# Patient Record
Sex: Female | Born: 1945
Health system: Southern US, Community
[De-identification: ages and names within clinical notes are randomized; demographics above are authoritative.]

## PROBLEM LIST (undated history)

## (undated) ENCOUNTER — Inpatient Hospital Stay (HOSPITAL_COMMUNITY)

## (undated) DIAGNOSIS — Z860100 Personal history of colon polyps, unspecified: Secondary | ICD-10-CM

## (undated) DIAGNOSIS — K828 Other specified diseases of gallbladder: Secondary | ICD-10-CM

## (undated) DIAGNOSIS — Z5189 Encounter for other specified aftercare: Secondary | ICD-10-CM

## (undated) DIAGNOSIS — K589 Irritable bowel syndrome without diarrhea: Secondary | ICD-10-CM

## (undated) DIAGNOSIS — I700 Atherosclerosis of aorta: Secondary | ICD-10-CM

## (undated) DIAGNOSIS — F32A Depression, unspecified: Secondary | ICD-10-CM

## (undated) DIAGNOSIS — F329 Major depressive disorder, single episode, unspecified: Secondary | ICD-10-CM

## (undated) DIAGNOSIS — G43909 Migraine, unspecified, not intractable, without status migrainosus: Secondary | ICD-10-CM

## (undated) DIAGNOSIS — K648 Other hemorrhoids: Secondary | ICD-10-CM

## (undated) DIAGNOSIS — F419 Anxiety disorder, unspecified: Secondary | ICD-10-CM

## (undated) DIAGNOSIS — K219 Gastro-esophageal reflux disease without esophagitis: Secondary | ICD-10-CM

## (undated) DIAGNOSIS — R51 Headache: Secondary | ICD-10-CM

## (undated) DIAGNOSIS — T4145XA Adverse effect of unspecified anesthetic, initial encounter: Secondary | ICD-10-CM

## (undated) HISTORY — DX: Other specified diseases of gallbladder: K82.8

## (undated) HISTORY — DX: Depression, unspecified: F32.A

## (undated) HISTORY — PX: CHOLECYSTECTOMY: SHX55

## (undated) HISTORY — DX: Atherosclerosis of aorta: I70.0

## (undated) HISTORY — PX: MASTECTOMY: SHX3

## (undated) HISTORY — DX: Migraine, unspecified, not intractable, without status migrainosus: G43.909

## (undated) HISTORY — DX: Other hemorrhoids: K64.8

## (undated) HISTORY — PX: ROTATOR CUFF REPAIR: SHX139

## (undated) HISTORY — DX: Irritable bowel syndrome, unspecified: K58.9

## (undated) HISTORY — PX: OTHER SURGICAL HISTORY: SHX169

## (undated) HISTORY — PX: CATARACT EXTRACTION: SUR2

## (undated) HISTORY — DX: Personal history of colon polyps, unspecified: Z86.0100

## (undated) HISTORY — DX: Anxiety disorder, unspecified: F41.9

## (undated) HISTORY — PX: OVARIAN CYST REMOVAL: SHX89

## (undated) HISTORY — DX: Major depressive disorder, single episode, unspecified: F32.9

## (undated) HISTORY — PX: AUGMENTATION MAMMAPLASTY: SUR837

## (undated) HISTORY — PX: BREAST SURGERY: SHX581

## (undated) HISTORY — PX: EYE MUSCLE SURGERY: SHX370

## (undated) HISTORY — DX: Personal history of colonic polyps: Z86.010

---
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
## 1982-11-08 HISTORY — PX: ABDOMINAL HYSTERECTOMY: SHX81

## 1999-03-04 ENCOUNTER — Other Ambulatory Visit: Admission: RE | Admit: 1999-03-04 | Discharge: 1999-03-04 | Payer: Self-pay | Admitting: Obstetrics and Gynecology

## 2000-03-07 ENCOUNTER — Other Ambulatory Visit: Admission: RE | Admit: 2000-03-07 | Discharge: 2000-03-07 | Payer: Self-pay | Admitting: Obstetrics and Gynecology

## 2000-03-16 ENCOUNTER — Encounter: Admission: RE | Admit: 2000-03-16 | Discharge: 2000-03-16 | Payer: Self-pay | Admitting: Plastic Surgery

## 2000-03-16 ENCOUNTER — Encounter: Payer: Self-pay | Admitting: Plastic Surgery

## 2000-07-12 ENCOUNTER — Encounter (INDEPENDENT_AMBULATORY_CARE_PROVIDER_SITE_OTHER): Payer: Self-pay

## 2000-07-12 ENCOUNTER — Other Ambulatory Visit: Admission: RE | Admit: 2000-07-12 | Discharge: 2000-07-12 | Payer: Self-pay | Admitting: Plastic Surgery

## 2001-03-13 ENCOUNTER — Other Ambulatory Visit: Admission: RE | Admit: 2001-03-13 | Discharge: 2001-03-13 | Payer: Self-pay | Admitting: Obstetrics and Gynecology

## 2001-03-20 ENCOUNTER — Encounter: Payer: Self-pay | Admitting: Obstetrics and Gynecology

## 2001-03-20 ENCOUNTER — Encounter: Admission: RE | Admit: 2001-03-20 | Discharge: 2001-03-20 | Payer: Self-pay | Admitting: Obstetrics and Gynecology

## 2001-04-13 ENCOUNTER — Encounter: Admission: RE | Admit: 2001-04-13 | Discharge: 2001-04-13 | Payer: Self-pay | Admitting: Obstetrics and Gynecology

## 2001-04-13 ENCOUNTER — Encounter: Payer: Self-pay | Admitting: Obstetrics and Gynecology

## 2001-04-26 ENCOUNTER — Encounter: Admission: RE | Admit: 2001-04-26 | Discharge: 2001-04-26 | Payer: Self-pay | Admitting: Surgery

## 2001-04-26 ENCOUNTER — Encounter: Payer: Self-pay | Admitting: Surgery

## 2001-05-04 ENCOUNTER — Encounter: Admission: RE | Admit: 2001-05-04 | Discharge: 2001-05-04 | Payer: Self-pay | Admitting: Surgery

## 2001-05-04 ENCOUNTER — Encounter: Payer: Self-pay | Admitting: Surgery

## 2002-06-18 ENCOUNTER — Encounter: Admission: RE | Admit: 2002-06-18 | Discharge: 2002-06-18 | Payer: Self-pay | Admitting: Obstetrics and Gynecology

## 2002-06-18 ENCOUNTER — Encounter: Payer: Self-pay | Admitting: Obstetrics and Gynecology

## 2003-05-20 ENCOUNTER — Encounter: Payer: Self-pay | Admitting: Obstetrics and Gynecology

## 2003-05-20 ENCOUNTER — Encounter: Admission: RE | Admit: 2003-05-20 | Discharge: 2003-05-20 | Payer: Self-pay | Admitting: Obstetrics and Gynecology

## 2003-08-12 ENCOUNTER — Encounter: Admission: RE | Admit: 2003-08-12 | Discharge: 2003-08-12 | Payer: Self-pay | Admitting: Obstetrics and Gynecology

## 2003-08-12 ENCOUNTER — Encounter: Payer: Self-pay | Admitting: Obstetrics and Gynecology

## 2004-08-17 ENCOUNTER — Encounter: Admission: RE | Admit: 2004-08-17 | Discharge: 2004-08-17 | Payer: Self-pay | Admitting: Obstetrics and Gynecology

## 2004-11-08 DIAGNOSIS — T8859XA Other complications of anesthesia, initial encounter: Secondary | ICD-10-CM

## 2004-11-08 HISTORY — DX: Other complications of anesthesia, initial encounter: T88.59XA

## 2004-11-08 HISTORY — PX: KNEE ARTHROPLASTY: SHX992

## 2005-08-23 ENCOUNTER — Encounter: Admission: RE | Admit: 2005-08-23 | Discharge: 2005-08-23 | Payer: Self-pay | Admitting: Plastic Surgery

## 2006-05-27 ENCOUNTER — Encounter: Admission: RE | Admit: 2006-05-27 | Discharge: 2006-05-27 | Payer: Self-pay | Admitting: Plastic Surgery

## 2007-07-12 ENCOUNTER — Encounter: Admission: RE | Admit: 2007-07-12 | Discharge: 2007-07-12 | Payer: Self-pay | Admitting: Plastic Surgery

## 2008-07-16 ENCOUNTER — Encounter: Admission: RE | Admit: 2008-07-16 | Discharge: 2008-07-16 | Payer: Self-pay | Admitting: Obstetrics and Gynecology

## 2009-07-23 ENCOUNTER — Encounter: Admission: RE | Admit: 2009-07-23 | Discharge: 2009-07-23 | Payer: Self-pay | Admitting: Specialist

## 2009-08-22 ENCOUNTER — Encounter: Admission: RE | Admit: 2009-08-22 | Discharge: 2009-08-22 | Payer: Self-pay | Admitting: Plastic Surgery

## 2010-08-12 ENCOUNTER — Encounter: Admission: RE | Admit: 2010-08-12 | Discharge: 2010-08-12 | Payer: Self-pay | Admitting: Obstetrics and Gynecology

## 2010-12-30 HISTORY — PX: HERNIA REPAIR: SHX51

## 2011-06-10 ENCOUNTER — Encounter (HOSPITAL_COMMUNITY): Payer: Self-pay

## 2011-06-10 ENCOUNTER — Other Ambulatory Visit: Payer: Self-pay

## 2011-06-10 ENCOUNTER — Encounter (HOSPITAL_COMMUNITY)
Admission: RE | Admit: 2011-06-10 | Discharge: 2011-06-10 | Disposition: A | Discharge status: Home / Self Care | Source: Ambulatory Visit | Attending: Obstetrics and Gynecology | Admitting: Obstetrics and Gynecology

## 2011-06-10 HISTORY — DX: Adverse effect of unspecified anesthetic, initial encounter: T41.45XA

## 2011-06-10 HISTORY — DX: Gastro-esophageal reflux disease without esophagitis: K21.9

## 2011-06-10 HISTORY — DX: Encounter for other specified aftercare: Z51.89

## 2011-06-10 LAB — CBC
HCT: 39.5 % (ref 36.0–46.0)
Hemoglobin: 12.7 g/dL (ref 12.0–15.0)
MCH: 30.8 pg (ref 26.0–34.0)
MCHC: 32.2 g/dL (ref 30.0–36.0)
MCV: 95.6 fL (ref 78.0–100.0)
Platelets: 170 10*3/uL (ref 150–400)
RBC: 4.13 MIL/uL (ref 3.87–5.11)
RDW: 12.7 % (ref 11.5–15.5)
WBC: 3.9 10*3/uL — ABNORMAL LOW (ref 4.0–10.5)

## 2011-06-10 NOTE — Anesthesia Preprocedure Evaluation (Addendum)
Anesthesia Evaluation  Name, MR# and DOB Patient awake  General Assessment Comment  Reviewed: Allergy & Precautions, H&P , Patient's Chart, lab work & pertinent test results  History of Anesthesia Complications History of anesthetic complications: Hypotenstion during procedure attributed to "too much anesthesia"  Airway Mallampati: II TM Distance: >3 FB Neck ROM: Full    Dental No notable dental hx. (+) Upper Dentures and Lower Dentures   Pulmonary  clear to auscultation  pulmonary exam normalPulmonary Exam Normal breath sounds clear to auscultation none    Cardiovascular Regular Normal    Neuro/Psych Negative Neurological ROS  Negative Psych ROS  GI/Hepatic/Renal negative GI ROS, negative Liver ROS, and negative Renal ROS (+)  GERD Controlled     Endo/Other  Negative Endocrine ROS (+)      Abdominal Normal abdominal exam  (+)   Musculoskeletal negative musculoskeletal ROS (+)   Hematology negative hematology ROS (+)   Peds  Reproductive/Obstetrics negative OB ROS    Anesthesia Other Findings             Anesthesia Physical Anesthesia Plan  ASA: II  Anesthesia Plan: General   Post-op Pain Management:    Induction: Intravenous  Airway Management Planned: Oral ETT  Additional Equipment:   Intra-op Plan:   Post-operative Plan: Extubation in OR  Informed Consent: I have reviewed the patients History and Physical, chart, labs and discussed the procedure including the risks, benefits and alternatives for the proposed anesthesia with the patient or authorized representative who has indicated his/her understanding and acceptance.   Dental advisory given  Plan Discussed with: Anesthesiologist (AP)  Anesthesia Plan Comments:         Anesthesia Quick Evaluation

## 2011-06-10 NOTE — Patient Instructions (Addendum)
20 TILIA FASO  06/10/2011   Your procedure is scheduled on:  06/23/11  Report to [facility] at 7 AM.  Call this number if you have problems the morning of surgery: [phone]   Remember:   Do not eat food:After Midnight.  Do not drink clear liquids: After Midnight.  Take these medicines the morning of surgery with A SIP OF WATER: Previcid    Do not wear jewelry, make-up or nail polish.  Do not wear lotions, powders, or perfumes. You may wear deodorant.  Do not shave 48 hours prior to surgery.  Do not bring valuables to the hospital.  Contacts, dentures or bridgework may not be worn into surgery.  Leave suitcase in the car. After surgery it may be brought to your room.  For patients admitted to the hospital, checkout time is 11:00 AM the day of discharge.   Patients discharged the day of surgery will not be allowed to drive home.  Name and phone number of your driver: NA  Special Instructions: use CHG wash per written instruction sheet.   Please read over the following fact sheets that you were given: After Surgery Info.

## 2011-06-22 MED ORDER — CLINDAMYCIN PHOSPHATE 900 MG/50ML IV SOLN
900.0000 mg | INTRAVENOUS | Status: DC
  Filled 2011-06-22: qty 50

## 2011-06-22 MED ORDER — CIPROFLOXACIN IN D5W 400 MG/200ML IV SOLN
400.0000 mg | INTRAVENOUS | Status: DC
  Filled 2011-06-22: qty 200

## 2011-06-22 NOTE — H&P (Signed)
65 y.o. yo complains of SUI and symptomatic cystocele.  The vaginal prolapse is recurrent.  She desires definitve therapy.  Past Medical History  Diagnosis Date  . Complication of anesthesia 2006    Drop in  Bp intra-op with Lap Chole. [employer].  Marland Kitchen GERD (gastroesophageal reflux disease)   . Blood transfusion without reported diagnosis 1984 x7    [facility]  . Blood transfusion     Blood bank notified Shelly Flatten)   Past Surgical History  Procedure Date  . Ovarian cyst removal     bilateral  . Cholecystectomy   . Rotator cuff repair   . Hernia repair   . Eye muscle surgery as child  . Appendectomy 1967  . Breast surgery     implants due to removal of breast tissue  . Abdominal hysterectomy 1984  . Knee arthroplasty 2006  Left inguinal hernia repair:  Pt had a surgimesh graft placed in the internal inguinal ring and then an onlay over this.      History   Social History  . Marital Status: Married    Spouse Name: N/A    Number of Children: N/A  . Years of Education: N/A   Occupational History  . Not on file.   Social History Main Topics  . Smoking status: Former Games developer  . Smokeless tobacco: Never Used  . Alcohol Use: No  . Drug Use: No  . Sexually Active:    Other Topics Concern  . Not on file   Social History Narrative  . No narrative on file    No current facility-administered medications on file prior to encounter.   No current outpatient prescriptions on file prior to encounter.    Allergies  Allergen Reactions  . Codeine Nausea Only    @VITALS2 @  Lungs: clear to ascultation Cor:  RRR Abdomen:  soft, nontender, nondistended. Ex:  no cords, erythema Pelvic:  Atrophic, vag vault to -3 with decensus.  Cystocele to introitus and beyond to +1.  No masses or rectocele.  Cystometrics:  nml cap, nml flow, VLPP 129 cm H20, detrusor stable.  A:  65 yo with genuine stress incontinence and symptomatic recurrent cystocele.  P:  She desires definitive  therapy with a TVT, cystoscopy, A-repair and possible Xeroform graft.   All risks, benefits and alternatives d/w patient and she will receive preop antibiotics and SCDs during the operation.     Cove Haydon A

## 2011-06-23 ENCOUNTER — Encounter (HOSPITAL_COMMUNITY): Payer: Self-pay | Admitting: Anesthesiology

## 2011-06-23 ENCOUNTER — Encounter (HOSPITAL_COMMUNITY)
Admission: AD | Disposition: A | Discharge status: Home / Self Care | Payer: Self-pay | Source: Ambulatory Visit | Attending: Obstetrics and Gynecology

## 2011-06-23 ENCOUNTER — Ambulatory Visit (HOSPITAL_COMMUNITY): Admitting: Anesthesiology

## 2011-06-23 ENCOUNTER — Ambulatory Visit (HOSPITAL_COMMUNITY)
Admission: AD | Admit: 2011-06-23 | Discharge: 2011-06-24 | Disposition: A | Discharge status: Home / Self Care | Source: Ambulatory Visit | Attending: Obstetrics and Gynecology | Admitting: Obstetrics and Gynecology

## 2011-06-23 DIAGNOSIS — N393 Stress incontinence (female) (male): Secondary | ICD-10-CM | POA: Insufficient documentation

## 2011-06-23 DIAGNOSIS — Z01812 Encounter for preprocedural laboratory examination: Secondary | ICD-10-CM | POA: Insufficient documentation

## 2011-06-23 DIAGNOSIS — Z01818 Encounter for other preprocedural examination: Secondary | ICD-10-CM | POA: Insufficient documentation

## 2011-06-23 DIAGNOSIS — N993 Prolapse of vaginal vault after hysterectomy: Secondary | ICD-10-CM | POA: Insufficient documentation

## 2011-06-23 HISTORY — PX: CYSTOCELE REPAIR: SHX163

## 2011-06-23 HISTORY — PX: CYSTOSCOPY: SHX5120

## 2011-06-23 HISTORY — PX: BLADDER SUSPENSION: SHX72

## 2011-06-23 SURGERY — URETHROPEXY, USING TRANSVAGINAL TAPE
Anesthesia: General | Site: Vagina | Wound class: Clean Contaminated

## 2011-06-23 MED ORDER — MENTHOL 3 MG MT LOZG: 1.0000 | LOZENGE | OROMUCOSAL | Status: DC | PRN

## 2011-06-23 MED ORDER — VENLAFAXINE HCL ER 150 MG PO CP24
150.0000 mg | ORAL_CAPSULE | ORAL | Status: DC
  Administered 2011-06-24: 150 mg via ORAL
  Filled 2011-06-23 (×3): qty 1

## 2011-06-23 MED ORDER — GLYCOPYRROLATE 0.2 MG/ML IJ SOLN
INTRAMUSCULAR | Status: AC
  Filled 2011-06-23: qty 1

## 2011-06-23 MED ORDER — SODIUM CHLORIDE 0.9 % IV SOLN
INTRAVENOUS | Status: DC | PRN
  Administered 2011-06-23: 08:00:00 via INTRAVENOUS

## 2011-06-23 MED ORDER — FENTANYL CITRATE 0.05 MG/ML IJ SOLN
INTRAMUSCULAR | Status: AC
  Administered 2011-06-23: 25 ug via INTRAVENOUS
  Filled 2011-06-23: qty 2

## 2011-06-23 MED ORDER — DEXTROSE IN LACTATED RINGERS 5 % IV SOLN
INTRAVENOUS | Status: DC | PRN
  Administered 2011-06-23: 10:00:00 via INTRAVENOUS

## 2011-06-23 MED ORDER — DEXAMETHASONE SODIUM PHOSPHATE 4 MG/ML IJ SOLN
INTRAMUSCULAR | Status: DC | PRN
  Administered 2011-06-23: 10 mg via INTRAVENOUS

## 2011-06-23 MED ORDER — INDIGOTINDISULFONATE SODIUM 8 MG/ML IJ SOLN
INTRAMUSCULAR | Status: DC | PRN
  Administered 2011-06-23: 4 mg via INTRAVENOUS

## 2011-06-23 MED ORDER — ROCURONIUM BROMIDE 100 MG/10ML IV SOLN
INTRAVENOUS | Status: DC | PRN
  Administered 2011-06-23: 35 mg via INTRAVENOUS

## 2011-06-23 MED ORDER — SODIUM CHLORIDE 0.9 % IV SOLN
250.0000 mL | INTRAVENOUS | Status: DC
  Administered 2011-06-23: 250 mL via INTRAVENOUS

## 2011-06-23 MED ORDER — STERILE WATER FOR IRRIGATION IR SOLN
Status: DC | PRN
  Administered 2011-06-23: 1000 mL via INTRAVESICAL

## 2011-06-23 MED ORDER — LACTATED RINGERS IV SOLN
INTRAVENOUS | Status: DC | PRN
  Administered 2011-06-23 (×2): via INTRAVENOUS

## 2011-06-23 MED ORDER — EPHEDRINE 5 MG/ML INJ
INTRAVENOUS | Status: AC
  Filled 2011-06-23: qty 10

## 2011-06-23 MED ORDER — FENTANYL CITRATE 0.05 MG/ML IJ SOLN
INTRAMUSCULAR | Status: DC | PRN
  Administered 2011-06-23: 100 ug via INTRAVENOUS
  Administered 2011-06-23: 50 ug via INTRAVENOUS

## 2011-06-23 MED ORDER — LACTATED RINGERS IV SOLN
INTRAVENOUS | Status: DC
  Administered 2011-06-23: 08:00:00 via INTRAVENOUS

## 2011-06-23 MED ORDER — METOCLOPRAMIDE HCL 5 MG/ML IJ SOLN
10.0000 mg | Freq: Once | INTRAMUSCULAR | Status: AC | PRN
  Administered 2011-06-23: 10 mg via INTRAVENOUS

## 2011-06-23 MED ORDER — METOCLOPRAMIDE HCL 5 MG/ML IJ SOLN
INTRAMUSCULAR | Status: AC
  Administered 2011-06-23: 10 mg via INTRAVENOUS
  Filled 2011-06-23: qty 2

## 2011-06-23 MED ORDER — ONDANSETRON HCL 4 MG/2ML IJ SOLN
INTRAMUSCULAR | Status: DC | PRN
  Administered 2011-06-23: 4 mg via INTRAVENOUS

## 2011-06-23 MED ORDER — DEXAMETHASONE SODIUM PHOSPHATE 10 MG/ML IJ SOLN
INTRAMUSCULAR | Status: AC
  Filled 2011-06-23: qty 1

## 2011-06-23 MED ORDER — LIDOCAINE HCL (CARDIAC) 20 MG/ML IV SOLN
INTRAVENOUS | Status: AC
  Filled 2011-06-23: qty 5

## 2011-06-23 MED ORDER — PHENAZOPYRIDINE HCL 100 MG PO TABS
200.0000 mg | ORAL_TABLET | Freq: Three times a day (TID) | ORAL | Status: DC | PRN
Start: 2011-06-23 — End: 2011-06-24
  Administered 2011-06-23 – 2011-06-24 (×3): 200 mg via ORAL
  Filled 2011-06-23: qty 2

## 2011-06-23 MED ORDER — CLINDAMYCIN PHOSPHATE 600 MG/50ML IV SOLN
INTRAVENOUS | Status: DC | PRN
  Administered 2011-06-23: 900 mg via INTRAVENOUS

## 2011-06-23 MED ORDER — IBUPROFEN 800 MG PO TABS
800.0000 mg | ORAL_TABLET | Freq: Three times a day (TID) | ORAL | Status: DC | PRN
  Administered 2011-06-24: 800 mg via ORAL
  Filled 2011-06-23: qty 1

## 2011-06-23 MED ORDER — SODIUM CHLORIDE 0.9 % IJ SOLN: 3.0000 mL | Freq: Two times a day (BID) | INTRAMUSCULAR | Status: DC

## 2011-06-23 MED ORDER — ONDANSETRON HCL 4 MG/2ML IJ SOLN
INTRAMUSCULAR | Status: AC
  Filled 2011-06-23: qty 2

## 2011-06-23 MED ORDER — OXYCODONE-ACETAMINOPHEN 5-325 MG PO TABS
1.0000 | ORAL_TABLET | ORAL | Status: DC | PRN
  Administered 2011-06-24: 1 via ORAL
  Administered 2011-06-24: 2 via ORAL
  Filled 2011-06-23: qty 1
  Filled 2011-06-23: qty 2

## 2011-06-23 MED ORDER — CIPROFLOXACIN IN D5W 400 MG/200ML IV SOLN
INTRAVENOUS | Status: DC | PRN
  Administered 2011-06-23: 400 mg via INTRAVENOUS

## 2011-06-23 MED ORDER — PANTOPRAZOLE SODIUM 40 MG PO TBEC
40.0000 mg | DELAYED_RELEASE_TABLET | Freq: Every day | ORAL | Status: DC
  Administered 2011-06-24: 40 mg via ORAL
  Filled 2011-06-23 (×3): qty 1

## 2011-06-23 MED ORDER — SODIUM CHLORIDE 0.9 % IV SOLN: INTRAVENOUS | Status: DC

## 2011-06-23 MED ORDER — ROCURONIUM BROMIDE 50 MG/5ML IV SOLN
INTRAVENOUS | Status: AC
  Filled 2011-06-23: qty 1

## 2011-06-23 MED ORDER — EPHEDRINE SULFATE 50 MG/ML IJ SOLN
INTRAMUSCULAR | Status: DC | PRN
  Administered 2011-06-23 (×2): 10 mg via INTRAVENOUS

## 2011-06-23 MED ORDER — FENTANYL CITRATE 0.05 MG/ML IJ SOLN
INTRAMUSCULAR | Status: AC
  Filled 2011-06-23: qty 5

## 2011-06-23 MED ORDER — ONDANSETRON HCL 4 MG PO TABS: 4.0000 mg | ORAL_TABLET | Freq: Four times a day (QID) | ORAL | Status: DC | PRN

## 2011-06-23 MED ORDER — ESTRADIOL 0.1 MG/GM VA CREA
TOPICAL_CREAM | VAGINAL | Status: DC | PRN
  Administered 2011-06-23: 1 via VAGINAL

## 2011-06-23 MED ORDER — MIDAZOLAM HCL 2 MG/2ML IJ SOLN
INTRAMUSCULAR | Status: AC
  Filled 2011-06-23: qty 2

## 2011-06-23 MED ORDER — PROPOFOL 10 MG/ML IV EMUL
INTRAVENOUS | Status: DC | PRN
  Administered 2011-06-23: 150 mg via INTRAVENOUS

## 2011-06-23 MED ORDER — PROPOFOL 10 MG/ML IV EMUL
INTRAVENOUS | Status: AC
  Filled 2011-06-23: qty 20

## 2011-06-23 MED ORDER — LIDOCAINE HCL (CARDIAC) 20 MG/ML IV SOLN
INTRAVENOUS | Status: DC | PRN
  Administered 2011-06-23 (×2): 30 mg via INTRAVENOUS

## 2011-06-23 MED ORDER — ZOLPIDEM TARTRATE 5 MG PO TABS: 5.0000 mg | ORAL_TABLET | Freq: Every evening | ORAL | Status: DC | PRN

## 2011-06-23 MED ORDER — LIDOCAINE-EPINEPHRINE 0.5-1:200000 % IJ SOLN
INTRAMUSCULAR | Status: DC | PRN
  Administered 2011-06-23: 4 mL via INTRADERMAL

## 2011-06-23 MED ORDER — SODIUM CHLORIDE 0.9 % IV SOLN: 250.0000 mL | INTRAVENOUS | Status: DC

## 2011-06-23 MED ORDER — MEPERIDINE HCL 25 MG/ML IJ SOLN: 6.2500 mg | INTRAMUSCULAR | Status: DC | PRN

## 2011-06-23 MED ORDER — FENTANYL CITRATE 0.05 MG/ML IJ SOLN
25.0000 ug | INTRAMUSCULAR | Status: DC | PRN
  Administered 2011-06-23 (×3): 25 ug via INTRAVENOUS

## 2011-06-23 MED ORDER — NEOSTIGMINE METHYLSULFATE 1 MG/ML IJ SOLN
INTRAMUSCULAR | Status: AC
  Filled 2011-06-23: qty 10

## 2011-06-23 MED ORDER — GLYCOPYRROLATE 0.2 MG/ML IJ SOLN
INTRAMUSCULAR | Status: DC | PRN
  Administered 2011-06-23: 0.3 mg via INTRAVENOUS

## 2011-06-23 MED ORDER — ONDANSETRON HCL 4 MG/2ML IJ SOLN: 4.0000 mg | Freq: Four times a day (QID) | INTRAMUSCULAR | Status: DC | PRN

## 2011-06-23 MED ORDER — MIDAZOLAM HCL 5 MG/5ML IJ SOLN
INTRAMUSCULAR | Status: DC | PRN
  Administered 2011-06-23: 2 mg via INTRAVENOUS

## 2011-06-23 MED ORDER — KETOROLAC TROMETHAMINE 30 MG/ML IJ SOLN
30.0000 mg | Freq: Four times a day (QID) | INTRAMUSCULAR | Status: DC
  Administered 2011-06-23 (×2): 30 mg via INTRAVENOUS
  Filled 2011-06-23 (×3): qty 1

## 2011-06-23 MED ORDER — NEOSTIGMINE METHYLSULFATE 1 MG/ML IJ SOLN
INTRAMUSCULAR | Status: DC | PRN
  Administered 2011-06-23: 4 mg via INTRAMUSCULAR

## 2011-06-23 SURGICAL SUPPLY — 36 items
BLADE SURG 15 STRL LF C SS BP (BLADE) ×2 IMPLANT
BLADE SURG 15 STRL SS (BLADE) ×3
CANISTER SUCTION 2500CC (MISCELLANEOUS) ×3 IMPLANT
CATH BONANNO SUPRAPUBIC 14G (CATHETERS) IMPLANT
CLOTH BEACON ORANGE TIMEOUT ST (SAFETY) ×3 IMPLANT
CONT PATH 16OZ SNAP LID 3702 (MISCELLANEOUS) IMPLANT
DECANTER SPIKE VIAL GLASS SM (MISCELLANEOUS) IMPLANT
DERMABOND ADVANCED (GAUZE/BANDAGES/DRESSINGS) ×3 IMPLANT
DRAPE CAMERA CLOSED 9X96 (DRAPES) ×3 IMPLANT
DRAPE UTILITY XL STRL (DRAPES) ×3 IMPLANT
GAUZE PACKING 2X5 YD STERILE (GAUZE/BANDAGES/DRESSINGS) ×3 IMPLANT
GLOVE BIO SURGEON STRL SZ7 (GLOVE) ×6 IMPLANT
GOWN PREVENTION PLUS LG XLONG (DISPOSABLE) ×12 IMPLANT
NDL SPNL 22GX3.5 QUINCKE BK (NEEDLE) IMPLANT
NEEDLE HYPO 22GX1.5 SAFETY (NEEDLE) ×3 IMPLANT
NEEDLE SPNL 22GX3.5 QUINCKE BK (NEEDLE) IMPLANT
NS IRRIG 1000ML POUR BTL (IV SOLUTION) ×3 IMPLANT
PACK VAGINAL WOMENS (CUSTOM PROCEDURE TRAY) ×3 IMPLANT
PLUG CATH AND CAP STER (CATHETERS) ×3 IMPLANT
SET CYSTO W/LG BORE CLAMP LF (SET/KITS/TRAYS/PACK) ×3 IMPLANT
SLING TRANS VAGINAL TAPE (Sling) ×1 IMPLANT
SLING UTERINE/ABD GYNECARE TVT (Sling) ×2 IMPLANT
SURGIFLO TRUKIT (HEMOSTASIS) ×1 IMPLANT
SUT VIC AB 0 CT1 18XCR BRD8 (SUTURE) ×2 IMPLANT
SUT VIC AB 0 CT1 27 (SUTURE) ×6
SUT VIC AB 0 CT1 27XBRD ANBCTR (SUTURE) ×4 IMPLANT
SUT VIC AB 0 CT1 8-18 (SUTURE) ×3
SUT VIC AB 2-0 CT1 27 (SUTURE) ×12
SUT VIC AB 2-0 CT1 TAPERPNT 27 (SUTURE) ×8 IMPLANT
SUT VIC AB 2-0 SH 27 (SUTURE) ×9
SUT VIC AB 2-0 SH 27XBRD (SUTURE) ×6 IMPLANT
SUT VIC AB 2-0 UR6 27 (SUTURE) IMPLANT
SYR 50ML LL SCALE MARK (SYRINGE) IMPLANT
TOWEL OR 17X24 6PK STRL BLUE (TOWEL DISPOSABLE) ×6 IMPLANT
TRAY FOLEY CATH 14FR (SET/KITS/TRAYS/PACK) ×3 IMPLANT
WATER STERILE IRR 1000ML POUR (IV SOLUTION) ×3 IMPLANT

## 2011-06-23 NOTE — Progress Notes (Signed)
Patient is eating, ambulating, not voiding-foley is in.  Pain control is good.  BP 91/74  Pulse 80  Temp(Src) 97.4 F (36.3 C) (Oral)  Resp 16  SpO2 95%  lungs:   clear to auscultation cor:    RRR Abdomen:  soft, appropriate tenderness, incisions intact and without erythema or exudate. ex:    no cords   Lab Results  Component Value Date   WBC 3.9* 06/10/2011   HGB 12.7 06/10/2011   HCT 39.5 06/10/2011   MCV 95.6 06/10/2011   PLT 170 06/10/2011    A/P  Routine care.  Expect d/c per plan.  Vag pack in o/n.  Keep foley in.

## 2011-06-23 NOTE — Brief Op Note (Signed)
06/23/2011  9:34 AM  PATIENT:  Rebecca [name]  65 y.o. female  PRE-OPERATIVE DIAGNOSIS:  stress urinary incontinence, cystocele  POST-OPERATIVE DIAGNOSIS:  stress urinary incontinence, cystocele  PROCEDURE:  Procedure(s): TRANSVAGINAL TAPE (TVT) PROCEDURE ANTERIOR REPAIR (CYSTOCELE) CYSTOSCOPY  SURGEON:  Surgeon(s): Marcelino Duster A Dixie Coppa Brook A Edward Jolly  PHYSICIAN ASSISTANT:   ASSISTANTSEdward Jolly   ANESTHESIA:   general  ESTIMATED BLOOD LOSS:  150 cc.  IVF:  1300 cc.  UOP:  50 cc.   BLOOD ADMINISTERED:none  LOCAL MEDICATIONS USED:  LIDOCAINE 10CC, Surgiflow  OTHER MEDICATIONS:  Indigo carmine  SPECIMEN:  No Specimen  DISPOSITION OF SPECIMEN:  N/A  COUNTS:  YES  FINDINGS:  Apex of vaginal well suspended and found no need for xeroform graft.  Cystocele to hymen (0 degree).  Good spill of indigo carmine from ureteral orifices.  No needles in bladder.  TECHNIQUE:  After general anesthesia was administered, the patient was prepped and draped in the usual sterile fashion.  A foley was placed in the urethra and the apex of the cystocele grasped with two allises.  Allises were used to grasp the vaginal mucosa in the midline superior to inferior just below the urethral opening.  The mucosa was injected with 1% lidocaine with epi in the midline and a scalpel used to incise the vaginal mucosa vertically.  Allises were used to retract the vaginal mucosa as the vesico-vaginal fascia was removed from the mucosa with blunt and sharp dissection.  Once adequate room was achieved, two stitches were placed on either side up into the corners near the pelvic floor to help with hemostasis.  Two small puncture incisions were then made two cm from midline just above the pubic symphysis.  The abdominal needles from the Gynecare TVT set were introduced behind the pubic bone, through the pelvic floor and out the vagina carefully on each respective side, being careful to hug the posterior of the pubic  bone.  The foley was removed and indigo carmine given.  Cystoscopy revealed excellent bilateral spill from each ureteral orifice and NO NEEDLES IN THE BLADDER.   The urethra was clear as well.  The foley was replaced and the vaginal needles attached to the abdominal needles.  The tape was pulled into place through the abdominal incisions, the sheaths removed while a kelly was in place under the mesh sling and the tape cut at the level of just below the skin.  The tape was checked and found to be tight enough and laying flat.  Surgiflow was placed in the corners up by the pelvic floor to achieve hemostasis of some small bleeders out of reach and too close to the sling for stitches.  Once hemostasis was achieved, two mattress stitches of 0-vicryl were placed to close the vesico-vaginal fascia under the bladder.  The vaginal mucosa was trimmed and then closed with a running lock stitch of 2-0 vicryl.  A vaginal packing with estrace was placed and the patient returned to the recovery room in stable condition.     PLAN OF CARE: To PACU in stable condition.  PATIENT DISPOSITION:  PACU - hemodynamically stable.   Delay start of Pharmacological VTE agent (>24hrs) due to surgical blood loss or risk of bleeding:  not applicable  Keashia Haskins A

## 2011-06-23 NOTE — Anesthesia Postprocedure Evaluation (Signed)
  Anesthesia Post-op Note  Patient: Rebecca [name]  Procedure(s) Performed:  TRANSVAGINAL TAPE (TVT) PROCEDURE; ANTERIOR REPAIR (CYSTOCELE); CYSTOSCOPY  Patient Location: PACU  Anesthesia Type: General  Level of Consciousness: awake, alert  and oriented  Airway and Oxygen Therapy: Patient Spontanous Breathing  Post-op Pain: none  Post-op Assessment: Post-op Vital signs reviewed, Patient's Cardiovascular Status Stable, Respiratory Function Stable, Patent Airway, No signs of Nausea or vomiting and Pain level controlled  Post-op Vital Signs: Reviewed  Complications: No apparent anesthesia complications

## 2011-06-23 NOTE — Preoperative (Signed)
Beta Blockers   Reason not to administer Beta Blockers:Not Applicable 

## 2011-06-23 NOTE — Anesthesia Procedure Notes (Addendum)
Performed by: Suella Grove   Procedure Name: Intubation Performed by: Suella Grove Pre-anesthesia Checklist: Patient identified, Patient being monitored, Emergency Drugs available, Timeout performed and Suction available Patient Re-evaluated:Patient Re-evaluated prior to inductionOxygen Delivery Method: Circle System Utilized Preoxygenation: Pre-oxygenation with 100% oxygen Ventilation: Mask ventilation without difficulty Laryngoscope Size: Mac and 3 Grade View: Grade I Tube type: Oral Tube size: 7.0 mm Number of attempts: 1 Placement Confirmation: ETT inserted through vocal cords under direct vision,  breath sounds checked- equal and bilateral and positive ETCO2 Secured at: 20 cm Tube secured with: Tape Dental Injury: Teeth and Oropharynx as per pre-operative assessment

## 2011-06-23 NOTE — Transfer of Care (Signed)
Immediate Anesthesia Transfer of Care Note  Patient: Rebecca [name]  Procedure(s) Performed:  TRANSVAGINAL TAPE (TVT) PROCEDURE; ANTERIOR REPAIR (CYSTOCELE); CYSTOSCOPY  Patient Location: PACU  Anesthesia Type: General  Level of Consciousness: awake, pateint uncooperative, confused and responds to stimulation  Airway & Oxygen Therapy: Patient Spontanous Breathing and Patient connected to nasal cannula oxygen  Post-op Assessment: Report given to PACU RN, Post -op Vital signs reviewed and stable and Patient moving all extremities  Post vital signs: Reviewed and stable  Complications: No apparent anesthesia complications

## 2011-06-23 NOTE — Progress Notes (Signed)
There has been no change in the patients history or status since the history and physical.  Filed Vitals:   06/23/11 0700  BP: 136/54  Pulse: 70  Temp: 98.1 F (36.7 C)  TempSrc: Oral  Resp: 18  SpO2: 100%    Lab Results  Component Value Date   WBC 3.9* 06/10/2011   HGB 12.7 06/10/2011   HCT 39.5 06/10/2011   MCV 95.6 06/10/2011   PLT 170 06/10/2011    Samone Guhl A

## 2011-06-24 LAB — CBC
HCT: 28.5 % — ABNORMAL LOW (ref 36.0–46.0)
Hemoglobin: 9.4 g/dL — ABNORMAL LOW (ref 12.0–15.0)
MCV: 94.7 fL (ref 78.0–100.0)
RBC: 3.01 MIL/uL — ABNORMAL LOW (ref 3.87–5.11)
WBC: 11.4 10*3/uL — ABNORMAL HIGH (ref 4.0–10.5)

## 2011-06-24 MED ORDER — OXYCODONE-ACETAMINOPHEN 5-325 MG PO TABS: 1.0000 | ORAL_TABLET | ORAL | Status: AC | PRN

## 2011-06-24 MED ORDER — CEFUROXIME AXETIL 250 MG PO TABS: 250.0000 mg | ORAL_TABLET | Freq: Two times a day (BID) | ORAL | Status: AC

## 2011-06-24 NOTE — Progress Notes (Addendum)
Patient is eating, ambulating, not voiding-foley to remain in.  Pain control is good.  BP 91/54  Pulse 81  Temp(Src) 98.1 F (36.7 C) (Oral)  Resp 18  SpO2 95%  lungs:   clear to auscultation cor:    RRR Abdomen:  soft, appropriate tenderness, incisions intact and without erythema or exudate. ex:    no cords  Vag Pack OUT  Lab Results  Component Value Date   WBC 11.4* 06/24/2011   HGB 9.4* 06/24/2011   HCT 28.5* 06/24/2011   MCV 94.7 06/24/2011   PLT 138* 06/24/2011    A/P  Routine care.  Expect d/c per plan.

## 2011-06-24 NOTE — Discharge Summary (Signed)
Physician Discharge Summary  Patient ID: Rebecca [name] MRN: [id number] DOB/AGE: 02/21/1946 65 y.o.  Admit date: 06/23/2011 Discharge date: 06/24/2011  Admission Diagnoses:  SUI, cystocele  Discharge Diagnoses: same   Discharged Condition: good  Hospital Course: uncomp post op.  Consults: none  Significant Diagnostic Studies: labs:  Lab Results  Component Value Date   WBC 11.4* 06/24/2011   HGB 9.4* 06/24/2011   HCT 28.5* 06/24/2011   MCV 94.7 06/24/2011   PLT 138* 06/24/2011    Treatments: surgery: TVT, A-repair, cystoscopy  Discharge Exam: Blood pressure 91/54, pulse 81, temperature 98.1 F (36.7 C), temperature source Oral, resp. rate 18, SpO2 95.00%. See progress note.  Disposition: to home.   Current Discharge Medication List    CONTINUE these medications which have NOT CHANGED   Details  lansoprazole (PREVACID) 30 MG capsule Take 30 mg by mouth every morning.      venlafaxine (EFFEXOR-XR) 150 MG 24 hr capsule Take 150 mg by mouth every morning.      Percocet for pain. Ceftin for indwelling catheter.     Signed: Wafa Martes A 06/24/2011, 8:36 AM

## 2011-06-25 NOTE — Op Note (Signed)
See op note under brief op note- this is complete op note.

## 2011-07-19 ENCOUNTER — Encounter (HOSPITAL_COMMUNITY): Payer: Self-pay | Admitting: Obstetrics and Gynecology

## 2011-07-27 ENCOUNTER — Other Ambulatory Visit: Payer: Self-pay | Admitting: Obstetrics and Gynecology

## 2011-07-27 DIAGNOSIS — Z1231 Encounter for screening mammogram for malignant neoplasm of breast: Secondary | ICD-10-CM

## 2011-08-20 ENCOUNTER — Ambulatory Visit
Admission: RE | Admit: 2011-08-20 | Discharge: 2011-08-20 | Disposition: A | Discharge status: Home / Self Care | Source: Ambulatory Visit | Attending: Obstetrics and Gynecology | Admitting: Obstetrics and Gynecology

## 2011-08-20 DIAGNOSIS — Z1231 Encounter for screening mammogram for malignant neoplasm of breast: Secondary | ICD-10-CM

## 2011-10-14 ENCOUNTER — Encounter (HOSPITAL_COMMUNITY): Payer: Self-pay | Admitting: *Deleted

## 2011-10-14 ENCOUNTER — Other Ambulatory Visit: Payer: Self-pay | Admitting: Obstetrics and Gynecology

## 2011-10-25 ENCOUNTER — Encounter (HOSPITAL_COMMUNITY): Payer: Self-pay

## 2011-11-09 MED ORDER — DEXTROSE 5 % IV SOLN
1.0000 g | INTRAVENOUS | Status: AC
  Administered 2011-11-10: 1 g via INTRAVENOUS
  Filled 2011-11-09: qty 1

## 2011-11-09 NOTE — H&P (Signed)
66 y.o. yo complains of new onset rectocele since TVT and Anterior repair in August of 2012.  Previously the patient only had a documented rectocele of -2 with no need for splinting or any other symptoms.  Since the TVT surgery, the patient has had an increase of her rectocele to 0 station and a persistent feeling of pressure in the rectum.  She does not appear to have an enterocele.  She desires surgical therapy for the rectocele.  Past Medical History  Diagnosis Date  . Blood transfusion without reported diagnosis 1984 x7    [facility]  . Blood transfusion     Blood bank notified Shelly Flatten)  . Complication of anesthesia 2006    Drop in  Bp intra-op with Lap Chole. [employer].  Marland Kitchen GERD (gastroesophageal reflux disease)   . Headache     migraines   Past Surgical History  Procedure Date  . Ovarian cyst removal     bilateral  . Cholecystectomy   . Rotator cuff repair   . Hernia repair   . Eye muscle surgery as child  . Appendectomy 1967  . Breast surgery     implants due to removal of breast tissue  . Abdominal hysterectomy 1984  . Knee arthroplasty 2006  . Bladder suspension 06/23/2011    Procedure: TRANSVAGINAL TAPE (TVT) PROCEDURE;  Surgeon: Loney Laurence;  Location: [facility];  Service: Gynecology;  Laterality: N/A;  . Cystocele repair 06/23/2011    Procedure: ANTERIOR REPAIR (CYSTOCELE);  Surgeon: Loney Laurence;  Location: [facility];  Service: Gynecology;  Laterality: N/A;  . Cystoscopy 06/23/2011    Procedure: CYSTOSCOPY;  Surgeon: Loney Laurence;  Location: [facility];  Service: Gynecology;  Laterality: N/A;    History   Social History  . Marital Status: Married    Spouse Name: N/A    Number of Children: N/A  . Years of Education: N/A   Occupational History  . Not on file.   Social History Main Topics  . Smoking status: Former Games developer  . Smokeless tobacco: Never Used  . Alcohol Use: No  . Drug Use: No  . Sexually Active:    Other Topics Concern  . Not on  file   Social History Narrative  . No narrative on file    No current facility-administered medications on file prior to encounter.   Current Outpatient Prescriptions on File Prior to Encounter  Medication Sig Dispense Refill  . lansoprazole (PREVACID) 30 MG capsule Take 30 mg by mouth every morning.        . venlafaxine (EFFEXOR-XR) 150 MG 24 hr capsule Take 150 mg by mouth every morning.          Allergies  Allergen Reactions  . Codeine Nausea Only   Vitals Pending  Lungs: clear to ascultation Cor:  RRR Abdomen:  soft, nontender, nondistended. Ex:  no cords, erythema Pelvic:  NEFG, normal vaginal vault with sustained anterior repair and intact TVT.  Rectocele now to 0 station above hymen and no enterocele noted.    A:  Symptomatic rectocele.   P:  For posterior repair.   All risks, benefits and alternatives d/w patient and she desires to proceed.  Patient will receive preop antibiotics and SCDs during the operation.     Satin Boal A

## 2011-11-10 ENCOUNTER — Ambulatory Visit (HOSPITAL_COMMUNITY)
Admission: RE | Admit: 2011-11-10 | Discharge: 2011-11-10 | Disposition: A | Discharge status: Home / Self Care | Source: Ambulatory Visit | Attending: Obstetrics and Gynecology | Admitting: Obstetrics and Gynecology

## 2011-11-10 ENCOUNTER — Encounter (HOSPITAL_COMMUNITY): Payer: Self-pay | Admitting: Anesthesiology

## 2011-11-10 ENCOUNTER — Encounter (HOSPITAL_COMMUNITY)
Admission: RE | Disposition: A | Discharge status: Home / Self Care | Payer: Self-pay | Source: Ambulatory Visit | Attending: Obstetrics and Gynecology

## 2011-11-10 ENCOUNTER — Encounter (HOSPITAL_COMMUNITY): Payer: Self-pay | Admitting: *Deleted

## 2011-11-10 ENCOUNTER — Ambulatory Visit (HOSPITAL_COMMUNITY): Admitting: Anesthesiology

## 2011-11-10 DIAGNOSIS — N993 Prolapse of vaginal vault after hysterectomy: Secondary | ICD-10-CM | POA: Insufficient documentation

## 2011-11-10 DIAGNOSIS — Z9889 Other specified postprocedural states: Secondary | ICD-10-CM

## 2011-11-10 DIAGNOSIS — Z01818 Encounter for other preprocedural examination: Secondary | ICD-10-CM | POA: Insufficient documentation

## 2011-11-10 DIAGNOSIS — Z01812 Encounter for preprocedural laboratory examination: Secondary | ICD-10-CM | POA: Insufficient documentation

## 2011-11-10 HISTORY — PX: RECTOCELE REPAIR: SHX761

## 2011-11-10 HISTORY — DX: Headache: R51

## 2011-11-10 LAB — CBC
MCH: 30.5 pg (ref 26.0–34.0)
MCV: 92.2 fL (ref 78.0–100.0)
Platelets: 177 10*3/uL (ref 150–400)
RDW: 13 % (ref 11.5–15.5)
WBC: 4.1 10*3/uL (ref 4.0–10.5)

## 2011-11-10 SURGERY — COLPORRHAPHY, POSTERIOR, FOR RECTOCELE REPAIR
Anesthesia: General | Site: Vagina | Wound class: Clean Contaminated

## 2011-11-10 MED ORDER — FENTANYL CITRATE 0.05 MG/ML IJ SOLN
INTRAMUSCULAR | Status: AC
  Filled 2011-11-10: qty 5

## 2011-11-10 MED ORDER — LIDOCAINE-EPINEPHRINE 0.5-1:200000 % IJ SOLN
INTRAMUSCULAR | Status: DC | PRN
  Administered 2011-11-10: 7 mL

## 2011-11-10 MED ORDER — LACTATED RINGERS IV SOLN
INTRAVENOUS | Status: DC
  Administered 2011-11-10 (×2): via INTRAVENOUS

## 2011-11-10 MED ORDER — GLYCOPYRROLATE 0.2 MG/ML IJ SOLN
INTRAMUSCULAR | Status: AC
  Filled 2011-11-10: qty 1

## 2011-11-10 MED ORDER — PROPOFOL 10 MG/ML IV EMUL
INTRAVENOUS | Status: AC
  Filled 2011-11-10: qty 20

## 2011-11-10 MED ORDER — MIDAZOLAM HCL 2 MG/2ML IJ SOLN
INTRAMUSCULAR | Status: AC
  Filled 2011-11-10: qty 2

## 2011-11-10 MED ORDER — OXYCODONE-ACETAMINOPHEN 5-325 MG PO TABS: 1.0000 | ORAL_TABLET | ORAL | Status: AC | PRN

## 2011-11-10 MED ORDER — IBUPROFEN 800 MG PO TABS: 800.0000 mg | ORAL_TABLET | Freq: Three times a day (TID) | ORAL | Status: DC | PRN

## 2011-11-10 MED ORDER — OXYCODONE-ACETAMINOPHEN 5-325 MG PO TABS: 1.0000 | ORAL_TABLET | ORAL | Status: DC | PRN

## 2011-11-10 MED ORDER — PROPOFOL 10 MG/ML IV EMUL
INTRAVENOUS | Status: DC | PRN
  Administered 2011-11-10: 100 mg via INTRAVENOUS
  Administered 2011-11-10: 20 mg via INTRAVENOUS

## 2011-11-10 MED ORDER — HYDROMORPHONE HCL PF 1 MG/ML IJ SOLN
INTRAMUSCULAR | Status: AC
  Filled 2011-11-10: qty 1

## 2011-11-10 MED ORDER — ONDANSETRON HCL 4 MG PO TABS: 4.0000 mg | ORAL_TABLET | Freq: Four times a day (QID) | ORAL | Status: DC | PRN

## 2011-11-10 MED ORDER — PANTOPRAZOLE SODIUM 20 MG PO TBEC
20.0000 mg | DELAYED_RELEASE_TABLET | Freq: Every day | ORAL | Status: DC
  Filled 2011-11-10 (×2): qty 1

## 2011-11-10 MED ORDER — ONDANSETRON HCL 4 MG/2ML IJ SOLN
INTRAMUSCULAR | Status: DC | PRN
  Administered 2011-11-10: 4 mg via INTRAVENOUS

## 2011-11-10 MED ORDER — ROCURONIUM BROMIDE 50 MG/5ML IV SOLN
INTRAVENOUS | Status: AC
  Filled 2011-11-10: qty 1

## 2011-11-10 MED ORDER — ZOLPIDEM TARTRATE 5 MG PO TABS: 5.0000 mg | ORAL_TABLET | Freq: Every evening | ORAL | Status: DC | PRN

## 2011-11-10 MED ORDER — KETOROLAC TROMETHAMINE 30 MG/ML IJ SOLN: 30.0000 mg | Freq: Four times a day (QID) | INTRAMUSCULAR | Status: DC

## 2011-11-10 MED ORDER — LIDOCAINE HCL (CARDIAC) 20 MG/ML IV SOLN
INTRAVENOUS | Status: DC | PRN
  Administered 2011-11-10: 50 mg via INTRAVENOUS

## 2011-11-10 MED ORDER — ESTRADIOL 0.1 MG/GM VA CREA
TOPICAL_CREAM | VAGINAL | Status: DC | PRN
  Administered 2011-11-10: 1 via VAGINAL

## 2011-11-10 MED ORDER — ONDANSETRON HCL 4 MG/2ML IJ SOLN: 4.0000 mg | Freq: Four times a day (QID) | INTRAMUSCULAR | Status: DC | PRN

## 2011-11-10 MED ORDER — GLYCOPYRROLATE 0.2 MG/ML IJ SOLN
INTRAMUSCULAR | Status: DC | PRN
  Administered 2011-11-10: 0.2 mg via INTRAVENOUS

## 2011-11-10 MED ORDER — HYDROMORPHONE HCL PF 1 MG/ML IJ SOLN
0.2500 mg | INTRAMUSCULAR | Status: DC | PRN
Start: 2011-11-10 — End: 2011-11-10
  Administered 2011-11-10 (×3): 0.5 mg via INTRAVENOUS

## 2011-11-10 MED ORDER — KETOROLAC TROMETHAMINE 30 MG/ML IJ SOLN
INTRAMUSCULAR | Status: AC
  Filled 2011-11-10: qty 1

## 2011-11-10 MED ORDER — ONDANSETRON HCL 4 MG/2ML IJ SOLN
INTRAMUSCULAR | Status: AC
  Filled 2011-11-10: qty 2

## 2011-11-10 MED ORDER — KETOROLAC TROMETHAMINE 30 MG/ML IJ SOLN
INTRAMUSCULAR | Status: DC | PRN
  Administered 2011-11-10: 30 mg via INTRAVENOUS

## 2011-11-10 MED ORDER — FENTANYL CITRATE 0.05 MG/ML IJ SOLN
INTRAMUSCULAR | Status: DC | PRN
  Administered 2011-11-10: 100 ug via INTRAVENOUS

## 2011-11-10 MED ORDER — DEXAMETHASONE SODIUM PHOSPHATE 4 MG/ML IJ SOLN
INTRAMUSCULAR | Status: DC | PRN
  Administered 2011-11-10: 10 mg via INTRAVENOUS

## 2011-11-10 MED ORDER — VENLAFAXINE HCL ER 150 MG PO CP24
150.0000 mg | ORAL_CAPSULE | ORAL | Status: DC
  Filled 2011-11-10 (×2): qty 1

## 2011-11-10 MED ORDER — MIDAZOLAM HCL 5 MG/5ML IJ SOLN
INTRAMUSCULAR | Status: DC | PRN
  Administered 2011-11-10: 1 mg via INTRAVENOUS

## 2011-11-10 MED ORDER — DEXAMETHASONE SODIUM PHOSPHATE 10 MG/ML IJ SOLN
INTRAMUSCULAR | Status: AC
  Filled 2011-11-10: qty 1

## 2011-11-10 MED ORDER — LIDOCAINE HCL (CARDIAC) 20 MG/ML IV SOLN
INTRAVENOUS | Status: AC
  Filled 2011-11-10: qty 5

## 2011-11-10 MED ORDER — MENTHOL 3 MG MT LOZG: 1.0000 | LOZENGE | OROMUCOSAL | Status: DC | PRN

## 2011-11-10 SURGICAL SUPPLY — 24 items
CANISTER SUCTION 2500CC (MISCELLANEOUS) ×3 IMPLANT
CATH ROBINSON RED A/P 16FR (CATHETERS) ×2 IMPLANT
CLOTH BEACON ORANGE TIMEOUT ST (SAFETY) ×3 IMPLANT
CONT PATH 16OZ SNAP LID 3702 (MISCELLANEOUS) IMPLANT
DECANTER SPIKE VIAL GLASS SM (MISCELLANEOUS) ×2 IMPLANT
DRAPE PROXIMA HALF (DRAPES) ×2 IMPLANT
GAUZE 2 PACKING MSP (GAUZE/BANDAGES/DRESSINGS) ×2 IMPLANT
GAUZE PACKING 2X5 YD STERILE (GAUZE/BANDAGES/DRESSINGS) IMPLANT
GLOVE BIO SURGEON STRL SZ7 (GLOVE) ×6 IMPLANT
GOWN PREVENTION PLUS LG XLONG (DISPOSABLE) ×12 IMPLANT
NDL SPNL 22GX3.5 QUINCKE BK (NEEDLE) IMPLANT
NEEDLE HYPO 22GX1.5 SAFETY (NEEDLE) ×3 IMPLANT
NEEDLE SPNL 22GX3.5 QUINCKE BK (NEEDLE) IMPLANT
NS IRRIG 1000ML POUR BTL (IV SOLUTION) ×3 IMPLANT
PACK VAGINAL WOMENS (CUSTOM PROCEDURE TRAY) ×3 IMPLANT
SUT VIC AB 0 CT1 18XCR BRD8 (SUTURE) ×2 IMPLANT
SUT VIC AB 0 CT1 8-18 (SUTURE) ×3
SUT VIC AB 2-0 CT1 27 (SUTURE) ×6
SUT VIC AB 2-0 CT1 TAPERPNT 27 (SUTURE) ×6 IMPLANT
SUT VIC AB 3-0 SH 27 (SUTURE) ×3
SUT VIC AB 3-0 SH 27X BRD (SUTURE) ×1 IMPLANT
TOWEL OR 17X24 6PK STRL BLUE (TOWEL DISPOSABLE) ×6 IMPLANT
TRAY FOLEY CATH 14FR (SET/KITS/TRAYS/PACK) ×1 IMPLANT
WATER STERILE IRR 1000ML POUR (IV SOLUTION) ×1 IMPLANT

## 2011-11-10 NOTE — Transfer of Care (Signed)
Immediate Anesthesia Transfer of Care Note  Patient: Rebecca [name]  Procedure(s) Performed:  POSTERIOR REPAIR (RECTOCELE)  Patient Location: PACU  Anesthesia Type: General  Level of Consciousness: awake and oriented  Airway & Oxygen Therapy: Patient Spontanous Breathing and Patient connected to nasal cannula oxygen  Post-op Assessment: Report given to PACU RN and Post -op Vital signs reviewed and stable  Post vital signs: Reviewed and stable  Complications: No apparent anesthesia complications

## 2011-11-10 NOTE — Anesthesia Procedure Notes (Signed)
Procedure Name: LMA Insertion Date/Time: 11/10/2011 7:31 AM Performed by: Isabella Bowens Pre-anesthesia Checklist: Patient identified, Emergency Drugs available, Suction available, Patient being monitored and Timeout performed Patient Re-evaluated:Patient Re-evaluated prior to inductionOxygen Delivery Method: Circle System Utilized Preoxygenation: Pre-oxygenation with 100% oxygen Intubation Type: IV induction LMA: LMA inserted LMA Size: 4.0 Grade View: Grade II Number of attempts: 1 Placement Confirmation: positive ETCO2 and breath sounds checked- equal and bilateral Dental Injury: Teeth and Oropharynx as per pre-operative assessment

## 2011-11-10 NOTE — Progress Notes (Signed)
Dr Henderson Cloud by to see pt, will admit for observation due to bleeding.

## 2011-11-10 NOTE — Op Note (Signed)
11/10/2011  8:18 AM  PATIENT:  Rebecca [name]  66 y.o. female  PRE-OPERATIVE DIAGNOSIS:  RECTOCELE  POST-OPERATIVE DIAGNOSIS:  RECTOCELE  PROCEDURE:  Procedure(s): POSTERIOR REPAIR (RECTOCELE)  SURGEON:  Surgeon(s): Natoya Viscomi A Kristel Durkee, MD Richard D Kaplan  ASSISTANTS: Dr. Kaplan   ANESTHESIA:   general  EBL:  Total I/O In: 1000 [I.V.:1000] Out: 150 [Urine:100; Blood:50]  LOCAL MEDICATIONS USED:  XYLOCAINE 10CC  SPECIMEN:  No Specimen  DISPOSITION OF SPECIMEN:  N/A  COUNTS:  YES  DICTATION: see below  PLAN OF CARE: Discharge to home after PACU  PATIENT DISPOSITION:  PACU - hemodynamically stable.   Delay start of Pharmacological VTE agent (>24hrs) due to surgical blood loss or risk of bleeding:  NOT APPLICABLE:20182  Complications: none  Meds:  0.5% xylocaine with epi  Technique:  After adequate anesthesia was achieved, the patient was prepped and draped in the usual fashion; the bladder was emptied with a red rubber catheter.  The perineum was grasped with allises and the midline was grasped all the way to the top of the rectocele with allises.  Xylocaine was injected submucosally and then incised in the midline vertically.  The mucosa was then retracted off the rectovaginal fascia with sharp and blunt dissection.  A small defect in the rectovaginal fascia over the fat outside the rectum was repaired with 3-0 vicryl; the rectum remained intact throughout the procedure and was checked with a gloved finger several times.  Three mattress stitches were then used to close the rectovaginal fascia over the defect.  The vaginal mucosa was then closed with a running lock stitch of 2-0 vicryl.  A vaginal pack was placed with estrogen cream.  The patient tolerated the procedure well and was returned to the PACU in stable condition.  

## 2011-11-10 NOTE — Anesthesia Postprocedure Evaluation (Signed)
Anesthesia Post Note  Patient: Rebecca [name]  Procedure(s) Performed:  POSTERIOR REPAIR (RECTOCELE)  Anesthesia type: General  Patient location: PACU  Post pain: Pain level controlled  Post assessment: Post-op Vital signs reviewed  Last Vitals:  Filed Vitals:   11/10/11 0915  BP: 114/54  Pulse: 83  Temp: 36.7 C  Resp: 19    Post vital signs: Reviewed  Level of consciousness: sedated  Complications: No apparent anesthesia complications

## 2011-11-10 NOTE — Progress Notes (Signed)
Pt up to the bathroom, unable to void at present. Rectal packing coming out, Dr Henderson Cloud made aware, will come by and see pt.  Pt with moderate amount of bleeding as well, Dr Henderson Cloud aware.

## 2011-11-10 NOTE — Progress Notes (Signed)
Patient is eating, ambulating, voiding.  Pain control is good.  Still having bright red bleeding from vagina.  BP 105/65  Pulse 80  Temp(Src) 97.7 F (36.5 C) (Oral)  Resp 18  Ht 5\' 2"  (1.575 m)  Wt 58.514 kg (129 lb)  BMI 23.59 kg/m2  SpO2 94%  lungs:   clear to auscultation cor:    RRR Abdomen:  soft, appropriate tenderness, incisions intact and without erythema or exudate. ex:    no cords  Vagina: only slight oozing from distal edge of repair.  Vag pack dry above.  Lab Results  Component Value Date   WBC 4.1 11/10/2011   HGB 13.3 11/10/2011   HCT 40.2 11/10/2011   MCV 92.2 11/10/2011   PLT 177 11/10/2011    A/P  Routine care.  Expect d/c per plan- will hold one more hour- if bleeding slows, can go home.

## 2011-11-10 NOTE — Progress Notes (Signed)
There has been no change in the patients history, status or exam since the history and physical.  Filed Vitals:   11/10/11 0609  BP: 125/54  Pulse: 78  Temp: 98.2 F (36.8 C)  TempSrc: Oral  Resp: 18  Height: 5\' 2"  (1.575 m)  Weight: 58.514 kg (129 lb)  SpO2: 100%    Lab Results  Component Value Date   WBC 4.1 11/10/2011   HGB 13.3 11/10/2011   HCT 40.2 11/10/2011   MCV 92.2 11/10/2011   PLT 177 11/10/2011    Rebecca [name] A

## 2011-11-10 NOTE — Anesthesia Preprocedure Evaluation (Addendum)
Anesthesia Evaluation  Patient identified by MRN, date of birth, ID band Patient awake    Reviewed: Allergy & Precautions, H&P , Patient's Chart, lab work & pertinent test results, reviewed documented beta blocker date and time   Airway Mallampati: II TM Distance: <3 FB Neck ROM: full    Dental No notable dental hx. (+) Upper Dentures   Pulmonary  clear to auscultation  Pulmonary exam normal       Cardiovascular regular Normal    Neuro/Psych    GI/Hepatic GERD-  Medicated and Controlled,  Endo/Other    Renal/GU      Musculoskeletal   Abdominal   Peds  Hematology   Anesthesia Other Findings   Reproductive/Obstetrics                        Anesthesia Physical Anesthesia Plan  ASA: II  Anesthesia Plan: General   Post-op Pain Management:    Induction: Intravenous  Airway Management Planned: Oral ETT  Additional Equipment:   Intra-op Plan:   Post-operative Plan:   Informed Consent: I have reviewed the patients History and Physical, chart, labs and discussed the procedure including the risks, benefits and alternatives for the proposed anesthesia with the patient or authorized representative who has indicated his/her understanding and acceptance.   Dental Advisory Given and Dental advisory given  Plan Discussed with: CRNA and Surgeon  Anesthesia Plan Comments: (  Discussed  general anesthesia, including possible nausea, instrumentation of airway, sore throat,pulmonary aspiration, etc. I asked if the were any outstanding questions, or  concerns before we proceeded. )        Anesthesia Quick Evaluation

## 2011-11-10 NOTE — Progress Notes (Signed)
Pt out in w/c teaching complete  Scant quarter size light pink vaginal drainage noted

## 2011-11-10 NOTE — Brief Op Note (Signed)
11/10/2011  8:18 AM  PATIENT:  Rebecca [name]  66 y.o. female  PRE-OPERATIVE DIAGNOSIS:  RECTOCELE  POST-OPERATIVE DIAGNOSIS:  RECTOCELE  PROCEDURE:  Procedure(s): POSTERIOR REPAIR (RECTOCELE)  SURGEON:  Surgeon(s): Loney Laurence, MD Mickel Baas  ASSISTANTS: Dr. Arlyce Dice   ANESTHESIA:   general  EBL:  Total I/O In: 1000 [I.V.:1000] Out: 150 [Urine:100; Blood:50]  LOCAL MEDICATIONS USED:  XYLOCAINE 10CC  SPECIMEN:  No Specimen  DISPOSITION OF SPECIMEN:  N/A  COUNTS:  YES  DICTATION: see below  PLAN OF CARE: Discharge to home after PACU  PATIENT DISPOSITION:  PACU - hemodynamically stable.   Delay start of Pharmacological VTE agent (>24hrs) due to surgical blood loss or risk of bleeding:  NOT APPLICABLE:20182  Complications: none  Meds:  0.5% xylocaine with epi  Technique:  After adequate anesthesia was achieved, the patient was prepped and draped in the usual fashion; the bladder was emptied with a red rubber catheter.  The perineum was grasped with allises and the midline was grasped all the way to the top of the rectocele with allises.  Xylocaine was injected submucosally and then incised in the midline vertically.  The mucosa was then retracted off the rectovaginal fascia with sharp and blunt dissection.  A small defect in the rectovaginal fascia over the fat outside the rectum was repaired with 3-0 vicryl; the rectum remained intact throughout the procedure and was checked with a gloved finger several times.  Three mattress stitches were then used to close the rectovaginal fascia over the defect.  The vaginal mucosa was then closed with a running lock stitch of 2-0 vicryl.  A vaginal pack was placed with estrogen cream.  The patient tolerated the procedure well and was returned to the PACU in stable condition.

## 2011-11-11 ENCOUNTER — Encounter (HOSPITAL_COMMUNITY): Payer: Self-pay | Admitting: Obstetrics and Gynecology

## 2012-07-28 ENCOUNTER — Other Ambulatory Visit: Payer: Self-pay | Admitting: Obstetrics and Gynecology

## 2012-07-28 DIAGNOSIS — Z1231 Encounter for screening mammogram for malignant neoplasm of breast: Secondary | ICD-10-CM

## 2012-08-28 ENCOUNTER — Ambulatory Visit
Admission: RE | Admit: 2012-08-28 | Discharge: 2012-08-28 | Disposition: A | Discharge status: Home / Self Care | Source: Ambulatory Visit | Attending: Obstetrics and Gynecology | Admitting: Obstetrics and Gynecology

## 2012-08-28 DIAGNOSIS — Z1231 Encounter for screening mammogram for malignant neoplasm of breast: Secondary | ICD-10-CM

## 2013-08-01 ENCOUNTER — Other Ambulatory Visit: Payer: Self-pay

## 2013-08-01 DIAGNOSIS — Z1231 Encounter for screening mammogram for malignant neoplasm of breast: Secondary | ICD-10-CM

## 2013-08-24 DIAGNOSIS — Z23 Encounter for immunization: Secondary | ICD-10-CM | POA: Diagnosis not present

## 2013-09-05 ENCOUNTER — Ambulatory Visit
Admission: RE | Admit: 2013-09-05 | Discharge: 2013-09-05 | Disposition: A | Discharge status: Home / Self Care | Source: Ambulatory Visit

## 2013-09-05 DIAGNOSIS — G43719 Chronic migraine without aura, intractable, without status migrainosus: Secondary | ICD-10-CM | POA: Diagnosis not present

## 2013-09-05 DIAGNOSIS — Z1231 Encounter for screening mammogram for malignant neoplasm of breast: Secondary | ICD-10-CM

## 2013-09-05 DIAGNOSIS — G43019 Migraine without aura, intractable, without status migrainosus: Secondary | ICD-10-CM | POA: Diagnosis not present

## 2013-10-11 DIAGNOSIS — M542 Cervicalgia: Secondary | ICD-10-CM | POA: Diagnosis not present

## 2013-10-11 DIAGNOSIS — M25519 Pain in unspecified shoulder: Secondary | ICD-10-CM | POA: Diagnosis not present

## 2013-10-12 DIAGNOSIS — M5030 Other cervical disc degeneration, unspecified cervical region: Secondary | ICD-10-CM | POA: Diagnosis not present

## 2013-10-12 DIAGNOSIS — M19019 Primary osteoarthritis, unspecified shoulder: Secondary | ICD-10-CM | POA: Diagnosis not present

## 2013-10-12 DIAGNOSIS — M47812 Spondylosis without myelopathy or radiculopathy, cervical region: Secondary | ICD-10-CM | POA: Diagnosis not present

## 2013-10-12 DIAGNOSIS — M25519 Pain in unspecified shoulder: Secondary | ICD-10-CM | POA: Diagnosis not present

## 2013-10-12 DIAGNOSIS — M899 Disorder of bone, unspecified: Secondary | ICD-10-CM | POA: Diagnosis not present

## 2013-10-19 DIAGNOSIS — Z1289 Encounter for screening for malignant neoplasm of other sites: Secondary | ICD-10-CM | POA: Diagnosis not present

## 2013-11-27 DIAGNOSIS — Z1211 Encounter for screening for malignant neoplasm of colon: Secondary | ICD-10-CM | POA: Diagnosis not present

## 2014-01-07 DIAGNOSIS — R51 Headache: Secondary | ICD-10-CM | POA: Diagnosis not present

## 2014-01-07 DIAGNOSIS — G518 Other disorders of facial nerve: Secondary | ICD-10-CM | POA: Diagnosis not present

## 2014-01-07 DIAGNOSIS — IMO0001 Reserved for inherently not codable concepts without codable children: Secondary | ICD-10-CM | POA: Diagnosis not present

## 2014-01-07 DIAGNOSIS — M542 Cervicalgia: Secondary | ICD-10-CM | POA: Diagnosis not present

## 2014-01-14 DIAGNOSIS — M79609 Pain in unspecified limb: Secondary | ICD-10-CM | POA: Diagnosis not present

## 2014-01-21 DIAGNOSIS — I8310 Varicose veins of unspecified lower extremity with inflammation: Secondary | ICD-10-CM | POA: Diagnosis not present

## 2014-02-12 DIAGNOSIS — R059 Cough, unspecified: Secondary | ICD-10-CM | POA: Diagnosis not present

## 2014-02-12 DIAGNOSIS — R05 Cough: Secondary | ICD-10-CM | POA: Diagnosis not present

## 2014-02-12 DIAGNOSIS — G4700 Insomnia, unspecified: Secondary | ICD-10-CM | POA: Diagnosis not present

## 2014-02-12 DIAGNOSIS — J309 Allergic rhinitis, unspecified: Secondary | ICD-10-CM | POA: Diagnosis not present

## 2014-02-12 DIAGNOSIS — J209 Acute bronchitis, unspecified: Secondary | ICD-10-CM | POA: Diagnosis not present

## 2014-02-12 DIAGNOSIS — J019 Acute sinusitis, unspecified: Secondary | ICD-10-CM | POA: Diagnosis not present

## 2014-02-21 DIAGNOSIS — R002 Palpitations: Secondary | ICD-10-CM | POA: Diagnosis not present

## 2014-02-21 DIAGNOSIS — G4700 Insomnia, unspecified: Secondary | ICD-10-CM | POA: Diagnosis not present

## 2014-02-21 DIAGNOSIS — R51 Headache: Secondary | ICD-10-CM | POA: Diagnosis not present

## 2014-02-28 DIAGNOSIS — R0789 Other chest pain: Secondary | ICD-10-CM | POA: Diagnosis not present

## 2014-02-28 DIAGNOSIS — R002 Palpitations: Secondary | ICD-10-CM | POA: Diagnosis not present

## 2014-03-05 DIAGNOSIS — G43719 Chronic migraine without aura, intractable, without status migrainosus: Secondary | ICD-10-CM | POA: Diagnosis not present

## 2014-03-05 DIAGNOSIS — G43019 Migraine without aura, intractable, without status migrainosus: Secondary | ICD-10-CM | POA: Diagnosis not present

## 2014-03-25 DIAGNOSIS — D72819 Decreased white blood cell count, unspecified: Secondary | ICD-10-CM | POA: Diagnosis not present

## 2014-04-03 DIAGNOSIS — N760 Acute vaginitis: Secondary | ICD-10-CM | POA: Diagnosis not present

## 2014-04-03 DIAGNOSIS — IMO0002 Reserved for concepts with insufficient information to code with codable children: Secondary | ICD-10-CM | POA: Diagnosis not present

## 2014-05-24 DIAGNOSIS — K219 Gastro-esophageal reflux disease without esophagitis: Secondary | ICD-10-CM | POA: Diagnosis not present

## 2014-05-24 DIAGNOSIS — B009 Herpesviral infection, unspecified: Secondary | ICD-10-CM | POA: Diagnosis not present

## 2014-06-06 DIAGNOSIS — G43019 Migraine without aura, intractable, without status migrainosus: Secondary | ICD-10-CM | POA: Diagnosis not present

## 2014-06-27 ENCOUNTER — Encounter (HOSPITAL_COMMUNITY): Payer: Self-pay

## 2014-06-27 ENCOUNTER — Encounter (HOSPITAL_COMMUNITY)
Admission: RE | Admit: 2014-06-27 | Discharge: 2014-06-27 | Disposition: A | Discharge status: Home / Self Care | Payer: Medicare Other | Source: Ambulatory Visit | Attending: Obstetrics and Gynecology | Admitting: Obstetrics and Gynecology

## 2014-06-27 DIAGNOSIS — N895 Stricture and atresia of vagina: Secondary | ICD-10-CM | POA: Diagnosis not present

## 2014-06-27 DIAGNOSIS — Z01818 Encounter for other preprocedural examination: Secondary | ICD-10-CM | POA: Insufficient documentation

## 2014-06-27 LAB — CBC
HEMATOCRIT: 40.8 % (ref 36.0–46.0)
HEMOGLOBIN: 13.6 g/dL (ref 12.0–15.0)
MCH: 31.5 pg (ref 26.0–34.0)
MCHC: 33.3 g/dL (ref 30.0–36.0)
MCV: 94.4 fL (ref 78.0–100.0)
Platelets: 172 10*3/uL (ref 150–400)
RBC: 4.32 MIL/uL (ref 3.87–5.11)
RDW: 12.3 % (ref 11.5–15.5)
WBC: 4.1 10*3/uL (ref 4.0–10.5)

## 2014-06-27 NOTE — Pre-Procedure Instructions (Signed)
EKG reviewed and accepted by Dr Royce Macadamia. No orders given.

## 2014-06-27 NOTE — Patient Instructions (Signed)
20 Rebecca [name]  06/27/2014   Your procedure is scheduled on:  07/03/14  Enter through the Main Entrance of [facility] [place] up the phone at the desk and dial [phone].   Call this number if you have problems the morning of surgery: [phone]   Remember:   Do not eat food:After Midnight.  Do not drink clear liquids: After Midnight.  Take these medicines the morning of surgery with A SIP OF WATER: omeprazole   Do not wear jewelry, make-up or nail polish.  Do not wear lotions, powders, or perfumes. You may wear deodorant.  Do not shave 48 hours prior to surgery.  Do not bring valuables to the hospital.  [facility] is not   responsible for any belongings or valuables brought to the hospital.  Contacts, dentures or bridgework may not be worn into surgery.  Leave suitcase in the car. After surgery it may be brought to your room.  For patients admitted to the hospital, checkout time is 11:00 AM the day of              discharge.   Patients discharged the day of surgery will not be allowed to drive             home.  Name and phone number of your driver: husband  Special Instructions:      Please read over the following fact sheets that you were given:   Surgical Site Infection Prevention

## 2014-07-02 ENCOUNTER — Other Ambulatory Visit: Payer: Self-pay | Admitting: Obstetrics and Gynecology

## 2014-07-02 NOTE — H&P (Signed)
68 y.o. yo complains of vaginal narrowing from band from posterior repair.  Pt taken to OR 11-2011 for symptomatic recotcele that worsened after TVT.  Pt strongly desired surgery for sx.  She was counseled at that time of the risk of narrowing of vagina after surgery.  Since surgery there is an area narrowed by [name] vaginal band created by the recotocele repair about 5 cm from the hymen.  This has made sex painful to the point of not being able to. She c/o burning in vagina. Despite premarin cream and treatment for BV, the area is still too tight even to insert [name] small douche applicator; she has pain with insertion even with biofeedback exercises.     Past Medical History  Diagnosis Date  . Blood transfusion without reported diagnosis 1984 x7    [facility]  . Blood transfusion     Blood bank notified Ashley Akin)  . GERD (gastroesophageal reflux disease)   . Headache(784.0)     migraines  . Complication of anesthesia 2006    Drop in  Bp intra-op with Lap Chole. [employer].   Past Surgical History  Procedure Laterality Date  . Ovarian cyst removal      bilateral  . Cholecystectomy    . Rotator cuff repair    . Hernia repair    . Eye muscle surgery  as child  . Appendectomy  1967  . Breast surgery      implants due to removal of breast tissue  . Abdominal hysterectomy  1984  . Knee arthroplasty  2006  . Bladder suspension  06/23/2011    Procedure: TRANSVAGINAL TAPE (TVT) PROCEDURE;  Surgeon: Daria Pastures;  Location: [facility];  Service: Gynecology;  Laterality: N/[name];  . Cystocele repair  06/23/2011    Procedure: ANTERIOR REPAIR (CYSTOCELE);  Surgeon: Daria Pastures;  Location: [facility];  Service: Gynecology;  Laterality: N/[name];  . Cystoscopy  06/23/2011    Procedure: CYSTOSCOPY;  Surgeon: Daria Pastures;  Location: [facility];  Service: Gynecology;  Laterality: N/[name];  . Rectocele repair  11/10/2011    Procedure: POSTERIOR REPAIR (RECTOCELE);  Surgeon: Daria Pastures, MD;  Location: [facility];  Service: Gynecology;  Laterality: N/[name];    History   Social History  . Marital Status: Married    Spouse Name: N/[name]    Number of Children: N/[name]  . Years of Education: N/[name]   Occupational History  . Not on file.   Social History Main Topics  . Smoking status: Former Research scientist (life sciences)  . Smokeless tobacco: Never Used  . Alcohol Use: No  . Drug Use: No  . Sexual Activity: Not on file   Other Topics Concern  . Not on file   Social History Narrative  . No narrative on file    No current facility-administered medications on file prior to encounter.   Current Outpatient Prescriptions on File Prior to Encounter  Medication Sig Dispense Refill  . b complex vitamins tablet Take 1 tablet by mouth daily.        . Cholecalciferol (VITAMIN D) 2000 UNITS tablet Take 2,000 Units by mouth daily.          Allergies  Allergen Reactions  . Codeine Nausea Only    @VITALS2 @  Lungs: clear to ascultation Cor:  RRR Abdomen:  soft, nontender, nondistended. Ex:  no cords, erythema Pelvic:  Female Genitalia: Vulva: no masses, atrophy, or lesions. Bladder/Urethra: no urethral discharge or mass and normal meatus and bladder non distended. Vagina  no erythema, cystocele, rectocele, or vesicle(s) or ulcers and tenderness and abnormal vaginal discharge; still [name] tight band that is very tender in anterior from 9:00 to 12:00; depth past that [name] little narrow but band is what is very tender.. Adnexa/Parametria: no parametrial tenderness or mass and no adnexal tenderness or ovarian mass.  BV was successfully treated.  [name]:  Tight transverse vaginal band from rectocele repair that was unable to be reduced with conservative measures.  Pt strongly desires surgical correction to make exams and sex more comfortable   P: All risks, benefits and alternatives d/w patient and she desires to proceed with [name] vaginal band incision or possible revision of posterior repair .  Patient will have SCDs during the operation.      Rebecca [name]

## 2014-07-03 ENCOUNTER — Encounter (HOSPITAL_COMMUNITY): Payer: Medicare Other | Admitting: Anesthesiology

## 2014-07-03 ENCOUNTER — Ambulatory Visit (HOSPITAL_COMMUNITY): Payer: Medicare Other | Admitting: Anesthesiology

## 2014-07-03 ENCOUNTER — Encounter (HOSPITAL_COMMUNITY)
Admission: RE | Disposition: A | Discharge status: Home / Self Care | Payer: Self-pay | Source: Ambulatory Visit | Attending: Obstetrics and Gynecology

## 2014-07-03 ENCOUNTER — Ambulatory Visit (HOSPITAL_COMMUNITY)
Admission: RE | Admit: 2014-07-03 | Discharge: 2014-07-04 | Disposition: A | Discharge status: Home / Self Care | Payer: Medicare Other | Source: Ambulatory Visit | Attending: Obstetrics and Gynecology | Admitting: Obstetrics and Gynecology

## 2014-07-03 ENCOUNTER — Encounter (HOSPITAL_COMMUNITY): Payer: Self-pay | Admitting: *Deleted

## 2014-07-03 DIAGNOSIS — Z9889 Other specified postprocedural states: Secondary | ICD-10-CM

## 2014-07-03 DIAGNOSIS — N895 Stricture and atresia of vagina: Secondary | ICD-10-CM | POA: Diagnosis not present

## 2014-07-03 DIAGNOSIS — Z87891 Personal history of nicotine dependence: Secondary | ICD-10-CM | POA: Insufficient documentation

## 2014-07-03 DIAGNOSIS — IMO0002 Reserved for concepts with insufficient information to code with codable children: Secondary | ICD-10-CM | POA: Diagnosis not present

## 2014-07-03 DIAGNOSIS — K219 Gastro-esophageal reflux disease without esophagitis: Secondary | ICD-10-CM | POA: Diagnosis not present

## 2014-07-03 DIAGNOSIS — Z885 Allergy status to narcotic agent status: Secondary | ICD-10-CM | POA: Insufficient documentation

## 2014-07-03 HISTORY — PX: RECTOCELE REPAIR: SHX761

## 2014-07-03 LAB — CBC
HEMATOCRIT: 32.6 % — AB (ref 36.0–46.0)
Hemoglobin: 10.9 g/dL — ABNORMAL LOW (ref 12.0–15.0)
MCH: 31.1 pg (ref 26.0–34.0)
MCHC: 33.4 g/dL (ref 30.0–36.0)
MCV: 93.1 fL (ref 78.0–100.0)
Platelets: 138 10*3/uL — ABNORMAL LOW (ref 150–400)
RBC: 3.5 MIL/uL — ABNORMAL LOW (ref 3.87–5.11)
RDW: 12 % (ref 11.5–15.5)
WBC: 4.1 10*3/uL (ref 4.0–10.5)

## 2014-07-03 LAB — PREPARE RBC (CROSSMATCH)

## 2014-07-03 LAB — ABO/RH: ABO/RH(D): A NEG

## 2014-07-03 SURGERY — COLPORRHAPHY, POSTERIOR, FOR RECTOCELE REPAIR
Anesthesia: General | Site: Vagina

## 2014-07-03 MED ORDER — PHENYLEPHRINE HCL 10 MG/ML IJ SOLN
INTRAMUSCULAR | Status: DC | PRN
  Administered 2014-07-03 (×5): 40 ug via INTRAVENOUS

## 2014-07-03 MED ORDER — SCOPOLAMINE 1 MG/3DAYS TD PT72
MEDICATED_PATCH | TRANSDERMAL | Status: AC
  Administered 2014-07-03: 1.5 mg via TRANSDERMAL
  Filled 2014-07-03: qty 1

## 2014-07-03 MED ORDER — DEXAMETHASONE SODIUM PHOSPHATE 10 MG/ML IJ SOLN
INTRAMUSCULAR | Status: AC
  Filled 2014-07-03: qty 1

## 2014-07-03 MED ORDER — ACETAMINOPHEN 160 MG/5ML PO SOLN: 975.0000 mg | Freq: Once | ORAL | Status: DC | PRN

## 2014-07-03 MED ORDER — FENTANYL CITRATE 0.05 MG/ML IJ SOLN
INTRAMUSCULAR | Status: AC
  Filled 2014-07-03: qty 2

## 2014-07-03 MED ORDER — MIDAZOLAM HCL 2 MG/2ML IJ SOLN
INTRAMUSCULAR | Status: AC
  Filled 2014-07-03: qty 2

## 2014-07-03 MED ORDER — DEXAMETHASONE SODIUM PHOSPHATE 10 MG/ML IJ SOLN
INTRAMUSCULAR | Status: DC | PRN
  Administered 2014-07-03: 5 mg via INTRAVENOUS

## 2014-07-03 MED ORDER — ADULT MULTIVITAMIN W/MINERALS CH
1.0000 | ORAL_TABLET | Freq: Every day | ORAL | Status: DC
  Administered 2014-07-04: 1 via ORAL
  Filled 2014-07-03 (×2): qty 1

## 2014-07-03 MED ORDER — MEPERIDINE HCL 25 MG/ML IJ SOLN
INTRAMUSCULAR | Status: AC
  Filled 2014-07-03: qty 1

## 2014-07-03 MED ORDER — TRAZODONE HCL 50 MG PO TABS
50.0000 mg | ORAL_TABLET | Freq: Every day | ORAL | Status: DC
  Administered 2014-07-03: 50 mg via ORAL
  Filled 2014-07-03: qty 1

## 2014-07-03 MED ORDER — GELATIN ABSORBABLE 12-7 MM EX MISC
CUTANEOUS | Status: AC
  Filled 2014-07-03: qty 1

## 2014-07-03 MED ORDER — LIDOCAINE-EPINEPHRINE (PF) 1 %-1:200000 IJ SOLN
INTRAMUSCULAR | Status: DC | PRN
  Administered 2014-07-03: 5 mL

## 2014-07-03 MED ORDER — CYCLOBENZAPRINE HCL 10 MG PO TABS
10.0000 mg | ORAL_TABLET | Freq: Three times a day (TID) | ORAL | Status: DC | PRN
  Filled 2014-07-03: qty 1

## 2014-07-03 MED ORDER — ESTRADIOL 0.1 MG/GM VA CREA
TOPICAL_CREAM | VAGINAL | Status: AC
  Filled 2014-07-03: qty 42.5

## 2014-07-03 MED ORDER — PROPOFOL 10 MG/ML IV BOLUS
INTRAVENOUS | Status: DC | PRN
  Administered 2014-07-03: 140 mg via INTRAVENOUS
  Administered 2014-07-03: 30 mg via INTRAVENOUS
  Administered 2014-07-03: 20 mg via INTRAVENOUS

## 2014-07-03 MED ORDER — SCOPOLAMINE 1 MG/3DAYS TD PT72
1.0000 | MEDICATED_PATCH | Freq: Once | TRANSDERMAL | Status: DC
  Administered 2014-07-03: 1.5 mg via TRANSDERMAL

## 2014-07-03 MED ORDER — OXYCODONE-ACETAMINOPHEN 5-325 MG PO TABS
1.0000 | ORAL_TABLET | ORAL | Status: DC | PRN
  Administered 2014-07-03 (×2): 1 via ORAL
  Filled 2014-07-03 (×2): qty 1

## 2014-07-03 MED ORDER — ZONISAMIDE 50 MG PO CAPS
50.0000 mg | ORAL_CAPSULE | Freq: Every day | ORAL | Status: DC
  Administered 2014-07-03: 50 mg via ORAL

## 2014-07-03 MED ORDER — CEFAZOLIN SODIUM-DEXTROSE 2-3 GM-% IV SOLR
INTRAVENOUS | Status: DC | PRN
  Administered 2014-07-03: 2 g via INTRAVENOUS

## 2014-07-03 MED ORDER — PROPOFOL 10 MG/ML IV BOLUS
INTRAVENOUS | Status: DC | PRN
  Administered 2014-07-03: 150 mg via INTRAVENOUS

## 2014-07-03 MED ORDER — FENTANYL CITRATE 0.05 MG/ML IJ SOLN
INTRAMUSCULAR | Status: AC
  Administered 2014-07-03: 50 ug via INTRAVENOUS
  Filled 2014-07-03: qty 2

## 2014-07-03 MED ORDER — PANTOPRAZOLE SODIUM 40 MG PO TBEC
40.0000 mg | DELAYED_RELEASE_TABLET | Freq: Every day | ORAL | Status: DC
  Administered 2014-07-04: 40 mg via ORAL
  Filled 2014-07-03: qty 1

## 2014-07-03 MED ORDER — PROMETHAZINE HCL 25 MG/ML IJ SOLN: 6.2500 mg | INTRAMUSCULAR | Status: DC | PRN

## 2014-07-03 MED ORDER — ONDANSETRON HCL 4 MG/2ML IJ SOLN
INTRAMUSCULAR | Status: AC
  Filled 2014-07-03: qty 2

## 2014-07-03 MED ORDER — LIDOCAINE-EPINEPHRINE (PF) 1 %-1:200000 IJ SOLN
INTRAMUSCULAR | Status: AC
  Filled 2014-07-03: qty 10

## 2014-07-03 MED ORDER — OXYCODONE-ACETAMINOPHEN 5-325 MG PO TABS: 1.0000 | ORAL_TABLET | ORAL | Status: DC | PRN

## 2014-07-03 MED ORDER — PNEUMOCOCCAL VAC POLYVALENT 25 MCG/0.5ML IJ INJ
0.5000 mL | INJECTION | INTRAMUSCULAR | Status: DC
  Filled 2014-07-03: qty 0.5

## 2014-07-03 MED ORDER — MIDAZOLAM HCL 2 MG/2ML IJ SOLN
INTRAMUSCULAR | Status: DC | PRN
  Administered 2014-07-03: 2 mg via INTRAVENOUS

## 2014-07-03 MED ORDER — ONDANSETRON HCL 4 MG/2ML IJ SOLN
INTRAMUSCULAR | Status: DC | PRN
  Administered 2014-07-03: 4 mg via INTRAVENOUS

## 2014-07-03 MED ORDER — KETOROLAC TROMETHAMINE 30 MG/ML IJ SOLN
INTRAMUSCULAR | Status: DC | PRN
  Administered 2014-07-03: 30 mg via INTRAVENOUS

## 2014-07-03 MED ORDER — LACTATED RINGERS IV SOLN
INTRAVENOUS | Status: DC
Start: 2014-07-03 — End: 2014-07-03
  Administered 2014-07-03 (×6): via INTRAVENOUS

## 2014-07-03 MED ORDER — KETOROLAC TROMETHAMINE 30 MG/ML IJ SOLN: 30.0000 mg | Freq: Four times a day (QID) | INTRAMUSCULAR | Status: DC

## 2014-07-03 MED ORDER — FENTANYL CITRATE 0.05 MG/ML IJ SOLN
25.0000 ug | INTRAMUSCULAR | Status: DC | PRN
  Administered 2014-07-03 (×2): 50 ug via INTRAVENOUS
  Administered 2014-07-03: 25 ug via INTRAVENOUS
  Administered 2014-07-03 (×2): 50 ug via INTRAVENOUS

## 2014-07-03 MED ORDER — PROPOFOL 10 MG/ML IV EMUL
INTRAVENOUS | Status: AC
  Filled 2014-07-03: qty 20

## 2014-07-03 MED ORDER — LIDOCAINE HCL (CARDIAC) 20 MG/ML IV SOLN
INTRAVENOUS | Status: DC | PRN
  Administered 2014-07-03: 60 mg via INTRAVENOUS

## 2014-07-03 MED ORDER — LIDOCAINE HCL (CARDIAC) 20 MG/ML IV SOLN
INTRAVENOUS | Status: AC
  Filled 2014-07-03: qty 5

## 2014-07-03 MED ORDER — ESTRADIOL 0.1 MG/GM VA CREA
TOPICAL_CREAM | VAGINAL | Status: DC | PRN
  Administered 2014-07-03: 1 via VAGINAL

## 2014-07-03 MED ORDER — LIDOCAINE HCL (CARDIAC) 20 MG/ML IV SOLN
INTRAVENOUS | Status: DC | PRN
  Administered 2014-07-03: 80 mg via INTRAVENOUS

## 2014-07-03 MED ORDER — ONDANSETRON HCL 4 MG/2ML IJ SOLN: 4.0000 mg | Freq: Four times a day (QID) | INTRAMUSCULAR | Status: DC | PRN

## 2014-07-03 MED ORDER — FENTANYL CITRATE 0.05 MG/ML IJ SOLN
INTRAMUSCULAR | Status: DC | PRN
  Administered 2014-07-03 (×2): 50 ug via INTRAVENOUS

## 2014-07-03 MED ORDER — MENTHOL 3 MG MT LOZG: 1.0000 | LOZENGE | OROMUCOSAL | Status: DC | PRN

## 2014-07-03 MED ORDER — MEPERIDINE HCL 25 MG/ML IJ SOLN
6.2500 mg | INTRAMUSCULAR | Status: DC | PRN
  Administered 2014-07-03: 6.25 mg via INTRAVENOUS

## 2014-07-03 MED ORDER — ONDANSETRON HCL 4 MG PO TABS: 4.0000 mg | ORAL_TABLET | Freq: Four times a day (QID) | ORAL | Status: DC | PRN

## 2014-07-03 MED ORDER — SODIUM CHLORIDE 0.9 % IV SOLN
INTRAVENOUS | Status: DC | PRN
  Administered 2014-07-03: 12:00:00 via INTRAVENOUS

## 2014-07-03 MED ORDER — KETOROLAC TROMETHAMINE 30 MG/ML IJ SOLN
INTRAMUSCULAR | Status: AC
  Filled 2014-07-03: qty 1

## 2014-07-03 MED ORDER — DOCUSATE SODIUM 100 MG PO CAPS
200.0000 mg | ORAL_CAPSULE | Freq: Three times a day (TID) | ORAL | Status: DC
  Administered 2014-07-03 – 2014-07-04 (×2): 200 mg via ORAL
  Filled 2014-07-03 (×3): qty 2

## 2014-07-03 MED ORDER — ZONISAMIDE 50 MG PO CAPS
100.0000 mg | ORAL_CAPSULE | Freq: Every day | ORAL | Status: DC
  Administered 2014-07-04: 100 mg via ORAL

## 2014-07-03 MED ORDER — IBUPROFEN 800 MG PO TABS
800.0000 mg | ORAL_TABLET | Freq: Three times a day (TID) | ORAL | Status: DC | PRN
  Administered 2014-07-03: 800 mg via ORAL
  Filled 2014-07-03: qty 1

## 2014-07-03 SURGICAL SUPPLY — 34 items
BLADE 15 SAFETY STRL DISP (BLADE) ×4 IMPLANT
CANNULA CURETTE W/SYR 6 (CANNULA) ×2 IMPLANT
CANNULA CURETTE W/SYR 6MM (CANNULA) ×1
CLOTH BEACON ORANGE TIMEOUT ST (SAFETY) ×4 IMPLANT
COUNTER NEEDLE 1200 MAGNETIC (NEEDLE) ×3 IMPLANT
COVER LIGHT HANDLE  1/PK (MISCELLANEOUS) ×2
COVER LIGHT HANDLE 1/PK (MISCELLANEOUS) ×1 IMPLANT
ELECT REM PT RETURN 9FT ADLT (ELECTROSURGICAL) ×4
ELECTRODE REM PT RTRN 9FT ADLT (ELECTROSURGICAL) ×2 IMPLANT
GAUZE PACKING 1 X5 YD ST (GAUZE/BANDAGES/DRESSINGS) ×3 IMPLANT
GLOVE BIO SURGEON STRL SZ 6.5 (GLOVE) ×3 IMPLANT
GLOVE BIO SURGEON STRL SZ7 (GLOVE) ×33 IMPLANT
GLOVE BIO SURGEONS STRL SZ 6.5 (GLOVE) ×1
GLOVE SURG SS PI 7.0 STRL IVOR (GLOVE) ×27 IMPLANT
GOWN STRL REUS W/TWL LRG LVL3 (GOWN DISPOSABLE) ×11 IMPLANT
NDL SPNL 22GX3.5 QUINCKE BK (NEEDLE) ×1 IMPLANT
NEEDLE SPNL 22GX3.5 QUINCKE BK (NEEDLE) ×4 IMPLANT
NS IRRIG 1000ML POUR BTL (IV SOLUTION) ×4 IMPLANT
PACK VAGINAL MINOR WOMEN LF (CUSTOM PROCEDURE TRAY) ×4 IMPLANT
PAD OB MATERNITY 4.3X12.25 (PERSONAL CARE ITEMS) ×4 IMPLANT
PAD PREP 24X48 CUFFED NSTRL (MISCELLANEOUS) ×4 IMPLANT
PENCIL BUTTON HOLSTER BLD 10FT (ELECTRODE) ×4 IMPLANT
SUT VIC AB 2-0 CT1 27 (SUTURE) ×8
SUT VIC AB 2-0 CT1 TAPERPNT 27 (SUTURE) ×2 IMPLANT
SUT VIC AB 2-0 SH 27 (SUTURE) ×8
SUT VIC AB 2-0 SH 27XBRD (SUTURE) ×2 IMPLANT
SYR TOOMEY 50ML (SYRINGE) ×3 IMPLANT
SYRINGE CONTROL L 12CC (SYRINGE) ×4 IMPLANT
SYRINGE CONTROL LL 12CC (SYRINGE) ×1 IMPLANT
TOWEL OR 17X24 6PK STRL BLUE (TOWEL DISPOSABLE) ×8 IMPLANT
TRAY FOLEY CATH 14FR (SET/KITS/TRAYS/PACK) ×3 IMPLANT
TUBING SUCTION BULK 100 FT (MISCELLANEOUS) ×3 IMPLANT
WATER STERILE IRR 1000ML POUR (IV SOLUTION) ×4 IMPLANT
YANKAUER SUCT BULB TIP NO VENT (SUCTIONS) ×3 IMPLANT

## 2014-07-03 SURGICAL SUPPLY — 29 items
AGENT HMST MTR 8 SURGIFLO (HEMOSTASIS) ×1
CANISTER SUCT 3000ML (MISCELLANEOUS) ×3 IMPLANT
CLOTH BEACON ORANGE TIMEOUT ST (SAFETY) ×3 IMPLANT
DECANTER SPIKE VIAL GLASS SM (MISCELLANEOUS) ×2 IMPLANT
GAUZE PACKING 2X5 YD STRL (GAUZE/BANDAGES/DRESSINGS) ×4 IMPLANT
GLOVE BIO SURGEON STRL SZ7 (GLOVE) ×17 IMPLANT
GLOVE BIOGEL PI IND STRL 7.0 (GLOVE) IMPLANT
GLOVE BIOGEL PI INDICATOR 7.0 (GLOVE) ×10
GLOVE SURG SS PI 7.0 STRL IVOR (GLOVE) ×12 IMPLANT
GOWN STRL REUS W/TWL LRG LVL3 (GOWN DISPOSABLE) ×12 IMPLANT
NDL SPNL 22GX3.5 QUINCKE BK (NEEDLE) IMPLANT
NEEDLE HYPO 22GX1.5 SAFETY (NEEDLE) ×3 IMPLANT
NEEDLE SPNL 22GX3.5 QUINCKE BK (NEEDLE) IMPLANT
NS IRRIG 1000ML POUR BTL (IV SOLUTION) ×3 IMPLANT
PACK VAGINAL WOMENS (CUSTOM PROCEDURE TRAY) ×5 IMPLANT
SPOGE SURGIFLO 8M (HEMOSTASIS) ×2
SPONGE SURGIFLO 8M (HEMOSTASIS) IMPLANT
SUT VIC AB 0 CT1 18XCR BRD8 (SUTURE) ×1 IMPLANT
SUT VIC AB 0 CT1 8-18 (SUTURE) ×3
SUT VIC AB 2-0 CT1 (SUTURE) ×8 IMPLANT
SUT VIC AB 2-0 CT1 27 (SUTURE) ×12
SUT VIC AB 2-0 CT1 TAPERPNT 27 (SUTURE) ×4 IMPLANT
SUT VIC AB 2-0 SH 27 (SUTURE) ×9
SUT VIC AB 2-0 SH 27XBRD (SUTURE) IMPLANT
TOWEL OR 17X24 6PK STRL BLUE (TOWEL DISPOSABLE) ×12 IMPLANT
TRAY FOLEY CATH 14FR (SET/KITS/TRAYS/PACK) ×3 IMPLANT
TUBING SUCTION BULK 100 FT (MISCELLANEOUS) ×2 IMPLANT
WATER STERILE IRR 1000ML POUR (IV SOLUTION) ×3 IMPLANT
YANKAUER SUCT BULB TIP NO VENT (SUCTIONS) ×2 IMPLANT

## 2014-07-03 NOTE — Brief Op Note (Signed)
07/03/2014  11:15 AM  PATIENT:  Rebecca [name]  68 y.o. female  PRE-OPERATIVE DIAGNOSIS:  STRICTURE OF VAGINA  POST-OPERATIVE DIAGNOSIS:  STRICTURE OF VAGINA  PROCEDURE:  Procedure(s): revision of POSTERIOR REPAIR (RECTOCELE) with bladder instillation  (N/A)  SURGEON:  Surgeon(s) and Role:    * Daria Pastures, MD - Primary   ANESTHESIA:   general  EBL:  Total I/O In: 1200 [I.V.:1200] Out: 50 [Urine:50]  LOCAL MEDICATIONS USED:  MARCAINE     SPECIMEN:  No Specimen  DISPOSITION OF SPECIMEN:  PATHOLOGY  COUNTS:  YES  TOURNIQUET:  * No tourniquets in log *  DICTATION: .Note written in EPIC  PLAN OF CARE: Discharge to home after PACU  PATIENT DISPOSITION:  PACU - hemodynamically stable.   Delay start of Pharmacological VTE agent (>24hrs) due to surgical blood loss or risk of bleeding: not applicable  Complications:  Meds:  1% lidocaine with epi, enfamil in bladder, estrace cream.  Technique:  After adequate anesthesia was achieved, the patient was prepped and draped in the usual fashion; the bladder was emptied with a red rubber catheter.  The perineum was grasped with allises and the midline was grasped all the way to the top of the rectocele with allises.  Lidocaine was injected submucosally and then incised in the midline vertically.  The mucosa over the stricture was then retracted off the rectovaginal fascia with sharp and blunt dissection.  The rectum remained intact throughout the procedure and was checked with a gloved finger several times.  The peritoneum was entered into inadvertently at the top of the cuff without injury- this was closed with a figure of eight stitch of 2-0 vicryl.  Bladder was instilled with enfamil and was found to be intact. The rectovaginal fascia was still over most of the defect but some areas over the rectum were only covered rectovaginal fat .  The vaginal mucosa was then closed from superior to inferior to reduce the stricture with a  running lock stitch of 2-0 vicryl.  Two fingers were able to be inserted into vagina without stricture but at the expense of some vaginal length.  Since the vaginal mucosa was very stuck to the underlying rectovaginal fascia to the peritoneal cavity, it was difficult to mobilize any additional room side to side. I explained the repair to the husband.  A vaginal pack was placed with estrogen cream.  The patient tolerated the procedure well and was returned to the PACU in stable condition.  The pt will return in 2 days in office for removal of vaginal pack.  Kare Dado A

## 2014-07-03 NOTE — Op Note (Signed)
07/03/2014  1:04 PM  PATIENT:  Rebecca [name]  68 y.o. female  PRE-OPERATIVE DIAGNOSIS:  Post op Bleeding  POST-OPERATIVE DIAGNOSIS:  Post op Bleeding  PROCEDURE:  Procedure(s): POSTERIOR REPAIR (RECTOCELE) with suture repair for hemostasis  (N/A)  SURGEON:  Surgeon(s) and Role:    * Daria Pastures, MD - Primary    * Olga Millers, MD - Assisting  PHYSICIAN ASSISTANT:   ASSISTANTS: Freda Munro   ANESTHESIA:   general  EBL:  Total I/O In: 2900 [I.V.:2900] Out: 670 [Urine:50; Blood:620]  BLOOD ADMINISTERED:none  DRAINS: none   LOCAL MEDICATIONS USED:  Estrace cream  SPECIMEN:  No Specimen  DISPOSITION OF SPECIMEN:  N/A  COUNTS:  YES  TOURNIQUET:  * No tourniquets in log *  DICTATION: .Note written in EPIC  PLAN OF CARE: Admit for overnight observation  PATIENT DISPOSITION:  PACU - hemodynamically stable.   Delay start of Pharmacological VTE agent (>24hrs) due to surgical blood loss or risk of bleeding: not applicable  Reason for operation:  Called to see pt in PACU- after being in PACU for 10 minutes, Nurse noted excessive amount of bleeding from vagina and the vag pack fell out.  Bleeding was relatively brisk and she lost an estimated 500 cc in PACU.  Pressure of the vagina in the area that appeared to be bleeding did not stem flow.  Pt taken back to OR for eval and control of bleeding.  Technique  After adequate anethesthia was achieved, the pt was reprepped and redraped.  A foley was placed in bladder.  There appeared to be two areas that had been bleeding but now they had stopped.  A figure of eight stitch was placed in each area and surgiflo placed.  After 10 minutes of watching, including putting pt's legs down, no further bleeding was observed and a vag pack was placed.  As the pt was being awoken and moved to the stretcher, brisk red bleeding was again seen and pt was placed back on the OR table and adequate anesthesia, prep and drape was done  again.    Dr. Ouida Sills came into OR to help.  With good retraction, a pumper was able to be seen on the left side of the vaginal stricture incision.  This area has a larger hole now than previously and blood was welling up from the 1.5 cm defect.  It is likely that a small vessel under the vaginal mucosa was incised and had not been closed with the prior repairs or the Surgiflo.  A running lock stitch deep enough to incorporate the bleeding vessel was performed. An additional figure of eight stitch was performed to ensure hemostasis.  These stitches were in the lower third of the vagina and believed to be below where the peritoneum had been entered. Again we watched for several minutes to see if any bleeding continued and it did not.  A small pack with estrace was placed and the pt was observed for bleeding at multiple points as she was moved and woken up.    She was transferred to the recovery room in stable condition and I will recheck her in PACU.  Complication was d/w husband.  She will be kept for O/N obs. Foley will remain in O/N.

## 2014-07-03 NOTE — Anesthesia Preprocedure Evaluation (Signed)
Anesthesia Evaluation  Patient identified by MRN, date of birth, ID band Patient awake    Reviewed: Allergy & Precautions, H&P , NPO status , Patient's Chart, lab work & pertinent test results, reviewed documented beta blocker date and time   History of Anesthesia Complications (+) history of anesthetic complications (h/o low BP after lap chole in [place])  Airway Mallampati: I TM Distance: >3 FB Neck ROM: full    Dental  (+) Edentulous Upper, Edentulous Lower, Upper Dentures, Lower Dentures   Pulmonary former smoker (quit 1980),  breath sounds clear to auscultation  Pulmonary exam normal       Cardiovascular Exercise Tolerance: Good negative cardio ROS  Rhythm:regular Rate:Normal     Neuro/Psych  Headaches, negative psych ROS   GI/Hepatic Neg liver ROS, GERD-  Medicated,  Endo/Other  negative endocrine ROS  Renal/GU negative Renal ROS  Female GU complaint     Musculoskeletal   Abdominal   Peds  Hematology H/o blood transfusions 1984 - for menorrhagia   Anesthesia Other Findings   Reproductive/Obstetrics negative OB ROS                           Anesthesia Physical Anesthesia Plan  ASA: II  Anesthesia Plan: General LMA   Post-op Pain Management:    Induction:   Airway Management Planned:   Additional Equipment:   Intra-op Plan:   Post-operative Plan:   Informed Consent: I have reviewed the patients History and Physical, chart, labs and discussed the procedure including the risks, benefits and alternatives for the proposed anesthesia with the patient or authorized representative who has indicated his/her understanding and acceptance.   Dental Advisory Given  Plan Discussed with: CRNA and Surgeon  Anesthesia Plan Comments:         Anesthesia Quick Evaluation

## 2014-07-03 NOTE — Brief Op Note (Signed)
07/03/2014  1:04 PM  PATIENT:  Rebecca [name]  68 y.o. female  PRE-OPERATIVE DIAGNOSIS:  Post op Bleeding  POST-OPERATIVE DIAGNOSIS:  Post op Bleeding  PROCEDURE:  Procedure(s): POSTERIOR REPAIR (RECTOCELE) with suture repair for hemostasis  (N/A)  SURGEON:  Surgeon(s) and Role:    * Daria Pastures, MD - Primary    * Olga Millers, MD - Assisting  PHYSICIAN ASSISTANT:   ASSISTANTS: Freda Munro   ANESTHESIA:   general  EBL:  Total I/O In: 2900 [I.V.:2900] Out: 670 [Urine:50; Blood:620]  BLOOD ADMINISTERED:none  DRAINS: none   LOCAL MEDICATIONS USED:  Estrace cream  SPECIMEN:  No Specimen  DISPOSITION OF SPECIMEN:  N/A  COUNTS:  YES  TOURNIQUET:  * No tourniquets in log *  DICTATION: .Note written in EPIC  PLAN OF CARE: Admit for overnight observation  PATIENT DISPOSITION:  PACU - hemodynamically stable.   Delay start of Pharmacological VTE agent (>24hrs) due to surgical blood loss or risk of bleeding: not applicable  Reason for operation:  Called to see pt in PACU- after being in PACU for 10 minutes, Nurse noted excessive amount of bleeding from vagina and the vag pack fell out.  Bleeding was relatively brisk and she lost an estimated 500 cc in PACU.  Pressure of the vagina in the area that appeared to be bleeding did not stem flow.  Pt taken back to OR for eval and control of bleeding.  Technique  After adequate anethesthia was achieved, the pt was reprepped and redraped.  A foley was placed in bladder.  There appeared to be two areas that had been bleeding but now they had stopped.  A figure of eight stitch was placed in each area and surgiflo placed.  After 10 minutes of watching, including putting pt's legs down, no further bleeding was observed and a vag pack was placed.  As the pt was being awoken and moved to the stretcher, brisk red bleeding was again seen and pt was placed back on the OR table and adequate anesthesia, prep and drape was done  again.    Dr. Ouida Sills came into OR to help.  With good retraction, a pumper was able to be seen on the left side of the vaginal stricture incision.  This area has a larger hole now than previously and blood was welling up from the 1.5 cm defect.  It is likely that a small vessel under the vaginal mucosa was incised and had not been closed with the prior repairs or the Surgiflo.  A running lock stitch deep enough to incorporate the bleeding vessel was performed. An additional figure of eight stitch was performed to ensure hemostasis.  These stitches were in the lower third of the vagina and believed to be below where the peritoneum had been entered. Again we watched for several minutes to see if any bleeding continued and it did not.  A small pack with estrace was placed and the pt was observed for bleeding at multiple points as she was moved and woken up.    She was transferred to the recovery room in stable condition and I will recheck her in PACU.  Complication was d/w husband.  She will be kept for O/N obs. Foley will remain in O/N.

## 2014-07-03 NOTE — Progress Notes (Signed)
There has been no change in the patients history, status or exam since the history and physical.  Filed Vitals:   07/03/14 0858 07/03/14 0859  BP:  133/60  Pulse: 78   Temp: 98.1 F (36.7 C)   TempSrc: Oral   Resp: 20   SpO2: 100%     Lab Results  Component Value Date   WBC 4.1 06/27/2014   HGB 13.6 06/27/2014   HCT 40.8 06/27/2014   MCV 94.4 06/27/2014   PLT 172 06/27/2014    Jasani Lengel A

## 2014-07-03 NOTE — Op Note (Signed)
07/03/2014  11:15 AM  PATIENT:  Rebecca [name]  68 y.o. female  PRE-OPERATIVE DIAGNOSIS:  STRICTURE OF VAGINA  POST-OPERATIVE DIAGNOSIS:  STRICTURE OF VAGINA  PROCEDURE:  Procedure(s): revision of POSTERIOR REPAIR (RECTOCELE) with bladder instillation  (N/A)  SURGEON:  Surgeon(s) and Role:    * Daria Pastures, MD - Primary   ANESTHESIA:   general  EBL:  Total I/O In: 1200 [I.V.:1200] Out: 50 [Urine:50]  LOCAL MEDICATIONS USED:  MARCAINE     SPECIMEN:  No Specimen  DISPOSITION OF SPECIMEN:  PATHOLOGY  COUNTS:  YES  TOURNIQUET:  * No tourniquets in log *  DICTATION: .Note written in EPIC  PLAN OF CARE: Discharge to home after PACU  PATIENT DISPOSITION:  PACU - hemodynamically stable.   Delay start of Pharmacological VTE agent (>24hrs) due to surgical blood loss or risk of bleeding: not applicable  Complications:  Meds:  1% lidocaine with epi, enfamil in bladder, estrace cream.  Technique:  After adequate anesthesia was achieved, the patient was prepped and draped in the usual fashion; the bladder was emptied with a red rubber catheter.  The perineum was grasped with allises and the midline was grasped all the way to the top of the rectocele with allises.  Lidocaine was injected submucosally and then incised in the midline vertically.  The mucosa over the stricture was then incised and then retracted off the rectovaginal fascia with sharp and blunt dissection- this was difficult and mobilization was minimal.  The rectum remained intact throughout the procedure and was checked with a gloved finger several times.  The peritoneum was entered into inadvertently at the top of the cuff without injury- this was closed with a figure of eight stitch of 2-0 vicryl.  Bladder was instilled with enfamil and was found to be intact. The rectovaginal fascia was still over most of the defect but some areas over the rectum were only covered rectovaginal fat .  The vaginal mucosa was then  closed from superior to inferior to reduce the stricture with a running lock stitch of 2-0 vicryl.  Two fingers were able to be inserted into vagina without stricture but at the expense of some vaginal length.  Since the vaginal mucosa was very stuck to the underlying rectovaginal fascia to the peritoneal cavity, it was difficult to mobilize any additional room side to side. I explained the repair to the husband.  A vaginal pack was placed with estrogen cream.  The patient tolerated the procedure well and was returned to the PACU in stable condition.  The pt will return in 2 days in office for removal of vaginal pack.  Rexann Lueras A

## 2014-07-03 NOTE — Discharge Instructions (Signed)
Pt must take Colace three times a day for at least 2 weeks.  Call if the vaginal pack falls out before Friday 1:30 pm- if in, rtc office on Fri at 1:30 pm for removal of vag pack.

## 2014-07-03 NOTE — OR Nursing (Signed)
Urgently to surgery with Dr Philis Pique and Dr Primitivo Gauze at patient's side. Rebecca [name],

## 2014-07-03 NOTE — Brief Op Note (Signed)
Dr Jonathon Jordan holding pressure in vaginal area where pt is bleeding.  Anesthesia here and gettting pt ready for surgery. [employer].  300-400cc bleed. Lucy Antigua, RN

## 2014-07-03 NOTE — Anesthesia Postprocedure Evaluation (Signed)
  Anesthesia Post-op Note Anesthesia Post Note  Patient: Rebecca [name]  Procedure(s) Performed: Procedure(s) (LRB): revision of POSTERIOR REPAIR (RECTOCELE) with bladder instillation  (N/A)  Anesthesia type: General  Patient location: PACU  Post pain: Pain level controlled  Post assessment: Post-op Vital signs reviewed  Last Vitals:  Filed Vitals:   07/03/14 1157  BP:   Pulse: 75  Temp: 36.4 C  Resp: 16    Post vital signs: Reviewed  Level of consciousness: sedated  Complications: Vaginal bleeding, ongoing.  Dr Philis Pique at bedside.  EBL approx 500 ml.  BP decreased from 638V to 95 systolic.  Patient less arousable and complains of being cold [facility] and warm blankets applied).  Headed back to OR now for reop.

## 2014-07-03 NOTE — Anesthesia Preprocedure Evaluation (Signed)
Anesthesia Evaluation  Patient identified by MRN, date of birth, ID band Patient awake    Reviewed: Allergy & Precautions, H&P , NPO status , Patient's Chart, lab work & pertinent test results, reviewed documented beta blocker date and time   History of Anesthesia Complications (+) history of anesthetic complications (h/o low BP after lap chole in [place])  Airway Mallampati: I TM Distance: >3 FB Neck ROM: full    Dental  (+) Edentulous Upper, Edentulous Lower, Upper Dentures, Lower Dentures   Pulmonary former smoker,  breath sounds clear to auscultation  Pulmonary exam normal       Cardiovascular Exercise Tolerance: Good negative cardio ROS  Rhythm:regular Rate:Normal     Neuro/Psych  Headaches, negative psych ROS   GI/Hepatic Neg liver ROS, GERD-  Medicated,  Endo/Other  negative endocrine ROS  Renal/GU negative Renal ROS  Female GU complaint     Musculoskeletal   Abdominal   Peds  Hematology H/o blood transfusions 1984 - for menorrhagia  Bleeding --> back for re-operation   Anesthesia Other Findings   Reproductive/Obstetrics negative OB ROS                           Anesthesia Physical  Anesthesia Plan  ASA: II and emergent  Anesthesia Plan: General LMA   Post-op Pain Management:    Induction:   Airway Management Planned:   Additional Equipment:   Intra-op Plan:   Post-operative Plan:   Informed Consent: I have reviewed the patients History and Physical, chart, labs and discussed the procedure including the risks, benefits and alternatives for the proposed anesthesia with the patient or authorized representative who has indicated his/her understanding and acceptance.   Dental Advisory Given  Plan Discussed with: CRNA and Surgeon  Anesthesia Plan Comments:         Anesthesia Quick Evaluation

## 2014-07-03 NOTE — Anesthesia Postprocedure Evaluation (Signed)
  Anesthesia Post-op Note  Anesthesia Post Note  Patient: Rebecca [name]  Procedure(s) Performed: Procedure(s) (LRB): POSTERIOR REPAIR (RECTOCELE) with suture repair for hemostasis  (N/A)  Anesthesia type: General  Patient location: PACU  Post pain: Pain level controlled  Post assessment: Post-op Vital signs reviewed  Last Vitals:  Filed Vitals:   07/03/14 1330  BP: 118/53  Pulse: 90  Temp:   Resp: 25    Post vital signs: Reviewed  Level of consciousness: sedated  Complications: No apparent anesthesia complications.  Assessed bleeding - is bleeding some, but minimal compared to earlier.  Pulse now elevated compared to earlier, but BP stable.  Reports pain is 6/10 at surgical site.  Will continue to assess.  Charlton Haws, MD

## 2014-07-03 NOTE — Transfer of Care (Signed)
Immediate Anesthesia Transfer of Care Note  Patient: Rebecca [name]  Procedure(s) Performed: Procedure(s): revision of POSTERIOR REPAIR (RECTOCELE) with bladder instillation  (N/A)  Patient Location: PACU  Anesthesia Type:General  Level of Consciousness: awake, alert , oriented and patient cooperative  Airway & Oxygen Therapy: Patient Spontanous Breathing and Patient connected to nasal cannula oxygen  Post-op Assessment: Report given to PACU RN and Post -op Vital signs reviewed and stable  Post vital signs: Reviewed and stable  Complications: No apparent anesthesia complications

## 2014-07-03 NOTE — Transfer of Care (Signed)
Immediate Anesthesia Transfer of Care Note  Patient: Rebecca [name]  Procedure(s) Performed: Procedure(s): POSTERIOR REPAIR (RECTOCELE) with suture repair for hemostasis  (N/A)  Patient Location: PACU  Anesthesia Type:General  Level of Consciousness: awake, sedated and patient cooperative  Airway & Oxygen Therapy: Patient Spontanous Breathing and Patient connected to nasal cannula oxygen  Post-op Assessment: Report given to PACU RN and Post -op Vital signs reviewed and stable  Post vital signs: Reviewed and stable  Complications: No apparent anesthesia complications

## 2014-07-04 ENCOUNTER — Encounter (HOSPITAL_COMMUNITY): Payer: Self-pay | Admitting: Obstetrics and Gynecology

## 2014-07-04 DIAGNOSIS — N895 Stricture and atresia of vagina: Secondary | ICD-10-CM | POA: Diagnosis not present

## 2014-07-04 LAB — BASIC METABOLIC PANEL
Anion gap: 8 (ref 5–15)
BUN: 9 mg/dL (ref 6–23)
CHLORIDE: 108 meq/L (ref 96–112)
CO2: 26 meq/L (ref 19–32)
CREATININE: 0.72 mg/dL (ref 0.50–1.10)
Calcium: 8.5 mg/dL (ref 8.4–10.5)
GFR calc Af Amer: >90 mL/min (ref >90)
GFR calc non Af Amer: 88 mL/min — ABNORMAL LOW (ref >90)
Glucose, Bld: 98 mg/dL (ref 70–99)
Potassium: 4.4 mEq/L (ref 3.7–5.3)
Sodium: 142 mEq/L (ref 137–147)

## 2014-07-04 LAB — CBC
HEMATOCRIT: 26.6 % — AB (ref 36.0–46.0)
Hemoglobin: 8.8 g/dL — ABNORMAL LOW (ref 12.0–15.0)
MCH: 31 pg (ref 26.0–34.0)
MCHC: 33.1 g/dL (ref 30.0–36.0)
MCV: 93.7 fL (ref 78.0–100.0)
Platelets: 126 10*3/uL — ABNORMAL LOW (ref 150–400)
RBC: 2.84 MIL/uL — AB (ref 3.87–5.11)
RDW: 11.9 % (ref 11.5–15.5)
WBC: 5.5 10*3/uL (ref 4.0–10.5)

## 2014-07-04 NOTE — Anesthesia Postprocedure Evaluation (Signed)
  Anesthesia Post-op Note  Patient: Rebecca [name]  Procedure(s) Performed: Procedure(s): POSTERIOR REPAIR (RECTOCELE) with suture repair for hemostasis  (N/A)  Patient Location: [facility]  Anesthesia Type:General  Level of Consciousness: awake, alert  and oriented  Airway and Oxygen Therapy: Patient Spontanous Breathing  Post-op Pain: none  Post-op Assessment: Post-op Vital signs reviewed, Patient's Cardiovascular Status Stable, Respiratory Function Stable, No signs of Nausea or vomiting and Pain level controlled  Post-op Vital Signs: Reviewed and stable  Last Vitals:  Filed Vitals:   07/04/14 0601  BP: 80/35  Pulse: 70  Temp: 36.8 C  Resp: 16    Complications: No apparent anesthesia complications

## 2014-07-04 NOTE — Addendum Note (Signed)
Addendum created 07/04/14 1011 by Jonna Munro, CRNA   Modules edited: Notes Section   Notes Section:  File: [id number]

## 2014-07-04 NOTE — Progress Notes (Signed)
Patient is eating, ambulating, not voided yet- foley out at 6:05.  Pain control is good.  BP 80/35  Pulse 70  Temp(Src) 98.2 F (36.8 C) (Oral)  Resp 16  SpO2 96%  lungs:   clear to auscultation cor:    RRR Abdomen:  soft, appropriate tenderness, incisions intact and without erythema or exudate. ex:    no cords   Results for orders placed during the hospital encounter of 07/03/14 (from the past 24 hour(s))  PREPARE RBC (CROSSMATCH)     Status: None   Collection Time    07/03/14 11:58 AM      Result Value Ref Range   Order Confirmation ORDER PROCESSED BY BLOOD BANK    TYPE AND SCREEN     Status: None   Collection Time    07/03/14 12:00 PM      Result Value Ref Range   ABO/RH(D) A NEG     Antibody Screen NEG     Sample Expiration 07/06/2014     Unit Number [id number]     Blood Component Type RED CELLS,LR     Unit division 00     Status of Unit ALLOCATED     Transfusion Status OK TO TRANSFUSE     Crossmatch Result Compatible     Unit Number [id number]     Blood Component Type RBC LR PHER2     Unit division 00     Status of Unit ALLOCATED     Transfusion Status OK TO TRANSFUSE     Crossmatch Result Compatible    CBC     Status: Abnormal   Collection Time    07/03/14 12:00 PM      Result Value Ref Range   WBC 4.1  4.0 - 10.5 K/uL   RBC 3.50 (*) 3.87 - 5.11 MIL/uL   Hemoglobin 10.9 (*) 12.0 - 15.0 g/dL   HCT 32.6 (*) 36.0 - 46.0 %   MCV 93.1  78.0 - 100.0 fL   MCH 31.1  26.0 - 34.0 pg   MCHC 33.4  30.0 - 36.0 g/dL   RDW 12.0  11.5 - 15.5 %   Platelets 138 (*) 150 - 400 K/uL  ABO/RH     Status: None   Collection Time    07/03/14 12:00 PM      Result Value Ref Range   ABO/RH(D) A NEG    CBC     Status: Abnormal   Collection Time    07/04/14  5:13 AM      Result Value Ref Range   WBC 5.5  4.0 - 10.5 K/uL   RBC 2.84 (*) 3.87 - 5.11 MIL/uL   Hemoglobin 8.8 (*) 12.0 - 15.0 g/dL   HCT 26.6 (*) 36.0 - 46.0 %   MCV 93.7  78.0 - 100.0 fL   MCH 31.0  26.0 - 34.0  pg   MCHC 33.1  30.0 - 36.0 g/dL   RDW 11.9  11.5 - 15.5 %   Platelets 126 (*) 150 - 400 K/uL  BASIC METABOLIC PANEL     Status: Abnormal   Collection Time    07/04/14  5:13 AM      Result Value Ref Range   Sodium 142  137 - 147 mEq/L   Potassium 4.4  3.7 - 5.3 mEq/L   Chloride 108  96 - 112 mEq/L   CO2 26  19 - 32 mEq/L   Glucose, Bld 98  70 - 99 mg/dL   BUN 9  6 - 23 mg/dL   Creatinine, Ser 0.72  0.50 - 1.10 mg/dL   Calcium 8.5  8.4 - 10.5 mg/dL   GFR calc non Af Amer 88 (*) >90 mL/min   GFR calc Af Amer >90  >90 mL/min   Anion gap 8  5 - 15    A/P  Routine care.  Expect d/c per plan.  Vag pack removed.  No more bleeding.  Percocet for pain control.  D/c when voiding.

## 2014-07-04 NOTE — Addendum Note (Signed)
Addendum created 07/04/14 1012 by Jonna Munro, CRNA   Modules edited: Notes Section   Notes Section:  File: [id number]

## 2014-07-04 NOTE — Anesthesia Postprocedure Evaluation (Signed)
  Anesthesia Post-op Note  Patient: Rebecca [name]  Procedure(s) Performed: Procedure(s): revision of POSTERIOR REPAIR (RECTOCELE) with bladder instillation  (N/A)  Patient Location: [facility] Unit  Anesthesia Type:General  Level of Consciousness: awake, alert  and oriented  Airway and Oxygen Therapy: Patient Spontanous Breathing  Post-op Pain: none  Post-op Assessment: Post-op Vital signs reviewed, Patient's Cardiovascular Status Stable, Respiratory Function Stable, No signs of Nausea or vomiting and Pain level controlled  Post-op Vital Signs: Reviewed and stable  Last Vitals:  Filed Vitals:   07/04/14 0601  BP: 80/35  Pulse: 70  Temp: 36.8 C  Resp: 16    Complications: No apparent anesthesia complications

## 2014-07-04 NOTE — Discharge Summary (Signed)
Physician Discharge Summary  Patient ID: Rebecca [name] MRN: [id number] DOB/AGE: May 14, 1946 68 y.o.  Admit date: 07/03/2014 Discharge date: 07/04/2014  Admission Diagnoses:vaginal stricture  Discharge Diagnoses: same plus hemorrhage Active Problems:   Postoperative state   Discharged Condition: good  Hospital Course: Pt underwent revision of posterior repair and reduction of vaginal stricture.  In the PACU she began bleeding briskly and was returned to the OR.  The bleeding vessel was finally located under the edge of the repair and ligated.  Pt was kept o/n for observation.  H/H dropped but not excessive.  Pt doing well post op this am and ready to go home.   Consults: None  Significant Diagnostic Studies: labs:  Results for orders placed during the hospital encounter of 07/03/14 (from the past 24 hour(s))  PREPARE RBC (CROSSMATCH)     Status: None   Collection Time    07/03/14 11:58 AM      Result Value Ref Range   Order Confirmation ORDER PROCESSED BY BLOOD BANK    TYPE AND SCREEN     Status: None   Collection Time    07/03/14 12:00 PM      Result Value Ref Range   ABO/RH(D) A NEG     Antibody Screen NEG     Sample Expiration 07/06/2014     Unit Number [id number]     Blood Component Type RED CELLS,LR     Unit division 00     Status of Unit ALLOCATED     Transfusion Status OK TO TRANSFUSE     Crossmatch Result Compatible     Unit Number [id number]     Blood Component Type RBC LR PHER2     Unit division 00     Status of Unit ALLOCATED     Transfusion Status OK TO TRANSFUSE     Crossmatch Result Compatible    CBC     Status: Abnormal   Collection Time    07/03/14 12:00 PM      Result Value Ref Range   WBC 4.1  4.0 - 10.5 K/uL   RBC 3.50 (*) 3.87 - 5.11 MIL/uL   Hemoglobin 10.9 (*) 12.0 - 15.0 g/dL   HCT 32.6 (*) 36.0 - 46.0 %   MCV 93.1  78.0 - 100.0 fL   MCH 31.1  26.0 - 34.0 pg   MCHC 33.4  30.0 - 36.0 g/dL   RDW 12.0  11.5 - 15.5 %   Platelets 138 (*)  150 - 400 K/uL  ABO/RH     Status: None   Collection Time    07/03/14 12:00 PM      Result Value Ref Range   ABO/RH(D) A NEG    CBC     Status: Abnormal   Collection Time    07/04/14  5:13 AM      Result Value Ref Range   WBC 5.5  4.0 - 10.5 K/uL   RBC 2.84 (*) 3.87 - 5.11 MIL/uL   Hemoglobin 8.8 (*) 12.0 - 15.0 g/dL   HCT 26.6 (*) 36.0 - 46.0 %   MCV 93.7  78.0 - 100.0 fL   MCH 31.0  26.0 - 34.0 pg   MCHC 33.1  30.0 - 36.0 g/dL   RDW 11.9  11.5 - 15.5 %   Platelets 126 (*) 150 - 400 K/uL  BASIC METABOLIC PANEL     Status: Abnormal   Collection Time    07/04/14  5:13 AM  Result Value Ref Range   Sodium 142  137 - 147 mEq/L   Potassium 4.4  3.7 - 5.3 mEq/L   Chloride 108  96 - 112 mEq/L   CO2 26  19 - 32 mEq/L   Glucose, Bld 98  70 - 99 mg/dL   BUN 9  6 - 23 mg/dL   Creatinine, Ser 0.72  0.50 - 1.10 mg/dL   Calcium 8.5  8.4 - 10.5 mg/dL   GFR calc non Af Amer 88 (*) >90 mL/min   GFR calc Af Amer >90  >90 mL/min   Anion gap 8  5 - 15    Treatments: surgery: revision of posterior repair, ligation of bleeding vessel  Discharge Exam: Blood pressure 80/35, pulse 70, temperature 98.2 F (36.8 C), temperature source Oral, resp. rate 16, SpO2 96.00%.   Disposition: 01-Home or Self Care  Discharge Instructions   Call MD for:  temperature >100.4    Complete by:  As directed      Call MD for:  temperature >100.4    Complete by:  As directed      Diet - low sodium heart healthy    Complete by:  As directed      Diet - low sodium heart healthy    Complete by:  As directed      Discharge instructions    Complete by:  As directed   Routine activity.  Take over the counter ibuprophen up to 800 mg every 8 hours for pain.     Discharge instructions    Complete by:  As directed   No driving on narcotics, no sexual activity for 2 weeks.     Discharge wound care:    Complete by:  As directed   Sitz baths and icepacks to perineum.  If stitches, they will dissolve.      Increase activity slowly    Complete by:  As directed      May shower / Bathe    Complete by:  As directed   Shower, no bath for 2 weeks.     Remove dressing in 24 hours    Complete by:  As directed      Sexual Activity Restrictions    Complete by:  As directed   No sexual activity for 2 weeks.     Sexual acrtivity    Complete by:  As directed   Nothing per vagina for 6 weeks.            Medication List         b complex vitamins tablet  Take 1 tablet by mouth daily.     cyclobenzaprine 10 MG tablet  Commonly known as:  FLEXERIL  Take 10 mg by mouth 3 (three) times daily as needed for muscle spasms.     multivitamin with minerals Tabs tablet  Take 1 tablet by mouth daily.     omeprazole 40 MG capsule  Commonly known as:  PRILOSEC  Take 40 mg by mouth daily.     oxyCODONE-acetaminophen 5-325 MG per tablet  Commonly known as:  ROXICET  Take 1 tablet by mouth every 4 (four) hours as needed for severe pain.     traZODone 50 MG tablet  Commonly known as:  DESYREL  Take 50 mg by mouth at bedtime.     Vitamin D 2000 UNITS tablet  Take 2,000 Units by mouth daily.     zonisamide 50 MG capsule  Commonly known as:  ZONEGRAN  Take  50-100 mg by mouth 2 (two) times daily. Pt reports taking two tablets in the morning and one at night.           Follow-up Information   Follow up with Alainna Stawicki A, MD In 2 days. (for vaginal pack removal)    Specialty:  Obstetrics and Gynecology   Contact information:   [place] [address] [phone]       Follow up with Wilder Kurowski A, MD In 1 week.   Specialty:  Obstetrics and Gynecology   Contact information:   [place] [address] [phone]       Signed: Tiwana Chavis A 07/04/2014, 7:34 AM

## 2014-07-04 NOTE — Progress Notes (Signed)
Pt discharged to home with husband.  Condition stable.  Pt ambulated to car with L. Graylon Good, Therapist, sports.  No equipment for home ordered at discharge.

## 2014-07-07 LAB — TYPE AND SCREEN
ABO/RH(D): A NEG
Antibody Screen: NEGATIVE
UNIT DIVISION: 0
Unit division: 0

## 2014-07-11 DIAGNOSIS — D649 Anemia, unspecified: Secondary | ICD-10-CM | POA: Diagnosis not present

## 2014-08-07 ENCOUNTER — Other Ambulatory Visit: Payer: Self-pay

## 2014-08-07 DIAGNOSIS — Z1231 Encounter for screening mammogram for malignant neoplasm of breast: Secondary | ICD-10-CM

## 2014-09-10 ENCOUNTER — Ambulatory Visit
Admission: RE | Admit: 2014-09-10 | Discharge: 2014-09-10 | Disposition: A | Discharge status: Home / Self Care | Payer: Medicare Other | Source: Ambulatory Visit

## 2014-09-10 ENCOUNTER — Encounter (INDEPENDENT_AMBULATORY_CARE_PROVIDER_SITE_OTHER): Payer: Self-pay

## 2014-09-10 DIAGNOSIS — G43719 Chronic migraine without aura, intractable, without status migrainosus: Secondary | ICD-10-CM | POA: Diagnosis not present

## 2014-09-10 DIAGNOSIS — G43019 Migraine without aura, intractable, without status migrainosus: Secondary | ICD-10-CM | POA: Diagnosis not present

## 2014-09-10 DIAGNOSIS — Z1231 Encounter for screening mammogram for malignant neoplasm of breast: Secondary | ICD-10-CM | POA: Diagnosis not present

## 2014-12-09 DIAGNOSIS — G43019 Migraine without aura, intractable, without status migrainosus: Secondary | ICD-10-CM | POA: Diagnosis not present

## 2014-12-09 DIAGNOSIS — G43719 Chronic migraine without aura, intractable, without status migrainosus: Secondary | ICD-10-CM | POA: Diagnosis not present

## 2014-12-24 DIAGNOSIS — G43109 Migraine with aura, not intractable, without status migrainosus: Secondary | ICD-10-CM | POA: Diagnosis not present

## 2014-12-24 DIAGNOSIS — K210 Gastro-esophageal reflux disease with esophagitis: Secondary | ICD-10-CM | POA: Diagnosis not present

## 2014-12-24 DIAGNOSIS — M79609 Pain in unspecified limb: Secondary | ICD-10-CM | POA: Diagnosis not present

## 2015-01-08 DIAGNOSIS — Z23 Encounter for immunization: Secondary | ICD-10-CM | POA: Diagnosis not present

## 2015-01-22 DIAGNOSIS — M79605 Pain in left leg: Secondary | ICD-10-CM | POA: Diagnosis not present

## 2015-01-22 DIAGNOSIS — I8311 Varicose veins of right lower extremity with inflammation: Secondary | ICD-10-CM | POA: Diagnosis not present

## 2015-01-22 DIAGNOSIS — I8312 Varicose veins of left lower extremity with inflammation: Secondary | ICD-10-CM | POA: Diagnosis not present

## 2015-01-28 DIAGNOSIS — I8311 Varicose veins of right lower extremity with inflammation: Secondary | ICD-10-CM | POA: Diagnosis not present

## 2015-01-28 DIAGNOSIS — I8312 Varicose veins of left lower extremity with inflammation: Secondary | ICD-10-CM | POA: Diagnosis not present

## 2015-02-06 DIAGNOSIS — L0293 Carbuncle, unspecified: Secondary | ICD-10-CM | POA: Diagnosis not present

## 2015-03-18 DIAGNOSIS — R7989 Other specified abnormal findings of blood chemistry: Secondary | ICD-10-CM | POA: Diagnosis not present

## 2015-03-18 DIAGNOSIS — D72819 Decreased white blood cell count, unspecified: Secondary | ICD-10-CM | POA: Diagnosis not present

## 2015-03-18 DIAGNOSIS — J40 Bronchitis, not specified as acute or chronic: Secondary | ICD-10-CM | POA: Diagnosis not present

## 2015-03-18 DIAGNOSIS — R079 Chest pain, unspecified: Secondary | ICD-10-CM | POA: Diagnosis not present

## 2015-03-18 DIAGNOSIS — R05 Cough: Secondary | ICD-10-CM | POA: Diagnosis not present

## 2015-03-25 DIAGNOSIS — R040 Epistaxis: Secondary | ICD-10-CM | POA: Diagnosis not present

## 2015-04-03 DIAGNOSIS — R079 Chest pain, unspecified: Secondary | ICD-10-CM | POA: Diagnosis not present

## 2015-04-03 DIAGNOSIS — R0602 Shortness of breath: Secondary | ICD-10-CM | POA: Diagnosis not present

## 2015-04-03 DIAGNOSIS — R062 Wheezing: Secondary | ICD-10-CM | POA: Diagnosis not present

## 2015-04-10 DIAGNOSIS — G43719 Chronic migraine without aura, intractable, without status migrainosus: Secondary | ICD-10-CM | POA: Diagnosis not present

## 2015-04-10 DIAGNOSIS — G43019 Migraine without aura, intractable, without status migrainosus: Secondary | ICD-10-CM | POA: Diagnosis not present

## 2015-04-16 DIAGNOSIS — R911 Solitary pulmonary nodule: Secondary | ICD-10-CM | POA: Diagnosis not present

## 2015-04-16 DIAGNOSIS — R091 Pleurisy: Secondary | ICD-10-CM | POA: Diagnosis not present

## 2015-04-16 DIAGNOSIS — R05 Cough: Secondary | ICD-10-CM | POA: Diagnosis not present

## 2015-04-16 DIAGNOSIS — J439 Emphysema, unspecified: Secondary | ICD-10-CM | POA: Diagnosis not present

## 2015-04-16 DIAGNOSIS — R918 Other nonspecific abnormal finding of lung field: Secondary | ICD-10-CM | POA: Diagnosis not present

## 2015-05-16 DIAGNOSIS — G4762 Sleep related leg cramps: Secondary | ICD-10-CM | POA: Diagnosis not present

## 2015-05-16 DIAGNOSIS — M546 Pain in thoracic spine: Secondary | ICD-10-CM | POA: Diagnosis not present

## 2015-05-19 DIAGNOSIS — G4762 Sleep related leg cramps: Secondary | ICD-10-CM | POA: Diagnosis not present

## 2015-06-04 DIAGNOSIS — M546 Pain in thoracic spine: Secondary | ICD-10-CM | POA: Diagnosis not present

## 2015-06-04 DIAGNOSIS — G4762 Sleep related leg cramps: Secondary | ICD-10-CM | POA: Diagnosis not present

## 2015-07-23 DIAGNOSIS — R21 Rash and other nonspecific skin eruption: Secondary | ICD-10-CM | POA: Diagnosis not present

## 2015-07-23 DIAGNOSIS — L821 Other seborrheic keratosis: Secondary | ICD-10-CM | POA: Diagnosis not present

## 2015-07-23 DIAGNOSIS — L03116 Cellulitis of left lower limb: Secondary | ICD-10-CM | POA: Diagnosis not present

## 2015-07-30 DIAGNOSIS — J9600 Acute respiratory failure, unspecified whether with hypoxia or hypercapnia: Secondary | ICD-10-CM | POA: Diagnosis not present

## 2015-07-30 DIAGNOSIS — Z9981 Dependence on supplemental oxygen: Secondary | ICD-10-CM | POA: Diagnosis not present

## 2015-07-30 DIAGNOSIS — I2602 Saddle embolus of pulmonary artery with acute cor pulmonale: Secondary | ICD-10-CM | POA: Diagnosis not present

## 2015-07-30 DIAGNOSIS — R0902 Hypoxemia: Secondary | ICD-10-CM | POA: Diagnosis not present

## 2015-07-30 DIAGNOSIS — I82403 Acute embolism and thrombosis of unspecified deep veins of lower extremity, bilateral: Secondary | ICD-10-CM | POA: Diagnosis not present

## 2015-07-30 DIAGNOSIS — I2699 Other pulmonary embolism without acute cor pulmonale: Secondary | ICD-10-CM | POA: Diagnosis not present

## 2015-07-31 DIAGNOSIS — I2699 Other pulmonary embolism without acute cor pulmonale: Secondary | ICD-10-CM | POA: Diagnosis not present

## 2015-07-31 DIAGNOSIS — I82403 Acute embolism and thrombosis of unspecified deep veins of lower extremity, bilateral: Secondary | ICD-10-CM | POA: Diagnosis not present

## 2015-07-31 DIAGNOSIS — I2602 Saddle embolus of pulmonary artery with acute cor pulmonale: Secondary | ICD-10-CM | POA: Diagnosis not present

## 2015-07-31 DIAGNOSIS — R0902 Hypoxemia: Secondary | ICD-10-CM | POA: Diagnosis not present

## 2015-07-31 DIAGNOSIS — Z9981 Dependence on supplemental oxygen: Secondary | ICD-10-CM | POA: Diagnosis not present

## 2015-07-31 DIAGNOSIS — J9600 Acute respiratory failure, unspecified whether with hypoxia or hypercapnia: Secondary | ICD-10-CM | POA: Diagnosis not present

## 2015-08-01 DIAGNOSIS — I82403 Acute embolism and thrombosis of unspecified deep veins of lower extremity, bilateral: Secondary | ICD-10-CM | POA: Diagnosis not present

## 2015-08-01 DIAGNOSIS — R0902 Hypoxemia: Secondary | ICD-10-CM | POA: Diagnosis not present

## 2015-08-01 DIAGNOSIS — I2692 Saddle embolus of pulmonary artery without acute cor pulmonale: Secondary | ICD-10-CM | POA: Diagnosis not present

## 2015-08-08 DIAGNOSIS — R110 Nausea: Secondary | ICD-10-CM | POA: Diagnosis not present

## 2015-08-08 DIAGNOSIS — R5381 Other malaise: Secondary | ICD-10-CM | POA: Diagnosis not present

## 2015-08-08 DIAGNOSIS — R1013 Epigastric pain: Secondary | ICD-10-CM | POA: Diagnosis not present

## 2015-08-08 DIAGNOSIS — R1900 Intra-abdominal and pelvic swelling, mass and lump, unspecified site: Secondary | ICD-10-CM | POA: Diagnosis not present

## 2015-08-11 ENCOUNTER — Other Ambulatory Visit: Payer: Self-pay

## 2015-08-11 DIAGNOSIS — G43019 Migraine without aura, intractable, without status migrainosus: Secondary | ICD-10-CM | POA: Diagnosis not present

## 2015-08-11 DIAGNOSIS — Z1231 Encounter for screening mammogram for malignant neoplasm of breast: Secondary | ICD-10-CM

## 2015-08-11 DIAGNOSIS — G43719 Chronic migraine without aura, intractable, without status migrainosus: Secondary | ICD-10-CM | POA: Diagnosis not present

## 2015-08-11 DIAGNOSIS — R51 Headache: Secondary | ICD-10-CM | POA: Diagnosis not present

## 2015-08-11 DIAGNOSIS — Z79899 Other long term (current) drug therapy: Secondary | ICD-10-CM | POA: Diagnosis not present

## 2015-09-12 ENCOUNTER — Ambulatory Visit

## 2015-09-18 ENCOUNTER — Ambulatory Visit
Admission: RE | Admit: 2015-09-18 | Discharge: 2015-09-18 | Disposition: A | Discharge status: Home / Self Care | Payer: Medicare Other | Source: Ambulatory Visit

## 2015-09-18 DIAGNOSIS — Z1231 Encounter for screening mammogram for malignant neoplasm of breast: Secondary | ICD-10-CM

## 2015-09-23 DIAGNOSIS — Z23 Encounter for immunization: Secondary | ICD-10-CM | POA: Diagnosis not present

## 2015-09-23 DIAGNOSIS — R319 Hematuria, unspecified: Secondary | ICD-10-CM | POA: Diagnosis not present

## 2015-09-25 DIAGNOSIS — R319 Hematuria, unspecified: Secondary | ICD-10-CM | POA: Diagnosis not present

## 2015-09-25 DIAGNOSIS — M549 Dorsalgia, unspecified: Secondary | ICD-10-CM | POA: Diagnosis not present

## 2015-09-29 DIAGNOSIS — M545 Low back pain: Secondary | ICD-10-CM | POA: Diagnosis not present

## 2015-09-29 DIAGNOSIS — M5134 Other intervertebral disc degeneration, thoracic region: Secondary | ICD-10-CM | POA: Diagnosis not present

## 2015-09-29 DIAGNOSIS — M8588 Other specified disorders of bone density and structure, other site: Secondary | ICD-10-CM | POA: Diagnosis not present

## 2015-09-29 DIAGNOSIS — M47816 Spondylosis without myelopathy or radiculopathy, lumbar region: Secondary | ICD-10-CM | POA: Diagnosis not present

## 2015-10-08 DIAGNOSIS — M549 Dorsalgia, unspecified: Secondary | ICD-10-CM | POA: Diagnosis not present

## 2015-10-08 DIAGNOSIS — R319 Hematuria, unspecified: Secondary | ICD-10-CM | POA: Diagnosis not present

## 2015-10-08 DIAGNOSIS — N200 Calculus of kidney: Secondary | ICD-10-CM | POA: Diagnosis not present

## 2015-10-17 ENCOUNTER — Other Ambulatory Visit: Payer: Self-pay | Admitting: Internal Medicine

## 2015-10-17 DIAGNOSIS — M545 Low back pain, unspecified: Secondary | ICD-10-CM

## 2015-10-27 ENCOUNTER — Ambulatory Visit
Admission: RE | Admit: 2015-10-27 | Discharge: 2015-10-27 | Disposition: A | Discharge status: Home / Self Care | Payer: Medicare Other | Source: Ambulatory Visit | Attending: Internal Medicine | Admitting: Internal Medicine

## 2015-10-27 DIAGNOSIS — M545 Low back pain, unspecified: Secondary | ICD-10-CM

## 2015-10-27 DIAGNOSIS — M5126 Other intervertebral disc displacement, lumbar region: Secondary | ICD-10-CM | POA: Diagnosis not present

## 2015-10-30 DIAGNOSIS — N3000 Acute cystitis without hematuria: Secondary | ICD-10-CM | POA: Diagnosis not present

## 2015-10-30 DIAGNOSIS — M545 Low back pain: Secondary | ICD-10-CM | POA: Diagnosis not present

## 2015-10-30 DIAGNOSIS — N200 Calculus of kidney: Secondary | ICD-10-CM | POA: Diagnosis not present

## 2015-11-01 DIAGNOSIS — K219 Gastro-esophageal reflux disease without esophagitis: Secondary | ICD-10-CM | POA: Diagnosis not present

## 2015-11-01 DIAGNOSIS — Z7982 Long term (current) use of aspirin: Secondary | ICD-10-CM | POA: Diagnosis not present

## 2015-11-01 DIAGNOSIS — Z79899 Other long term (current) drug therapy: Secondary | ICD-10-CM | POA: Diagnosis not present

## 2015-11-01 DIAGNOSIS — M5416 Radiculopathy, lumbar region: Secondary | ICD-10-CM | POA: Diagnosis not present

## 2015-11-01 DIAGNOSIS — M1990 Unspecified osteoarthritis, unspecified site: Secondary | ICD-10-CM | POA: Diagnosis not present

## 2015-11-01 DIAGNOSIS — M545 Low back pain: Secondary | ICD-10-CM | POA: Diagnosis not present

## 2015-12-17 DIAGNOSIS — G43719 Chronic migraine without aura, intractable, without status migrainosus: Secondary | ICD-10-CM | POA: Diagnosis not present

## 2015-12-17 DIAGNOSIS — G43019 Migraine without aura, intractable, without status migrainosus: Secondary | ICD-10-CM | POA: Diagnosis not present

## 2015-12-19 DIAGNOSIS — R05 Cough: Secondary | ICD-10-CM | POA: Diagnosis not present

## 2015-12-19 DIAGNOSIS — J40 Bronchitis, not specified as acute or chronic: Secondary | ICD-10-CM | POA: Diagnosis not present

## 2016-01-01 DIAGNOSIS — H524 Presbyopia: Secondary | ICD-10-CM | POA: Diagnosis not present

## 2016-01-01 DIAGNOSIS — H2513 Age-related nuclear cataract, bilateral: Secondary | ICD-10-CM | POA: Diagnosis not present

## 2016-01-01 DIAGNOSIS — H04123 Dry eye syndrome of bilateral lacrimal glands: Secondary | ICD-10-CM | POA: Diagnosis not present

## 2016-01-06 DIAGNOSIS — M5134 Other intervertebral disc degeneration, thoracic region: Secondary | ICD-10-CM | POA: Diagnosis not present

## 2016-01-06 DIAGNOSIS — I83813 Varicose veins of bilateral lower extremities with pain: Secondary | ICD-10-CM | POA: Diagnosis not present

## 2016-01-06 DIAGNOSIS — R109 Unspecified abdominal pain: Secondary | ICD-10-CM | POA: Diagnosis not present

## 2016-01-15 DIAGNOSIS — R6881 Early satiety: Secondary | ICD-10-CM | POA: Diagnosis not present

## 2016-01-20 ENCOUNTER — Other Ambulatory Visit: Payer: Self-pay | Admitting: Internal Medicine

## 2016-01-20 DIAGNOSIS — M5134 Other intervertebral disc degeneration, thoracic region: Secondary | ICD-10-CM

## 2016-01-30 ENCOUNTER — Ambulatory Visit
Admission: RE | Admit: 2016-01-30 | Discharge: 2016-01-30 | Disposition: A | Discharge status: Home / Self Care | Payer: Medicare Other | Source: Ambulatory Visit | Attending: Internal Medicine | Admitting: Internal Medicine

## 2016-01-30 DIAGNOSIS — M5124 Other intervertebral disc displacement, thoracic region: Secondary | ICD-10-CM | POA: Diagnosis not present

## 2016-01-30 DIAGNOSIS — M5134 Other intervertebral disc degeneration, thoracic region: Secondary | ICD-10-CM

## 2016-02-03 DIAGNOSIS — Z124 Encounter for screening for malignant neoplasm of cervix: Secondary | ICD-10-CM | POA: Diagnosis not present

## 2016-02-03 DIAGNOSIS — Z01419 Encounter for gynecological examination (general) (routine) without abnormal findings: Secondary | ICD-10-CM | POA: Diagnosis not present

## 2016-02-13 DIAGNOSIS — K219 Gastro-esophageal reflux disease without esophagitis: Secondary | ICD-10-CM | POA: Diagnosis not present

## 2016-02-13 DIAGNOSIS — R1032 Left lower quadrant pain: Secondary | ICD-10-CM | POA: Diagnosis not present

## 2016-02-13 DIAGNOSIS — R1013 Epigastric pain: Secondary | ICD-10-CM | POA: Diagnosis not present

## 2016-02-19 DIAGNOSIS — N200 Calculus of kidney: Secondary | ICD-10-CM | POA: Diagnosis not present

## 2016-02-19 DIAGNOSIS — R634 Abnormal weight loss: Secondary | ICD-10-CM | POA: Diagnosis not present

## 2016-03-03 DIAGNOSIS — R634 Abnormal weight loss: Secondary | ICD-10-CM | POA: Diagnosis not present

## 2016-03-03 DIAGNOSIS — F329 Major depressive disorder, single episode, unspecified: Secondary | ICD-10-CM | POA: Diagnosis not present

## 2016-03-03 DIAGNOSIS — Z9049 Acquired absence of other specified parts of digestive tract: Secondary | ICD-10-CM | POA: Diagnosis not present

## 2016-03-03 DIAGNOSIS — D131 Benign neoplasm of stomach: Secondary | ICD-10-CM | POA: Diagnosis not present

## 2016-03-03 DIAGNOSIS — R1013 Epigastric pain: Secondary | ICD-10-CM | POA: Diagnosis not present

## 2016-03-03 DIAGNOSIS — K449 Diaphragmatic hernia without obstruction or gangrene: Secondary | ICD-10-CM | POA: Diagnosis not present

## 2016-03-03 DIAGNOSIS — K580 Irritable bowel syndrome with diarrhea: Secondary | ICD-10-CM | POA: Diagnosis not present

## 2016-03-03 DIAGNOSIS — F419 Anxiety disorder, unspecified: Secondary | ICD-10-CM | POA: Diagnosis not present

## 2016-03-03 DIAGNOSIS — Z79899 Other long term (current) drug therapy: Secondary | ICD-10-CM | POA: Diagnosis not present

## 2016-03-03 DIAGNOSIS — Z86010 Personal history of colonic polyps: Secondary | ICD-10-CM | POA: Diagnosis not present

## 2016-03-03 DIAGNOSIS — K219 Gastro-esophageal reflux disease without esophagitis: Secondary | ICD-10-CM | POA: Diagnosis not present

## 2016-03-03 DIAGNOSIS — K317 Polyp of stomach and duodenum: Secondary | ICD-10-CM | POA: Diagnosis not present

## 2016-03-03 DIAGNOSIS — K2970 Gastritis, unspecified, without bleeding: Secondary | ICD-10-CM | POA: Diagnosis not present

## 2016-03-03 HISTORY — PX: ESOPHAGOGASTRODUODENOSCOPY: SHX1529

## 2016-03-30 DIAGNOSIS — R102 Pelvic and perineal pain: Secondary | ICD-10-CM | POA: Diagnosis not present

## 2016-03-30 DIAGNOSIS — N390 Urinary tract infection, site not specified: Secondary | ICD-10-CM | POA: Diagnosis not present

## 2016-03-30 DIAGNOSIS — R8271 Bacteriuria: Secondary | ICD-10-CM | POA: Diagnosis not present

## 2016-04-07 DIAGNOSIS — B001 Herpesviral vesicular dermatitis: Secondary | ICD-10-CM | POA: Diagnosis not present

## 2016-04-07 DIAGNOSIS — R0781 Pleurodynia: Secondary | ICD-10-CM | POA: Diagnosis not present

## 2016-04-13 DIAGNOSIS — N132 Hydronephrosis with renal and ureteral calculous obstruction: Secondary | ICD-10-CM | POA: Diagnosis not present

## 2016-04-13 DIAGNOSIS — N201 Calculus of ureter: Secondary | ICD-10-CM | POA: Diagnosis not present

## 2016-04-15 DIAGNOSIS — R3121 Asymptomatic microscopic hematuria: Secondary | ICD-10-CM | POA: Diagnosis not present

## 2016-04-15 DIAGNOSIS — N1330 Unspecified hydronephrosis: Secondary | ICD-10-CM | POA: Diagnosis not present

## 2016-04-15 DIAGNOSIS — N200 Calculus of kidney: Secondary | ICD-10-CM | POA: Diagnosis not present

## 2016-04-15 DIAGNOSIS — N3021 Other chronic cystitis with hematuria: Secondary | ICD-10-CM | POA: Diagnosis not present

## 2016-04-15 DIAGNOSIS — N23 Unspecified renal colic: Secondary | ICD-10-CM | POA: Diagnosis not present

## 2016-04-20 DIAGNOSIS — R3121 Asymptomatic microscopic hematuria: Secondary | ICD-10-CM | POA: Diagnosis not present

## 2016-04-20 DIAGNOSIS — N1330 Unspecified hydronephrosis: Secondary | ICD-10-CM | POA: Diagnosis not present

## 2016-04-20 DIAGNOSIS — N3021 Other chronic cystitis with hematuria: Secondary | ICD-10-CM | POA: Diagnosis not present

## 2016-04-20 DIAGNOSIS — N200 Calculus of kidney: Secondary | ICD-10-CM | POA: Diagnosis not present

## 2016-04-20 DIAGNOSIS — N23 Unspecified renal colic: Secondary | ICD-10-CM | POA: Diagnosis not present

## 2016-04-23 DIAGNOSIS — N200 Calculus of kidney: Secondary | ICD-10-CM | POA: Diagnosis not present

## 2016-04-29 DIAGNOSIS — N201 Calculus of ureter: Secondary | ICD-10-CM | POA: Diagnosis not present

## 2016-05-03 DIAGNOSIS — N3021 Other chronic cystitis with hematuria: Secondary | ICD-10-CM | POA: Diagnosis not present

## 2016-05-03 DIAGNOSIS — N23 Unspecified renal colic: Secondary | ICD-10-CM | POA: Diagnosis not present

## 2016-05-03 DIAGNOSIS — M545 Low back pain: Secondary | ICD-10-CM | POA: Diagnosis not present

## 2016-05-03 DIAGNOSIS — R3121 Asymptomatic microscopic hematuria: Secondary | ICD-10-CM | POA: Diagnosis not present

## 2016-05-03 DIAGNOSIS — D510 Vitamin B12 deficiency anemia due to intrinsic factor deficiency: Secondary | ICD-10-CM | POA: Diagnosis not present

## 2016-05-03 DIAGNOSIS — N200 Calculus of kidney: Secondary | ICD-10-CM | POA: Diagnosis not present

## 2016-05-03 DIAGNOSIS — R5383 Other fatigue: Secondary | ICD-10-CM | POA: Diagnosis not present

## 2016-05-03 DIAGNOSIS — R634 Abnormal weight loss: Secondary | ICD-10-CM | POA: Diagnosis not present

## 2016-05-18 DIAGNOSIS — G43719 Chronic migraine without aura, intractable, without status migrainosus: Secondary | ICD-10-CM | POA: Diagnosis not present

## 2016-05-18 DIAGNOSIS — G43019 Migraine without aura, intractable, without status migrainosus: Secondary | ICD-10-CM | POA: Diagnosis not present

## 2016-06-02 DIAGNOSIS — R634 Abnormal weight loss: Secondary | ICD-10-CM | POA: Diagnosis not present

## 2016-06-04 DIAGNOSIS — K210 Gastro-esophageal reflux disease with esophagitis: Secondary | ICD-10-CM | POA: Diagnosis not present

## 2016-06-17 DIAGNOSIS — N23 Unspecified renal colic: Secondary | ICD-10-CM | POA: Diagnosis not present

## 2016-06-17 DIAGNOSIS — N3021 Other chronic cystitis with hematuria: Secondary | ICD-10-CM | POA: Diagnosis not present

## 2016-06-17 DIAGNOSIS — R3121 Asymptomatic microscopic hematuria: Secondary | ICD-10-CM | POA: Diagnosis not present

## 2016-06-17 DIAGNOSIS — N1330 Unspecified hydronephrosis: Secondary | ICD-10-CM | POA: Diagnosis not present

## 2016-06-17 DIAGNOSIS — N200 Calculus of kidney: Secondary | ICD-10-CM | POA: Diagnosis not present

## 2016-07-08 ENCOUNTER — Other Ambulatory Visit: Payer: Self-pay

## 2016-08-04 DIAGNOSIS — J302 Other seasonal allergic rhinitis: Secondary | ICD-10-CM | POA: Diagnosis not present

## 2016-08-04 DIAGNOSIS — R634 Abnormal weight loss: Secondary | ICD-10-CM | POA: Diagnosis not present

## 2016-08-13 DIAGNOSIS — R197 Diarrhea, unspecified: Secondary | ICD-10-CM | POA: Diagnosis not present

## 2016-08-13 DIAGNOSIS — R1013 Epigastric pain: Secondary | ICD-10-CM | POA: Diagnosis not present

## 2016-08-18 ENCOUNTER — Other Ambulatory Visit: Payer: Self-pay | Admitting: Obstetrics and Gynecology

## 2016-08-18 DIAGNOSIS — Z1231 Encounter for screening mammogram for malignant neoplasm of breast: Secondary | ICD-10-CM

## 2016-08-18 DIAGNOSIS — Z9882 Breast implant status: Secondary | ICD-10-CM

## 2016-09-06 DIAGNOSIS — Z1211 Encounter for screening for malignant neoplasm of colon: Secondary | ICD-10-CM | POA: Diagnosis not present

## 2016-09-06 DIAGNOSIS — Z86010 Personal history of colonic polyps: Secondary | ICD-10-CM | POA: Diagnosis not present

## 2016-09-14 DIAGNOSIS — Z23 Encounter for immunization: Secondary | ICD-10-CM | POA: Diagnosis not present

## 2016-09-20 ENCOUNTER — Ambulatory Visit
Admission: RE | Admit: 2016-09-20 | Discharge: 2016-09-20 | Disposition: A | Discharge status: Home / Self Care | Payer: Medicare Other | Source: Ambulatory Visit | Attending: Obstetrics and Gynecology | Admitting: Obstetrics and Gynecology

## 2016-09-20 DIAGNOSIS — Z1231 Encounter for screening mammogram for malignant neoplasm of breast: Secondary | ICD-10-CM

## 2016-09-20 DIAGNOSIS — Z9882 Breast implant status: Secondary | ICD-10-CM

## 2016-10-11 DIAGNOSIS — K5730 Diverticulosis of large intestine without perforation or abscess without bleeding: Secondary | ICD-10-CM | POA: Diagnosis not present

## 2016-10-11 DIAGNOSIS — Z86010 Personal history of colonic polyps: Secondary | ICD-10-CM | POA: Diagnosis not present

## 2016-10-11 DIAGNOSIS — G43909 Migraine, unspecified, not intractable, without status migrainosus: Secondary | ICD-10-CM | POA: Diagnosis not present

## 2016-10-11 DIAGNOSIS — K648 Other hemorrhoids: Secondary | ICD-10-CM | POA: Diagnosis not present

## 2016-10-11 DIAGNOSIS — K580 Irritable bowel syndrome with diarrhea: Secondary | ICD-10-CM | POA: Diagnosis not present

## 2016-10-11 DIAGNOSIS — F419 Anxiety disorder, unspecified: Secondary | ICD-10-CM | POA: Diagnosis not present

## 2016-10-11 DIAGNOSIS — Z1211 Encounter for screening for malignant neoplasm of colon: Secondary | ICD-10-CM | POA: Diagnosis not present

## 2016-10-11 DIAGNOSIS — Z79899 Other long term (current) drug therapy: Secondary | ICD-10-CM | POA: Diagnosis not present

## 2016-10-11 DIAGNOSIS — F329 Major depressive disorder, single episode, unspecified: Secondary | ICD-10-CM | POA: Diagnosis not present

## 2016-10-11 HISTORY — PX: COLONOSCOPY: SHX174

## 2016-10-22 DIAGNOSIS — N3020 Other chronic cystitis without hematuria: Secondary | ICD-10-CM | POA: Diagnosis not present

## 2016-10-22 DIAGNOSIS — N201 Calculus of ureter: Secondary | ICD-10-CM | POA: Diagnosis not present

## 2016-10-23 DIAGNOSIS — N200 Calculus of kidney: Secondary | ICD-10-CM | POA: Diagnosis not present

## 2016-10-23 DIAGNOSIS — R112 Nausea with vomiting, unspecified: Secondary | ICD-10-CM | POA: Diagnosis not present

## 2016-10-23 DIAGNOSIS — R109 Unspecified abdominal pain: Secondary | ICD-10-CM | POA: Diagnosis not present

## 2016-11-05 DIAGNOSIS — Z87891 Personal history of nicotine dependence: Secondary | ICD-10-CM | POA: Diagnosis not present

## 2016-11-05 DIAGNOSIS — N200 Calculus of kidney: Secondary | ICD-10-CM | POA: Diagnosis not present

## 2016-11-05 DIAGNOSIS — E7800 Pure hypercholesterolemia, unspecified: Secondary | ICD-10-CM | POA: Diagnosis not present

## 2016-11-05 DIAGNOSIS — K219 Gastro-esophageal reflux disease without esophagitis: Secondary | ICD-10-CM | POA: Diagnosis not present

## 2016-11-05 DIAGNOSIS — Z8669 Personal history of other diseases of the nervous system and sense organs: Secondary | ICD-10-CM | POA: Diagnosis not present

## 2016-11-05 DIAGNOSIS — Z1159 Encounter for screening for other viral diseases: Secondary | ICD-10-CM | POA: Diagnosis not present

## 2016-11-05 DIAGNOSIS — Z1389 Encounter for screening for other disorder: Secondary | ICD-10-CM | POA: Diagnosis not present

## 2016-11-05 DIAGNOSIS — Z6823 Body mass index (BMI) 23.0-23.9, adult: Secondary | ICD-10-CM | POA: Diagnosis not present

## 2016-11-05 DIAGNOSIS — Z9189 Other specified personal risk factors, not elsewhere classified: Secondary | ICD-10-CM | POA: Diagnosis not present

## 2016-11-18 DIAGNOSIS — G43719 Chronic migraine without aura, intractable, without status migrainosus: Secondary | ICD-10-CM | POA: Diagnosis not present

## 2016-11-18 DIAGNOSIS — G43019 Migraine without aura, intractable, without status migrainosus: Secondary | ICD-10-CM | POA: Diagnosis not present

## 2016-12-14 DIAGNOSIS — I87393 Chronic venous hypertension (idiopathic) with other complications of bilateral lower extremity: Secondary | ICD-10-CM | POA: Diagnosis not present

## 2016-12-23 DIAGNOSIS — R002 Palpitations: Secondary | ICD-10-CM | POA: Diagnosis not present

## 2016-12-31 DIAGNOSIS — R002 Palpitations: Secondary | ICD-10-CM | POA: Diagnosis not present

## 2017-01-03 DIAGNOSIS — R002 Palpitations: Secondary | ICD-10-CM | POA: Diagnosis not present

## 2017-01-25 DIAGNOSIS — R8290 Unspecified abnormal findings in urine: Secondary | ICD-10-CM | POA: Diagnosis not present

## 2017-03-18 DIAGNOSIS — R0609 Other forms of dyspnea: Secondary | ICD-10-CM | POA: Diagnosis not present

## 2017-03-21 DIAGNOSIS — I2510 Atherosclerotic heart disease of native coronary artery without angina pectoris: Secondary | ICD-10-CM | POA: Diagnosis not present

## 2017-03-23 DIAGNOSIS — I10 Essential (primary) hypertension: Secondary | ICD-10-CM | POA: Insufficient documentation

## 2017-03-23 DIAGNOSIS — E785 Hyperlipidemia, unspecified: Secondary | ICD-10-CM | POA: Insufficient documentation

## 2017-03-23 DIAGNOSIS — R002 Palpitations: Secondary | ICD-10-CM | POA: Diagnosis not present

## 2017-03-24 DIAGNOSIS — R0609 Other forms of dyspnea: Secondary | ICD-10-CM | POA: Diagnosis not present

## 2017-03-29 DIAGNOSIS — R0609 Other forms of dyspnea: Secondary | ICD-10-CM | POA: Diagnosis not present

## 2017-03-29 DIAGNOSIS — R0602 Shortness of breath: Secondary | ICD-10-CM | POA: Diagnosis not present

## 2017-03-29 DIAGNOSIS — R0600 Dyspnea, unspecified: Secondary | ICD-10-CM | POA: Diagnosis not present

## 2017-04-18 DIAGNOSIS — R002 Palpitations: Secondary | ICD-10-CM | POA: Diagnosis not present

## 2017-05-02 DIAGNOSIS — R002 Palpitations: Secondary | ICD-10-CM | POA: Diagnosis not present

## 2017-05-02 DIAGNOSIS — R0600 Dyspnea, unspecified: Secondary | ICD-10-CM | POA: Diagnosis not present

## 2017-05-06 DIAGNOSIS — M791 Myalgia: Secondary | ICD-10-CM | POA: Diagnosis not present

## 2017-05-30 DIAGNOSIS — G43719 Chronic migraine without aura, intractable, without status migrainosus: Secondary | ICD-10-CM | POA: Diagnosis not present

## 2017-05-30 DIAGNOSIS — G43019 Migraine without aura, intractable, without status migrainosus: Secondary | ICD-10-CM | POA: Diagnosis not present

## 2017-05-31 DIAGNOSIS — H2511 Age-related nuclear cataract, right eye: Secondary | ICD-10-CM | POA: Diagnosis not present

## 2017-06-08 DIAGNOSIS — H25811 Combined forms of age-related cataract, right eye: Secondary | ICD-10-CM | POA: Diagnosis not present

## 2017-06-08 DIAGNOSIS — Z01818 Encounter for other preprocedural examination: Secondary | ICD-10-CM | POA: Diagnosis not present

## 2017-06-08 DIAGNOSIS — H2511 Age-related nuclear cataract, right eye: Secondary | ICD-10-CM | POA: Diagnosis not present

## 2017-06-15 DIAGNOSIS — H2512 Age-related nuclear cataract, left eye: Secondary | ICD-10-CM | POA: Diagnosis not present

## 2017-06-15 DIAGNOSIS — H25812 Combined forms of age-related cataract, left eye: Secondary | ICD-10-CM | POA: Diagnosis not present

## 2017-06-28 DIAGNOSIS — K210 Gastro-esophageal reflux disease with esophagitis: Secondary | ICD-10-CM | POA: Diagnosis not present

## 2017-06-28 DIAGNOSIS — R945 Abnormal results of liver function studies: Secondary | ICD-10-CM | POA: Diagnosis not present

## 2017-06-29 DIAGNOSIS — R945 Abnormal results of liver function studies: Secondary | ICD-10-CM | POA: Diagnosis not present

## 2017-07-06 DIAGNOSIS — R945 Abnormal results of liver function studies: Secondary | ICD-10-CM | POA: Diagnosis not present

## 2017-08-03 DIAGNOSIS — I491 Atrial premature depolarization: Secondary | ICD-10-CM | POA: Diagnosis not present

## 2017-08-22 ENCOUNTER — Other Ambulatory Visit: Payer: Self-pay | Admitting: Obstetrics and Gynecology

## 2017-08-22 DIAGNOSIS — Z1231 Encounter for screening mammogram for malignant neoplasm of breast: Secondary | ICD-10-CM

## 2017-08-25 DIAGNOSIS — Z23 Encounter for immunization: Secondary | ICD-10-CM | POA: Diagnosis not present

## 2017-09-08 IMAGING — MR MR THORACIC SPINE W/O CM
4 of 6 series · 19 of 48 positions shown · non-contrast
Comparison: Thoracic spine radiographs 09/29/2015. Chest CT
04/16/2015.

CLINICAL DATA: 69-year-old female with chronic thoracic back and
flank pain increasing recently. No known injury. Degenerative disc
disease. Subsequent encounter.

EXAM:
MRI THORACIC SPINE WITHOUT CONTRAST
TECHNIQUE: Multiplanar, multisequence MR imaging of the thoracic spine was
performed. No intravenous contrast was administered.

[Series 4: T2 · sagittal · 4.0mm · 0.42mm/px · 5 of 12 slices shown (1 of 2)]
[im 1/12]
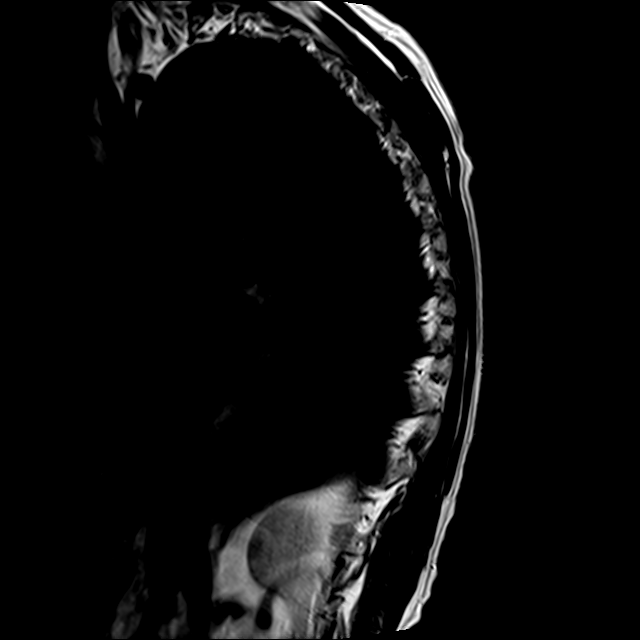
[im 3/12]
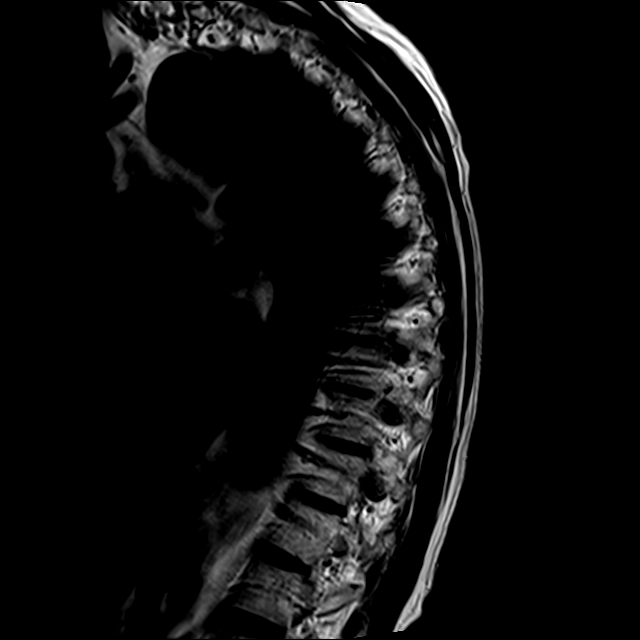
[im 6/12]
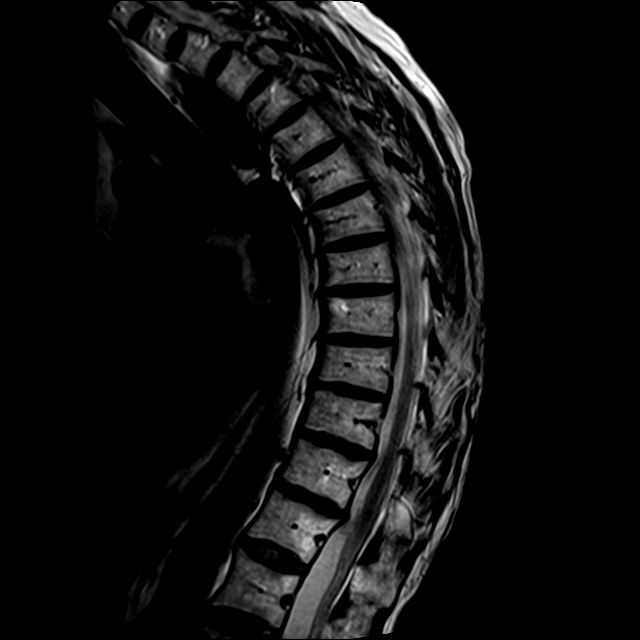
[im 9/12]
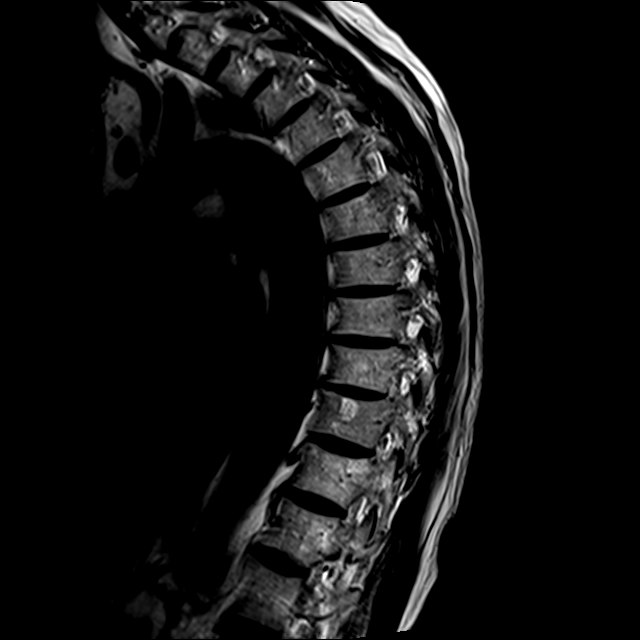
[im 12/12]
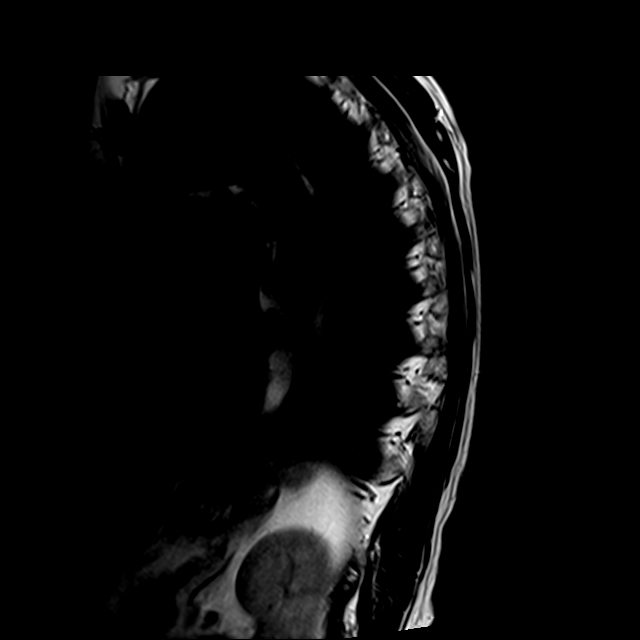

[Series 5: T1 · sagittal · 4.0mm · 0.42mm/px · 3 of 12 slices shown]
[im 1/12]
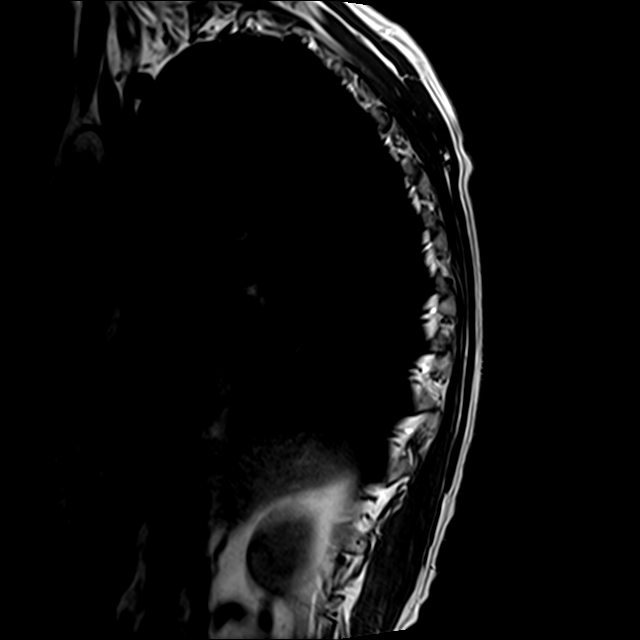
[im 6/12]
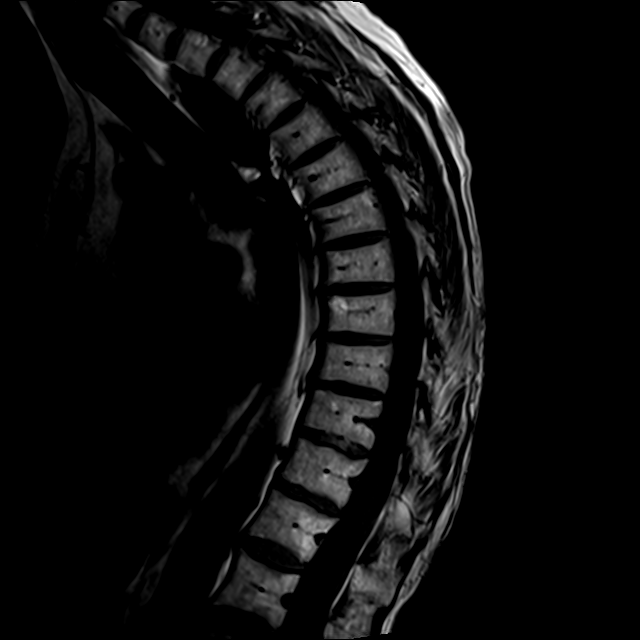
[im 12/12]
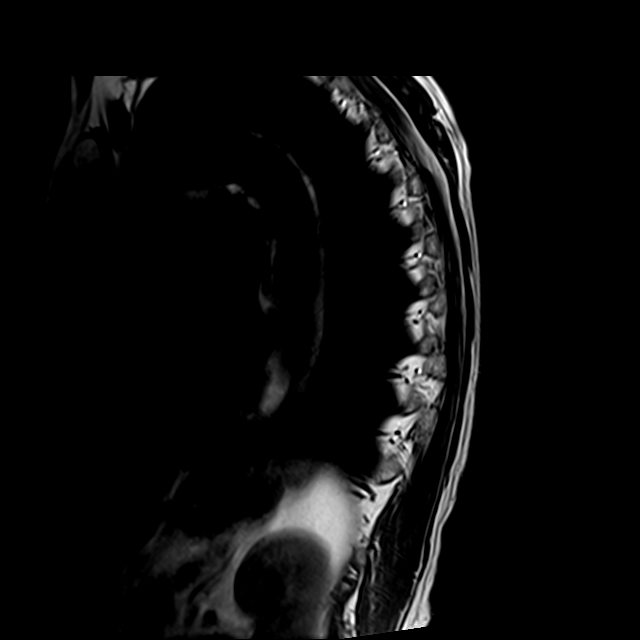

[Series 6: STIR · sagittal · 4.0mm · 0.53mm/px · 3 of 12 slices shown]
[im 1/12]
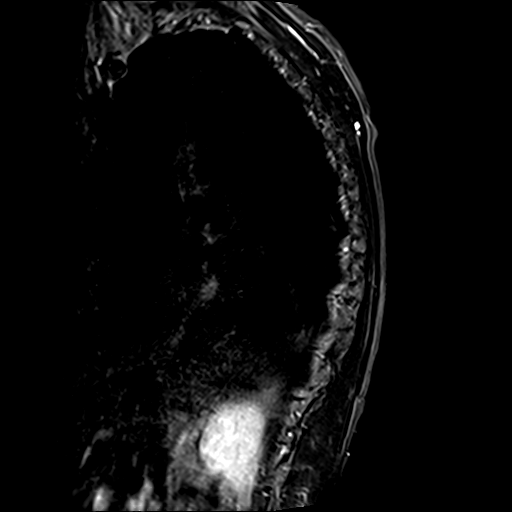
[im 6/12]
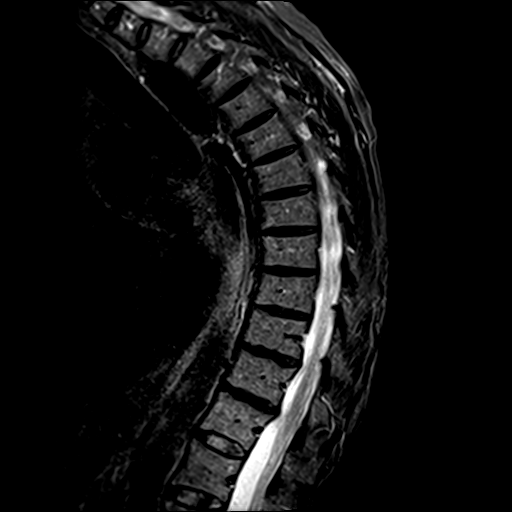
[im 12/12]
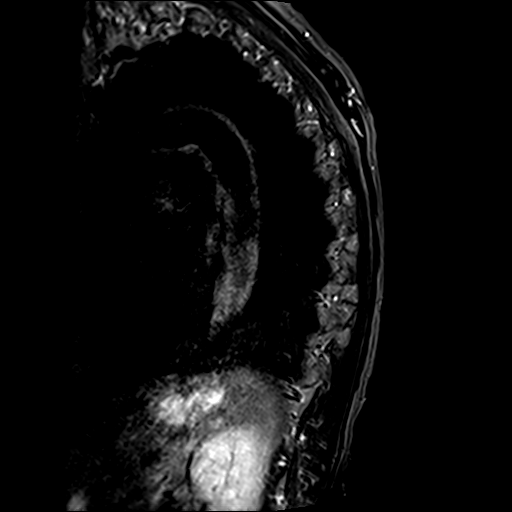

[Series 7: T2 · axial · 3.0mm · 0.42mm/px · z∈[-227,-87]mm · 8 of 36 slices shown (2 of 2)]
[im 1/36]
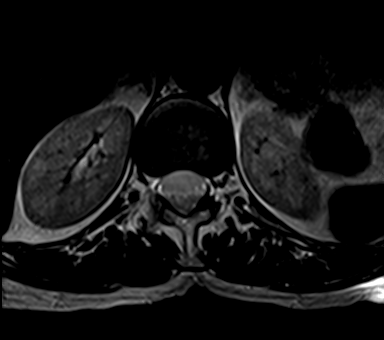
[im 6/36]
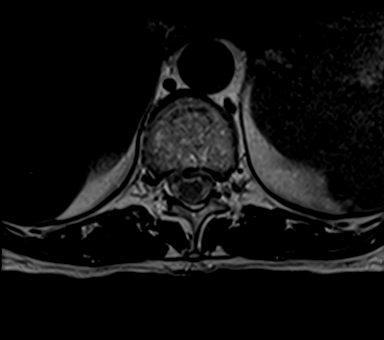
[im 11/36]
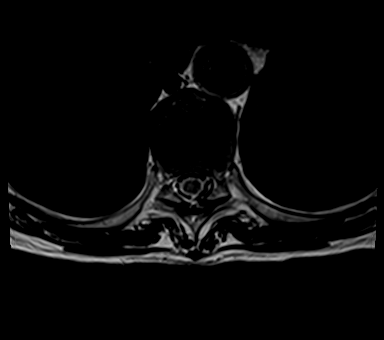
[im 16/36]
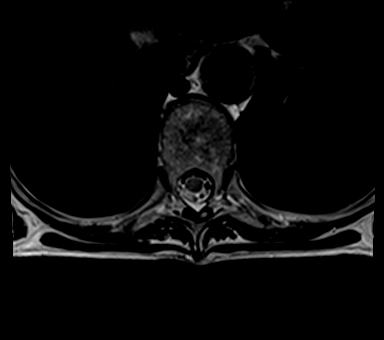
[im 18/36]
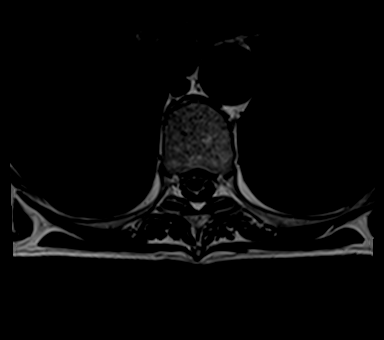
[im 21/36]
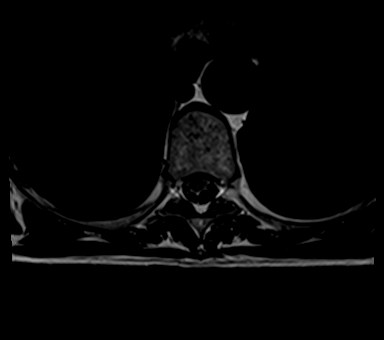
[im 26/36]
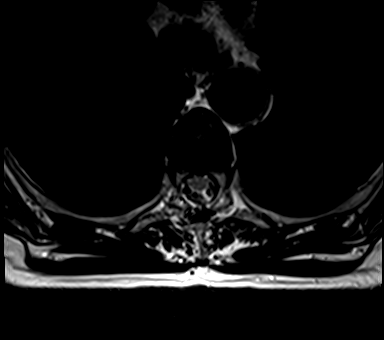
[im 31/36]
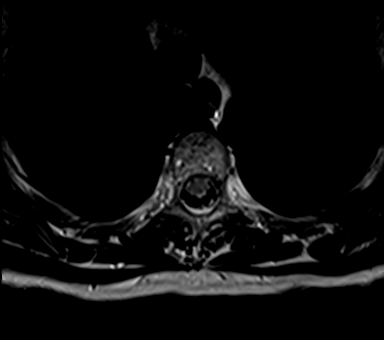

[19 of 48 positions shown; findings below may reference images not displayed]

FINDINGS: Chronic mildly to moderately exaggerated thoracic kyphosis with
stable thoracic vertebral height and alignment since the 2342 CT.
Occasional benign vertebral body hemangiomas are incidentally noted.
No marrow edema or evidence of acute osseous abnormality.

Stable and negative visualized thoracic and upper abdominal viscera.
Negative visualized posterior paraspinal soft tissues.

The following superimposed degenerative changes are noted:

T1-T2: Negative.

T2-T3: Negative.

T3-T4: Subtle central disc protrusion, no stenosis.

T4-T5: Subtle central disc protrusion. Mild facet hypertrophy. No
stenosis.

T5-T6: Small central disc protrusion (series 7, image 14) no
stenosis.

T6-T7: Negative.

T7-T8: Negative.

T8-T9: Negative.

T9-T10: Mild facet hypertrophy.  No stenosis.

T10-T11: Mild facet hypertrophy.  No stenosis.

T11-T12: Subtle central disc protrusion.  No stenosis.

T12-L1:  Negative.

Capacious thoracic spinal canal. Spinal cord signal is within normal
limits at all visualized levels. Normal conus medullaris at T12-L1.
IMPRESSION: 1. No acute osseous abnormality in the thoracic spine. Mild thoracic
facet hypertrophy.
2. Normal thoracic spinal cord. Capacious thoracic spinal canal with
no spinal stenosis or neural impingement. Multilevel minimal to mild
thoracic disc herniations.

## 2017-09-22 ENCOUNTER — Ambulatory Visit
Admission: RE | Admit: 2017-09-22 | Discharge: 2017-09-22 | Disposition: A | Discharge status: Home / Self Care | Payer: Medicare Other | Source: Ambulatory Visit | Attending: Obstetrics and Gynecology | Admitting: Obstetrics and Gynecology

## 2017-09-22 DIAGNOSIS — Z1231 Encounter for screening mammogram for malignant neoplasm of breast: Secondary | ICD-10-CM | POA: Diagnosis not present

## 2017-10-04 DIAGNOSIS — G43019 Migraine without aura, intractable, without status migrainosus: Secondary | ICD-10-CM | POA: Diagnosis not present

## 2017-10-04 DIAGNOSIS — G43719 Chronic migraine without aura, intractable, without status migrainosus: Secondary | ICD-10-CM | POA: Diagnosis not present

## 2017-11-09 DIAGNOSIS — E7800 Pure hypercholesterolemia, unspecified: Secondary | ICD-10-CM | POA: Diagnosis not present

## 2017-11-09 DIAGNOSIS — Z1389 Encounter for screening for other disorder: Secondary | ICD-10-CM | POA: Diagnosis not present

## 2017-11-09 DIAGNOSIS — R002 Palpitations: Secondary | ICD-10-CM | POA: Diagnosis not present

## 2017-11-09 DIAGNOSIS — K219 Gastro-esophageal reflux disease without esophagitis: Secondary | ICD-10-CM | POA: Diagnosis not present

## 2017-11-09 DIAGNOSIS — G43009 Migraine without aura, not intractable, without status migrainosus: Secondary | ICD-10-CM | POA: Diagnosis not present

## 2017-11-09 DIAGNOSIS — Z6825 Body mass index (BMI) 25.0-25.9, adult: Secondary | ICD-10-CM | POA: Diagnosis not present

## 2017-11-16 DIAGNOSIS — G43719 Chronic migraine without aura, intractable, without status migrainosus: Secondary | ICD-10-CM | POA: Diagnosis not present

## 2017-11-16 DIAGNOSIS — G43019 Migraine without aura, intractable, without status migrainosus: Secondary | ICD-10-CM | POA: Diagnosis not present

## 2017-12-23 DIAGNOSIS — J209 Acute bronchitis, unspecified: Secondary | ICD-10-CM | POA: Diagnosis not present

## 2017-12-23 DIAGNOSIS — J302 Other seasonal allergic rhinitis: Secondary | ICD-10-CM | POA: Diagnosis not present

## 2017-12-30 DIAGNOSIS — K210 Gastro-esophageal reflux disease with esophagitis: Secondary | ICD-10-CM | POA: Diagnosis not present

## 2017-12-30 DIAGNOSIS — R945 Abnormal results of liver function studies: Secondary | ICD-10-CM | POA: Diagnosis not present

## 2017-12-30 DIAGNOSIS — K760 Fatty (change of) liver, not elsewhere classified: Secondary | ICD-10-CM | POA: Diagnosis not present

## 2018-01-06 DIAGNOSIS — J209 Acute bronchitis, unspecified: Secondary | ICD-10-CM | POA: Diagnosis not present

## 2018-01-06 DIAGNOSIS — R05 Cough: Secondary | ICD-10-CM | POA: Diagnosis not present

## 2018-01-06 DIAGNOSIS — J302 Other seasonal allergic rhinitis: Secondary | ICD-10-CM | POA: Diagnosis not present

## 2018-01-12 DIAGNOSIS — B370 Candidal stomatitis: Secondary | ICD-10-CM | POA: Diagnosis not present

## 2018-02-15 DIAGNOSIS — G43719 Chronic migraine without aura, intractable, without status migrainosus: Secondary | ICD-10-CM | POA: Diagnosis not present

## 2018-02-15 DIAGNOSIS — G43019 Migraine without aura, intractable, without status migrainosus: Secondary | ICD-10-CM | POA: Diagnosis not present

## 2018-03-01 DIAGNOSIS — J302 Other seasonal allergic rhinitis: Secondary | ICD-10-CM | POA: Diagnosis not present

## 2018-03-01 DIAGNOSIS — J209 Acute bronchitis, unspecified: Secondary | ICD-10-CM | POA: Diagnosis not present

## 2018-03-22 DIAGNOSIS — Z01419 Encounter for gynecological examination (general) (routine) without abnormal findings: Secondary | ICD-10-CM | POA: Diagnosis not present

## 2018-04-04 DIAGNOSIS — G43019 Migraine without aura, intractable, without status migrainosus: Secondary | ICD-10-CM | POA: Diagnosis not present

## 2018-04-04 DIAGNOSIS — G43719 Chronic migraine without aura, intractable, without status migrainosus: Secondary | ICD-10-CM | POA: Diagnosis not present

## 2018-04-05 DIAGNOSIS — B001 Herpesviral vesicular dermatitis: Secondary | ICD-10-CM | POA: Diagnosis not present

## 2018-05-15 DIAGNOSIS — S92512A Displaced fracture of proximal phalanx of left lesser toe(s), initial encounter for closed fracture: Secondary | ICD-10-CM | POA: Diagnosis not present

## 2018-05-15 DIAGNOSIS — M79672 Pain in left foot: Secondary | ICD-10-CM | POA: Diagnosis not present

## 2018-05-15 DIAGNOSIS — E7800 Pure hypercholesterolemia, unspecified: Secondary | ICD-10-CM | POA: Diagnosis not present

## 2018-05-17 DIAGNOSIS — G43019 Migraine without aura, intractable, without status migrainosus: Secondary | ICD-10-CM | POA: Diagnosis not present

## 2018-05-17 DIAGNOSIS — G43719 Chronic migraine without aura, intractable, without status migrainosus: Secondary | ICD-10-CM | POA: Diagnosis not present

## 2018-08-03 DIAGNOSIS — Z23 Encounter for immunization: Secondary | ICD-10-CM | POA: Diagnosis not present

## 2018-08-11 DIAGNOSIS — M25511 Pain in right shoulder: Secondary | ICD-10-CM | POA: Diagnosis not present

## 2018-08-11 DIAGNOSIS — M7541 Impingement syndrome of right shoulder: Secondary | ICD-10-CM | POA: Diagnosis not present

## 2018-08-16 DIAGNOSIS — M7541 Impingement syndrome of right shoulder: Secondary | ICD-10-CM | POA: Diagnosis not present

## 2018-08-16 DIAGNOSIS — R002 Palpitations: Secondary | ICD-10-CM | POA: Diagnosis not present

## 2018-08-16 DIAGNOSIS — E7800 Pure hypercholesterolemia, unspecified: Secondary | ICD-10-CM | POA: Diagnosis not present

## 2018-08-16 DIAGNOSIS — M25511 Pain in right shoulder: Secondary | ICD-10-CM | POA: Diagnosis not present

## 2018-08-17 ENCOUNTER — Other Ambulatory Visit: Payer: Self-pay | Admitting: Obstetrics and Gynecology

## 2018-08-17 DIAGNOSIS — Z1231 Encounter for screening mammogram for malignant neoplasm of breast: Secondary | ICD-10-CM

## 2018-08-23 DIAGNOSIS — M25511 Pain in right shoulder: Secondary | ICD-10-CM | POA: Diagnosis not present

## 2018-08-23 DIAGNOSIS — M7541 Impingement syndrome of right shoulder: Secondary | ICD-10-CM | POA: Diagnosis not present

## 2018-09-04 ENCOUNTER — Other Ambulatory Visit: Payer: Self-pay | Admitting: *Deleted

## 2018-09-04 DIAGNOSIS — R002 Palpitations: Secondary | ICD-10-CM

## 2018-09-04 DIAGNOSIS — G43719 Chronic migraine without aura, intractable, without status migrainosus: Secondary | ICD-10-CM | POA: Diagnosis not present

## 2018-09-04 DIAGNOSIS — G43019 Migraine without aura, intractable, without status migrainosus: Secondary | ICD-10-CM | POA: Diagnosis not present

## 2018-09-15 ENCOUNTER — Ambulatory Visit (INDEPENDENT_AMBULATORY_CARE_PROVIDER_SITE_OTHER): Payer: Medicare Other

## 2018-09-15 DIAGNOSIS — R002 Palpitations: Secondary | ICD-10-CM | POA: Diagnosis not present

## 2018-09-25 ENCOUNTER — Telehealth: Payer: Self-pay | Admitting: *Deleted

## 2018-09-25 ENCOUNTER — Other Ambulatory Visit: Payer: Self-pay

## 2018-09-25 NOTE — Telephone Encounter (Signed)
Faxed monitor report and results to Dr. Doran Durand office.

## 2018-09-26 ENCOUNTER — Ambulatory Visit: Payer: Medicare Other

## 2018-09-28 ENCOUNTER — Ambulatory Visit: Payer: Medicare Other

## 2018-10-25 ENCOUNTER — Encounter: Payer: Self-pay | Admitting: Gastroenterology

## 2018-10-26 ENCOUNTER — Ambulatory Visit (INDEPENDENT_AMBULATORY_CARE_PROVIDER_SITE_OTHER): Payer: Medicare Other | Admitting: Gastroenterology

## 2018-10-26 ENCOUNTER — Other Ambulatory Visit (INDEPENDENT_AMBULATORY_CARE_PROVIDER_SITE_OTHER): Payer: Medicare Other

## 2018-10-26 VITALS — BP 126/76 | HR 64 | Ht 61.5 in | Wt 137.4 lb

## 2018-10-26 DIAGNOSIS — K219 Gastro-esophageal reflux disease without esophagitis: Secondary | ICD-10-CM

## 2018-10-26 DIAGNOSIS — K5909 Other constipation: Secondary | ICD-10-CM

## 2018-10-26 NOTE — Progress Notes (Signed)
Chief Complaint: FU  Referring Provider:  Nona Dell, Corene Cornea, MD      ASSESSMENT AND PLAN;   #1. Generalized abdominal pain with LLQ tenderness. Neg colon 10/2016 except for mild sigmoid diverticulosis.  Negative EGD 02/2016 except for small hiatal hernia, incidental gastric polyps, mild gastritis.  Negative small bowel biopsies for celiac. #2. IBS with predominant constipation (H/O diarrhea in the past) #3. GERD. #4. Family history of metastatic pancreatic cancer (sister).  Plan: - CBC, CMP and lipase. - CT abdo/pelvis with PO and IV contrast - Colace 1 tab po qd. - Dexilant 60mg  po qd to continue. - FU in 8 weeks. If still problems, will consider endoscopic evaluation.   HPI:    Rebecca [name] is a 72 y.o. female  For follow-up visit Has been having abdominal pain-left lower quadrant, right lower quadrant and right upper quadrant With associated abdominal bloating Constipation-having bowel movements at the frequency of 1 in 4 to 5 days.  Has increasing abdominal pain when she gets Dulcolax. No melena or hematochezia No weight loss.  Additional GI history: -Sister had metastatic pancreatic cancer -History of abnormal liver function tests due to fatty liver diagnosed 2002, had liver biopsy 12/2004.  Negative autoimmune hepatitis profile, acute hepatitis profile, ANA Past Medical History:  Diagnosis Date  . Anxiety disorder   . Blood transfusion    Blood bank notified Ashley Akin)  . Blood transfusion without reported diagnosis 1984 x7   [facility]  . Complication of anesthesia 2006   Drop in  Bp intra-op with Lap Chole. [employer].  . Depression   . GERD (gastroesophageal reflux disease)   . Headache(784.0)    migraines  . History of colon polyps   . IBS (irritable bowel syndrome)   . Internal hemorrhoids without complication     Past Surgical History:  Procedure Laterality Date  . ABDOMINAL HYSTERECTOMY  1984  . APPENDECTOMY  1967  . BLADDER SUSPENSION   06/23/2011   Procedure: TRANSVAGINAL TAPE (TVT) PROCEDURE;  Surgeon: Daria Pastures;  Location: [facility];  Service: Gynecology;  Laterality: N/A;  . BREAST SURGERY     implants due to removal of breast tissue  . CATARACT EXTRACTION     Inserted prosthetic lens  . CHOLECYSTECTOMY     due to biliary dyskinesia  . COLONOSCOPY  10/11/2016   Minimal sigmoid diverticulosis. Otherwise normal colonsocopy.   . CYSTOCELE REPAIR  06/23/2011   Procedure: ANTERIOR REPAIR (CYSTOCELE);  Surgeon: Daria Pastures;  Location: [facility];  Service: Gynecology;  Laterality: N/A;  . CYSTOSCOPY  06/23/2011   Procedure: CYSTOSCOPY;  Surgeon: Daria Pastures;  Location: [facility];  Service: Gynecology;  Laterality: N/A;  . DNS s/p nasal surgery    . ESOPHAGOGASTRODUODENOSCOPY  03/03/2016   Small hiatal hernia. Incidental gastric polyps. Mild gastritis.   . EYE MUSCLE SURGERY  as child  . HERNIA REPAIR  12/30/2010   inguinal. performed by Dr Noberto Retort   . KNEE ARTHROPLASTY  2006  . OVARIAN CYST REMOVAL     bilateral  . RECTOCELE REPAIR  11/10/2011   Procedure: POSTERIOR REPAIR (RECTOCELE);  Surgeon: Daria Pastures, MD;  Location: [facility];  Service: Gynecology;  Laterality: N/A;  . RECTOCELE REPAIR N/A 07/03/2014   Procedure: revision of POSTERIOR REPAIR (RECTOCELE) with bladder instillation ;  Surgeon: Daria Pastures, MD;  Location: [facility];  Service: Gynecology;  Laterality: N/A;  . RECTOCELE REPAIR N/A 07/03/2014   Procedure: POSTERIOR REPAIR (RECTOCELE) with suture  repair for hemostasis ;  Surgeon: Daria Pastures, MD;  Location: [facility];  Service: Gynecology;  Laterality: N/A;  . ROTATOR CUFF REPAIR Left     Family History  Problem Relation Age of Onset  . Breast cancer Mother   . Pancreatic cancer Sister        metastatic  . Colon cancer Neg Hx   . Esophageal cancer Neg Hx     Social History   Tobacco Use  . Smoking status: Former Research scientist (life sciences)  . Smokeless tobacco: Never Used  Substance Use  Topics  . Alcohol use: No  . Drug use: No    Current Outpatient Medications  Medication Sig Dispense Refill  . b complex vitamins tablet Take 1 tablet by mouth daily.      . Cholecalciferol (VITAMIN D) 2000 UNITS tablet Take 2,000 Units by mouth daily.      . cyclobenzaprine (FLEXERIL) 10 MG tablet Take 10 mg by mouth 3 (three) times daily as needed for muscle spasms.    Marland Kitchen dexlansoprazole (DEXILANT) 60 MG capsule Take 60 mg by mouth daily.    Eduard Roux (AIMOVIG Ross) Inject into the skin every 30 (thirty) days.    Marland Kitchen esomeprazole (NEXIUM) 20 MG capsule Take 20 mg by mouth at bedtime.    . metoprolol tartrate (LOPRESSOR) 25 MG tablet Take 3 tablets by mouth 2 (two) times daily.    . pravastatin (PRAVACHOL) 40 MG tablet Take 40 mg by mouth at bedtime.    . Multiple Vitamin (MULTIVITAMIN WITH MINERALS) TABS tablet Take 1 tablet by mouth daily.     No current facility-administered medications for this visit.     Allergies  Allergen Reactions  . Ciprofloxacin   . Codeine Nausea Only    Review of Systems:  neg    Physical Exam:    BP 126/76   Pulse 64   Ht 5' 1.5" (1.562 m)   Wt 137 lb 6 oz (62.3 kg)   BMI 25.54 kg/m  Filed Weights   10/26/18 1551  Weight: 137 lb 6 oz (62.3 kg)   Constitutional:  Well-developed, in no acute distress. Psychiatric: Normal mood and affect. Behavior is normal. HEENT: Pupils normal.  Conjunctivae are normal. No scleral icterus. Neck supple.  Cardiovascular: Normal rate, regular rhythm. No edema Pulmonary/chest: Effort normal and breath sounds normal. No wheezing, rales or rhonchi. Abdominal: Soft, nondistended.  Mild lower abdominal tenderness. Bowel sounds active throughout. There are no masses palpable. No hepatomegaly. Rectal:  defered Neurological: Alert and oriented to person place and time. Skin: Skin is warm and dry. No rashes noted. 25 minutes spent with the patient today. Greater than 50% was spent in counseling and coordination  of care with the patient    Carmell Austria, MD 10/26/2018, 4:04 PM  Cc: Townsend Roger, MD

## 2018-10-26 NOTE — Patient Instructions (Signed)
If you are age 70 or older, your body mass index should be between 23-30. Your Body mass index is 25.54 kg/m. If this is out of the aforementioned range listed, please consider follow up with your Primary Care Provider.  If you are age 58 or younger, your body mass index should be between 19-25. Your Body mass index is 25.54 kg/m. If this is out of the aformentioned range listed, please consider follow up with your Primary Care Provider.   Please purchase the following medications over the counter and take as directed: Colace once daily.  Please go to the lab on the 2nd floor [address] before you leave the office today.   You have been scheduled for a CT scan of the abdomen and pelvis at [facility].  You are scheduled on 11/03/18 at 10:30am. You should arrive 15 minutes prior to your appointment time for registration. Please follow the written instructions below on the day of your exam:  WARNING: IF YOU ARE ALLERGIC TO IODINE/X-RAY DYE, PLEASE NOTIFY RADIOLOGY IMMEDIATELY AT [phone]! YOU WILL BE GIVEN A 13 HOUR PREMEDICATION PREP.  1) Do not eat or drink anything after 6:30am (4 hours prior to your test) 2) You have been given 2 bottles of oral contrast to drink. The solution may taste better if refrigerated, but do NOT add ice or any other liquid to this solution. Shake well before drinking.    Drink 1 bottle of contrast @ 8:30am (2 hours prior to your exam)  Drink 1 bottle of contrast @ 9:30am (1 hour prior to your exam)  You may take any medications as prescribed with a small amount of water, if necessary. If you take any of the following medications: METFORMIN, GLUCOPHAGE, GLUCOVANCE, AVANDAMET, RIOMET, FORTAMET, [place] MET, JANUMET, GLUMETZA or METAGLIP, you MAY be asked to HOLD this medication 48 hours AFTER the exam.  The purpose of you drinking the oral contrast is to aid in the visualization of your intestinal tract. The contrast solution may cause some diarrhea.  Depending on your individual set of symptoms, you may also receive an intravenous injection of x-ray contrast/dye. Plan on being at [facility] for 30 minutes or longer, depending on the type of exam you are having performed.  This test typically takes 30-45 minutes to complete.  If you have any questions regarding your exam or if you need to reschedule, you may call the CT department at [phone] between the hours of 8:00 am and 5:00 pm, Monday-Friday.  ________________________________________________________________________  Thank you,  Dr. Jackquline Denmark

## 2018-10-27 LAB — COMPREHENSIVE METABOLIC PANEL
ALT: 31 U/L (ref 0–35)
AST: 24 U/L (ref 0–37)
Albumin: 4.2 g/dL (ref 3.5–5.2)
Alkaline Phosphatase: 94 U/L (ref 39–117)
BUN: 22 mg/dL (ref 6–23)
CO2: 30 mEq/L (ref 19–32)
Calcium: 9.4 mg/dL (ref 8.4–10.5)
Chloride: 106 mEq/L (ref 96–112)
Creatinine, Ser: 0.82 mg/dL (ref 0.40–1.20)
GFR: 73.2 mL/min (ref >60.00)
Glucose, Bld: 91 mg/dL (ref 70–99)
Potassium: 4.2 mEq/L (ref 3.5–5.1)
SODIUM: 143 meq/L (ref 135–145)
Total Bilirubin: 0.3 mg/dL (ref 0.2–1.2)
Total Protein: 6.2 g/dL (ref 6.0–8.3)

## 2018-10-27 LAB — CBC WITH DIFFERENTIAL/PLATELET
Basophils Absolute: 0 10*3/uL (ref 0.0–0.1)
Basophils Relative: 1 % (ref 0.0–3.0)
EOS PCT: 2.2 % (ref 0.0–5.0)
Eosinophils Absolute: 0.1 10*3/uL (ref 0.0–0.7)
HCT: 38.5 % (ref 36.0–46.0)
Hemoglobin: 12.6 g/dL (ref 12.0–15.0)
Lymphocytes Relative: 30.4 % (ref 12.0–46.0)
Lymphs Abs: 1.3 10*3/uL (ref 0.7–4.0)
MCHC: 32.9 g/dL (ref 30.0–36.0)
MCV: 95 fl (ref 78.0–100.0)
MONO ABS: 0.5 10*3/uL (ref 0.1–1.0)
Monocytes Relative: 10.4 % (ref 3.0–12.0)
Neutro Abs: 2.5 10*3/uL (ref 1.4–7.7)
Neutrophils Relative %: 56 % (ref 43.0–77.0)
Platelets: 155 10*3/uL (ref 150.0–400.0)
RBC: 4.05 Mil/uL (ref 3.87–5.11)
RDW: 13.3 % (ref 11.5–15.5)
WBC: 4.4 10*3/uL (ref 4.0–10.5)

## 2018-10-27 LAB — LIPASE: Lipase: 34 U/L (ref 11.0–59.0)

## 2018-10-30 ENCOUNTER — Other Ambulatory Visit: Payer: Self-pay

## 2018-10-30 DIAGNOSIS — K219 Gastro-esophageal reflux disease without esophagitis: Secondary | ICD-10-CM

## 2018-10-30 DIAGNOSIS — R7989 Other specified abnormal findings of blood chemistry: Secondary | ICD-10-CM

## 2018-10-30 DIAGNOSIS — R10814 Left lower quadrant abdominal tenderness: Secondary | ICD-10-CM

## 2018-10-30 DIAGNOSIS — K5909 Other constipation: Secondary | ICD-10-CM

## 2018-10-30 DIAGNOSIS — K581 Irritable bowel syndrome with constipation: Secondary | ICD-10-CM

## 2018-10-30 DIAGNOSIS — R1084 Generalized abdominal pain: Secondary | ICD-10-CM

## 2018-10-30 DIAGNOSIS — D649 Anemia, unspecified: Secondary | ICD-10-CM

## 2018-10-30 DIAGNOSIS — R945 Abnormal results of liver function studies: Secondary | ICD-10-CM

## 2018-11-02 ENCOUNTER — Ambulatory Visit
Admission: RE | Admit: 2018-11-02 | Discharge: 2018-11-02 | Disposition: A | Discharge status: Home / Self Care | Payer: Medicare Other | Source: Ambulatory Visit | Attending: Obstetrics and Gynecology | Admitting: Obstetrics and Gynecology

## 2018-11-02 DIAGNOSIS — Z1231 Encounter for screening mammogram for malignant neoplasm of breast: Secondary | ICD-10-CM

## 2018-11-03 ENCOUNTER — Ambulatory Visit (HOSPITAL_BASED_OUTPATIENT_CLINIC_OR_DEPARTMENT_OTHER)
Admission: RE | Admit: 2018-11-03 | Discharge: 2018-11-03 | Disposition: A | Discharge status: Home / Self Care | Payer: Medicare Other | Source: Ambulatory Visit | Attending: Gastroenterology | Admitting: Gastroenterology

## 2018-11-03 ENCOUNTER — Other Ambulatory Visit (INDEPENDENT_AMBULATORY_CARE_PROVIDER_SITE_OTHER): Payer: Medicare Other

## 2018-11-03 DIAGNOSIS — K5909 Other constipation: Secondary | ICD-10-CM | POA: Insufficient documentation

## 2018-11-03 DIAGNOSIS — K219 Gastro-esophageal reflux disease without esophagitis: Secondary | ICD-10-CM

## 2018-11-03 DIAGNOSIS — R10814 Left lower quadrant abdominal tenderness: Secondary | ICD-10-CM

## 2018-11-03 DIAGNOSIS — D649 Anemia, unspecified: Secondary | ICD-10-CM | POA: Diagnosis not present

## 2018-11-03 DIAGNOSIS — R1084 Generalized abdominal pain: Secondary | ICD-10-CM

## 2018-11-03 DIAGNOSIS — K581 Irritable bowel syndrome with constipation: Secondary | ICD-10-CM

## 2018-11-03 DIAGNOSIS — R109 Unspecified abdominal pain: Secondary | ICD-10-CM | POA: Diagnosis not present

## 2018-11-03 LAB — CBC WITH DIFFERENTIAL/PLATELET
Basophils Absolute: 0 10*3/uL (ref 0.0–0.1)
Basophils Relative: 0.7 % (ref 0.0–3.0)
EOS ABS: 0.1 10*3/uL (ref 0.0–0.7)
Eosinophils Relative: 2.6 % (ref 0.0–5.0)
HCT: 38.7 % (ref 36.0–46.0)
Hemoglobin: 12.9 g/dL (ref 12.0–15.0)
Lymphocytes Relative: 30.4 % (ref 12.0–46.0)
Lymphs Abs: 1.3 10*3/uL (ref 0.7–4.0)
MCHC: 33.3 g/dL (ref 30.0–36.0)
MCV: 95.2 fl (ref 78.0–100.0)
Monocytes Absolute: 0.5 10*3/uL (ref 0.1–1.0)
Monocytes Relative: 11.8 % (ref 3.0–12.0)
NEUTROS ABS: 2.3 10*3/uL (ref 1.4–7.7)
Neutrophils Relative %: 54.5 % (ref 43.0–77.0)
Platelets: 171 10*3/uL (ref 150.0–400.0)
RBC: 4.06 Mil/uL (ref 3.87–5.11)
RDW: 12.7 % (ref 11.5–15.5)
WBC: 4.1 10*3/uL (ref 4.0–10.5)

## 2018-11-03 LAB — IBC PANEL
IRON: 100 ug/dL (ref 42–145)
Saturation Ratios: 30.1 % (ref 20.0–50.0)
Transferrin: 237 mg/dL (ref 212.0–360.0)

## 2018-11-03 LAB — FERRITIN: Ferritin: 56.2 ng/mL (ref 10.0–291.0)

## 2018-11-03 MED ORDER — IOPAMIDOL (ISOVUE-300) INJECTION 61%
100.0000 mL | Freq: Once | INTRAVENOUS | Status: AC | PRN
  Administered 2018-11-03: 100 mL via INTRAVENOUS

## 2018-11-10 ENCOUNTER — Telehealth: Payer: Self-pay | Admitting: Gastroenterology

## 2018-11-10 NOTE — Telephone Encounter (Signed)
Called and spoke with patient-patient informed of Dr. Lyndel Safe being out of the office at this time; however, as soon as Dr. Lyndel Safe has returned to the office and had time to review the results she would be notified of result and MD recommendations; Patient verbalized understanding of information/instructions; Patient was advised to call back if questions/concerns arise;

## 2018-11-10 NOTE — Telephone Encounter (Signed)
Pt called in with question about results from her CT scan would like a cll back

## 2018-11-13 DIAGNOSIS — R100 Acute abdomen: Secondary | ICD-10-CM | POA: Diagnosis not present

## 2018-11-13 DIAGNOSIS — N3020 Other chronic cystitis without hematuria: Secondary | ICD-10-CM | POA: Diagnosis not present

## 2018-11-16 ENCOUNTER — Other Ambulatory Visit: Payer: Medicare Other

## 2018-11-17 ENCOUNTER — Other Ambulatory Visit: Payer: Self-pay

## 2018-11-17 ENCOUNTER — Other Ambulatory Visit (INDEPENDENT_AMBULATORY_CARE_PROVIDER_SITE_OTHER): Payer: Medicare Other

## 2018-11-17 DIAGNOSIS — K5909 Other constipation: Secondary | ICD-10-CM

## 2018-11-17 DIAGNOSIS — E7800 Pure hypercholesterolemia, unspecified: Secondary | ICD-10-CM | POA: Diagnosis not present

## 2018-11-17 LAB — FECAL OCCULT BLOOD, IMMUNOCHEMICAL: Fecal Occult Bld: NEGATIVE

## 2018-12-01 DIAGNOSIS — R300 Dysuria: Secondary | ICD-10-CM | POA: Diagnosis not present

## 2018-12-01 DIAGNOSIS — R8290 Unspecified abnormal findings in urine: Secondary | ICD-10-CM | POA: Diagnosis not present

## 2018-12-19 DIAGNOSIS — M542 Cervicalgia: Secondary | ICD-10-CM | POA: Diagnosis not present

## 2018-12-19 DIAGNOSIS — G43719 Chronic migraine without aura, intractable, without status migrainosus: Secondary | ICD-10-CM | POA: Diagnosis not present

## 2018-12-19 DIAGNOSIS — M791 Myalgia, unspecified site: Secondary | ICD-10-CM | POA: Diagnosis not present

## 2018-12-19 DIAGNOSIS — G518 Other disorders of facial nerve: Secondary | ICD-10-CM | POA: Diagnosis not present

## 2018-12-19 DIAGNOSIS — G43019 Migraine without aura, intractable, without status migrainosus: Secondary | ICD-10-CM | POA: Diagnosis not present

## 2018-12-28 ENCOUNTER — Ambulatory Visit: Payer: Medicare Other | Admitting: Gastroenterology

## 2019-01-01 ENCOUNTER — Ambulatory Visit (INDEPENDENT_AMBULATORY_CARE_PROVIDER_SITE_OTHER): Payer: Medicare Other | Admitting: Gastroenterology

## 2019-01-01 ENCOUNTER — Encounter (INDEPENDENT_AMBULATORY_CARE_PROVIDER_SITE_OTHER): Payer: Self-pay

## 2019-01-01 ENCOUNTER — Encounter: Payer: Self-pay | Admitting: Gastroenterology

## 2019-01-01 VITALS — BP 126/70 | HR 59 | Ht 61.5 in | Wt 137.4 lb

## 2019-01-01 DIAGNOSIS — R1084 Generalized abdominal pain: Secondary | ICD-10-CM | POA: Diagnosis not present

## 2019-01-01 NOTE — Progress Notes (Signed)
Chief Complaint: FU  Referring Provider:  Nona Dell, Corene Cornea, MD      ASSESSMENT AND PLAN;   #1. Generalized abdominal pain with LLQ tenderness. Neg colon 10/2016 except for mild sigmoid diverticulosis.  Negative EGD 02/2016 except for small hiatal hernia, incidental gastric polyps, mild gastritis.  Negative small bowel biopsies for celiac. Neg CT 10/2018. Now better. Having back pain. #2. IBS with predominant constipation (H/O diarrhea in the past) #3. GERD. #4. Family history of metastatic pancreatic cancer (sister).  Plan: - Heating pads. - Colace 1 tab po qd MWF. - Dexilant 60mg  po qd to continue for now. - She will get in touch with Dr Nona Dell regarding back pain. - Copy of the CT scan was given to the patient. - FU in 6 months. If still problems, will consider endoscopic evaluation.   HPI:    Rebecca [name] is a 73 y.o. female  Goes to same church as Anderson Malta For follow-up visit  Does feel better Negative CT scan of the abdomen/pelvis February 2019. Had negative urology work-up  Denies having any further constipation.  She has been using Colace 3-4 times per week.  Denies having any upper abdominal pain.  No melena or hematochezia. No weight loss.  Wants to hold off on GI evaluation at the present time.  Has been complaining of back pain.  She also has scoliosis.  Additional GI history: -Sister had metastatic pancreatic cancer -History of abnormal liver function tests due to fatty liver diagnosed 2002, had liver biopsy 12/2004.  Negative autoimmune hepatitis profile, acute hepatitis profile, ANA Past Medical History:  Diagnosis Date  . Anxiety disorder   . Blood transfusion    Blood bank notified Ashley Akin)  . Blood transfusion without reported diagnosis 1984 x7   [facility]  . Complication of anesthesia 2006   Drop in  Bp intra-op with Lap Chole. [employer].  . Depression   . GERD (gastroesophageal reflux disease)   . Headache(784.0)    migraines  .  History of colon polyps   . IBS (irritable bowel syndrome)   . Internal hemorrhoids without complication     Past Surgical History:  Procedure Laterality Date  . ABDOMINAL HYSTERECTOMY  1984  . APPENDECTOMY  1967  . BLADDER SUSPENSION  06/23/2011   Procedure: TRANSVAGINAL TAPE (TVT) PROCEDURE;  Surgeon: Daria Pastures;  Location: [facility];  Service: Gynecology;  Laterality: N/A;  . BREAST SURGERY     implants due to removal of breast tissue  . CATARACT EXTRACTION     Inserted prosthetic lens  . CHOLECYSTECTOMY     due to biliary dyskinesia  . COLONOSCOPY  10/11/2016   Minimal sigmoid diverticulosis. Otherwise normal colonsocopy.   . CYSTOCELE REPAIR  06/23/2011   Procedure: ANTERIOR REPAIR (CYSTOCELE);  Surgeon: Daria Pastures;  Location: [facility];  Service: Gynecology;  Laterality: N/A;  . CYSTOSCOPY  06/23/2011   Procedure: CYSTOSCOPY;  Surgeon: Daria Pastures;  Location: [facility];  Service: Gynecology;  Laterality: N/A;  . DNS s/p nasal surgery    . ESOPHAGOGASTRODUODENOSCOPY  03/03/2016   Small hiatal hernia. Incidental gastric polyps. Mild gastritis.   . EYE MUSCLE SURGERY  as child  . HERNIA REPAIR  12/30/2010   inguinal. performed by Dr Noberto Retort   . KNEE ARTHROPLASTY  2006  . OVARIAN CYST REMOVAL     bilateral  . RECTOCELE REPAIR  11/10/2011   Procedure: POSTERIOR REPAIR (RECTOCELE);  Surgeon: Daria Pastures, MD;  Location: [facility];  Service: Gynecology;  Laterality: N/A;  . RECTOCELE REPAIR N/A 07/03/2014   Procedure: revision of POSTERIOR REPAIR (RECTOCELE) with bladder instillation ;  Surgeon: Daria Pastures, MD;  Location: [facility];  Service: Gynecology;  Laterality: N/A;  . RECTOCELE REPAIR N/A 07/03/2014   Procedure: POSTERIOR REPAIR (RECTOCELE) with suture repair for hemostasis ;  Surgeon: Daria Pastures, MD;  Location: [facility];  Service: Gynecology;  Laterality: N/A;  . ROTATOR CUFF REPAIR Left     Family History  Problem Relation Age of Onset  .  Breast cancer Mother   . Pancreatic cancer Sister        metastatic  . Colon cancer Neg Hx   . Esophageal cancer Neg Hx     Social History   Tobacco Use  . Smoking status: Former Research scientist (life sciences)  . Smokeless tobacco: Never Used  . Tobacco comment: quit 1984  Substance Use Topics  . Alcohol use: No  . Drug use: No    Current Outpatient Medications  Medication Sig Dispense Refill  . b complex vitamins tablet Take 1 tablet by mouth daily.      . Cholecalciferol (VITAMIN D) 2000 UNITS tablet Take 2,000 Units by mouth daily.      . cyclobenzaprine (FLEXERIL) 10 MG tablet Take 10 mg by mouth 3 (three) times daily as needed for muscle spasms.    Marland Kitchen dexlansoprazole (DEXILANT) 60 MG capsule Take 60 mg by mouth daily.    Eduard Roux (AIMOVIG Reedy) Inject into the skin every 30 (thirty) days.    Marland Kitchen esomeprazole (NEXIUM) 20 MG capsule Take 20 mg by mouth at bedtime.    . metoprolol tartrate (LOPRESSOR) 25 MG tablet Take 3 tablets by mouth 2 (two) times daily.    . Multiple Vitamin (MULTIVITAMIN WITH MINERALS) TABS tablet Take 1 tablet by mouth daily.    . pravastatin (PRAVACHOL) 40 MG tablet Take 40 mg by mouth at bedtime.     No current facility-administered medications for this visit.     Allergies  Allergen Reactions  . Ciprofloxacin   . Codeine Nausea Only    Review of Systems:  neg    Physical Exam:    BP 126/70   Pulse (!) 59   Ht 5' 1.5" (1.562 m)   Wt 137 lb 6 oz (62.3 kg)   BMI 25.54 kg/m  Filed Weights   01/01/19 1040  Weight: 137 lb 6 oz (62.3 kg)   Constitutional:  Well-developed, in no acute distress. Psychiatric: Normal mood and affect. Behavior is normal. HEENT: Pupils normal.  Conjunctivae are normal. No scleral icterus. Neck supple.  Cardiovascular: Normal rate, regular rhythm. No edema Pulmonary/chest: Effort normal and breath sounds normal. No wheezing, rales or rhonchi. Abdominal: Soft, nondistended.  Mild lower abdominal tenderness. Bowel sounds active  throughout. There are no masses palpable. No hepatomegaly. Rectal:  defered Neurological: Alert and oriented to person place and time.  Has scoliosis Skin: Skin is warm and dry. No rashes noted. 25 minutes spent with the patient today. Greater than 50% was spent in counseling and coordination of care with the patient    Carmell Austria, MD 01/01/2019, 11:03 AM  Cc: Townsend Roger, MD

## 2019-01-01 NOTE — Patient Instructions (Signed)
If you are age 71 or older, your body mass index should be between 23-30. Your Body mass index is 25.54 kg/m. If this is out of the aforementioned range listed, please consider follow up with your Primary Care Provider.  If you are age 55 or younger, your body mass index should be between 19-25. Your Body mass index is 25.54 kg/m. If this is out of the aformentioned range listed, please consider follow up with your Primary Care Provider.   Please purchase the following medications over the counter and take as directed: Colace on Mon, Wed, Fri  Please use heating pads.   Thank you,  Dr. Jackquline Denmark

## 2019-01-04 DIAGNOSIS — M4120 Other idiopathic scoliosis, site unspecified: Secondary | ICD-10-CM | POA: Diagnosis not present

## 2019-01-10 DIAGNOSIS — M542 Cervicalgia: Secondary | ICD-10-CM | POA: Diagnosis not present

## 2019-01-10 DIAGNOSIS — M791 Myalgia, unspecified site: Secondary | ICD-10-CM | POA: Diagnosis not present

## 2019-01-10 DIAGNOSIS — G43719 Chronic migraine without aura, intractable, without status migrainosus: Secondary | ICD-10-CM | POA: Diagnosis not present

## 2019-01-10 DIAGNOSIS — G43019 Migraine without aura, intractable, without status migrainosus: Secondary | ICD-10-CM | POA: Diagnosis not present

## 2019-01-10 DIAGNOSIS — G518 Other disorders of facial nerve: Secondary | ICD-10-CM | POA: Diagnosis not present

## 2019-03-27 ENCOUNTER — Other Ambulatory Visit: Payer: Self-pay

## 2019-03-27 ENCOUNTER — Other Ambulatory Visit: Payer: Self-pay | Admitting: Gastroenterology

## 2019-03-27 MED ORDER — DEXLANSOPRAZOLE 60 MG PO CPDR: 60.0000 mg | DELAYED_RELEASE_CAPSULE | Freq: Every day | ORAL | 6 refills | Status: DC

## 2019-03-27 NOTE — Telephone Encounter (Signed)
Refills sent to pharmacy. 

## 2019-03-27 NOTE — Telephone Encounter (Signed)
Pt called stating that she needs refills of medication she only has 20 lft

## 2019-03-30 ENCOUNTER — Other Ambulatory Visit: Payer: Self-pay | Admitting: Gastroenterology

## 2019-03-30 MED ORDER — DEXLANSOPRAZOLE 60 MG PO CPDR: 60.0000 mg | DELAYED_RELEASE_CAPSULE | Freq: Every day | ORAL | 3 refills | Status: DC

## 2019-03-30 MED ORDER — DEXLANSOPRAZOLE 60 MG PO CPDR: 60.0000 mg | DELAYED_RELEASE_CAPSULE | Freq: Every day | ORAL | 6 refills | Status: DC

## 2019-03-30 NOTE — Telephone Encounter (Signed)
Pt called and stated that this should have been called in to [facility]

## 2019-03-30 NOTE — Telephone Encounter (Signed)
Called patient and asked her which pharmacy she wanted so I sent to mail order pharmacy  [facility]

## 2019-04-05 DIAGNOSIS — G43019 Migraine without aura, intractable, without status migrainosus: Secondary | ICD-10-CM | POA: Diagnosis not present

## 2019-04-05 DIAGNOSIS — G43719 Chronic migraine without aura, intractable, without status migrainosus: Secondary | ICD-10-CM | POA: Diagnosis not present

## 2019-05-07 DIAGNOSIS — M25511 Pain in right shoulder: Secondary | ICD-10-CM | POA: Diagnosis not present

## 2019-05-10 DIAGNOSIS — G43009 Migraine without aura, not intractable, without status migrainosus: Secondary | ICD-10-CM | POA: Diagnosis not present

## 2019-05-10 DIAGNOSIS — E7800 Pure hypercholesterolemia, unspecified: Secondary | ICD-10-CM | POA: Diagnosis not present

## 2019-05-10 DIAGNOSIS — R002 Palpitations: Secondary | ICD-10-CM | POA: Diagnosis not present

## 2019-05-10 DIAGNOSIS — K219 Gastro-esophageal reflux disease without esophagitis: Secondary | ICD-10-CM | POA: Diagnosis not present

## 2019-05-10 DIAGNOSIS — Z1389 Encounter for screening for other disorder: Secondary | ICD-10-CM | POA: Diagnosis not present

## 2019-05-10 DIAGNOSIS — Z6824 Body mass index (BMI) 24.0-24.9, adult: Secondary | ICD-10-CM | POA: Diagnosis not present

## 2019-05-16 DIAGNOSIS — M25511 Pain in right shoulder: Secondary | ICD-10-CM | POA: Diagnosis not present

## 2019-05-21 ENCOUNTER — Telehealth: Payer: Self-pay | Admitting: Gastroenterology

## 2019-05-21 NOTE — Telephone Encounter (Signed)
Patient called back wanting to informed the nurse that she was advised by her pcp office Dr. Nona Dell  that our nurse here has to call and request for a copy of the lab work. She provided number [phone]

## 2019-05-21 NOTE — Telephone Encounter (Signed)
Please do Thx RG

## 2019-05-21 NOTE — Telephone Encounter (Signed)
Called and spoke with patient-patient reports she had lab work completed at PCP office and was told her WBC is low; patient is having lab work repeated on 05/29/2019 to recheck levels; RN requested that patient request lab work be faxed to Dr. Steve Rattler office for review;  Patient is requesting to know WHEN she needs to have her endoscopy scheduled as her WBC's are low? Please advise

## 2019-05-22 NOTE — Telephone Encounter (Signed)
Thanks for taking care of that RG

## 2019-05-22 NOTE — Telephone Encounter (Signed)
Called Dr. Sharren Bridge office-requested labwork be faxed to GI office; receptionist stated it would be faxed today; Will place on Dr. Steve Rattler desk for review;

## 2019-05-28 DIAGNOSIS — S43431A Superior glenoid labrum lesion of right shoulder, initial encounter: Secondary | ICD-10-CM | POA: Diagnosis not present

## 2019-05-28 DIAGNOSIS — M19011 Primary osteoarthritis, right shoulder: Secondary | ICD-10-CM | POA: Diagnosis not present

## 2019-05-28 DIAGNOSIS — M75101 Unspecified rotator cuff tear or rupture of right shoulder, not specified as traumatic: Secondary | ICD-10-CM | POA: Diagnosis not present

## 2019-05-29 DIAGNOSIS — D7289 Other specified disorders of white blood cells: Secondary | ICD-10-CM | POA: Diagnosis not present

## 2019-05-30 ENCOUNTER — Telehealth (INDEPENDENT_AMBULATORY_CARE_PROVIDER_SITE_OTHER): Payer: Medicare Other | Admitting: Gastroenterology

## 2019-05-30 ENCOUNTER — Other Ambulatory Visit: Payer: Self-pay

## 2019-05-30 ENCOUNTER — Encounter: Payer: Self-pay | Admitting: Gastroenterology

## 2019-05-30 ENCOUNTER — Telehealth: Payer: Self-pay | Admitting: Gastroenterology

## 2019-05-30 VITALS — Ht 61.0 in | Wt 129.0 lb

## 2019-05-30 DIAGNOSIS — R1084 Generalized abdominal pain: Secondary | ICD-10-CM | POA: Diagnosis not present

## 2019-05-30 DIAGNOSIS — R634 Abnormal weight loss: Secondary | ICD-10-CM

## 2019-05-30 DIAGNOSIS — Z800 Family history of malignant neoplasm of digestive organs: Secondary | ICD-10-CM

## 2019-05-30 DIAGNOSIS — K581 Irritable bowel syndrome with constipation: Secondary | ICD-10-CM

## 2019-05-30 MED ORDER — SUPREP BOWEL PREP KIT 17.5-3.13-1.6 GM/177ML PO SOLN: 1.0000 | ORAL | 0 refills | Status: DC

## 2019-05-30 NOTE — Telephone Encounter (Signed)
Thank you for letting me know RG 

## 2019-05-30 NOTE — Telephone Encounter (Signed)
Patient called said that her PCP just called her and said that her [place] work came back normal.also, she canceled her procedure that was scheduled for 06/19/19 because she will have shoulder surgery on 8/4. She will call back in august to schedule for sept.

## 2019-05-30 NOTE — Telephone Encounter (Signed)
FYI

## 2019-05-30 NOTE — Progress Notes (Signed)
Chief Complaint: FU  Referring Provider:  Nona Dell, Corene Cornea, MD      ASSESSMENT AND PLAN;   #1. Generalized minimal abdominal pain with LLQ tenderness. Neg colon 10/2016 except for mild sigmoid diverticulosis.  Negative EGD 02/2016 except for small hiatal hernia, incidental gastric polyps, mild gastritis.  Neg SB Bx for celiac. Neg CT 10/2018.  #2. IBS with predominant constipation (H/O diarrhea in the past) #3. GERD. #4. FH of metastatic pancreatic cancer (sister). #5. Weight loss is concerning. Neg CT 10/2018 #6. Leukopenia ?etiology. No liver cirrhosis or splenomegaly on CT 10/2018. Being followed by Dr Nona Dell. May need hematology consult if persistent.  Plan: - Proceed with EGD/colon with miralax. Discussed risks & benefits. (Risks including rare perforation req laparotomy, bleeding after biopsies/polypectomy req blood transfusion, rare chance of missing neoplasms, risks of anesthesia/sedation). Benefits outweigh the risks. Patient agrees to proceed. All the questions were answered.  - Colace 1 tab po qd MWF. - Dexilant 60mg  po qd to continue for now. - Blood work from Dr Doran Durand office. - She will get in touch with Dr Nona Dell regarding back pain. - Boost 2/day in addition.  Need to eat more frequently. - Monitor weight. - FU in 12 weeks.   HPI:    Rebecca [name] is a 73 y.o. female  Goes to same church as Anderson Malta For follow-up visit  Abdominal pain is much better.  Has lost more weight from 135 pounds to 129 pounds over last 4 weeks. Has not been eating well. Feels full fast. ?early satiety.  Has been started on boost.  Also found to have low WBC count at 2.5.  She is being followed by Dr. Nona Dell.  Had blood work done this morning for follow-up.  Has not received results yet.  Negative CT scan of the abdomen/pelvis 10/2018.  Had negative urology work-up  Denies having any further constipation.  She has been using Colace 3-4 times per week.  Denies having any  upper abdominal pain.  No melena or hematochezia. No weight loss.  Willing to undergo GI eval.  Has been complaining of back pain.  She also has scoliosis.  Additional GI history: -Sister had metastatic pancreatic cancer -H/O abn LFTs d/t fatty liver Dx 2002, had liver biopsy 12/2004.  Negative autoimmune hepatitis profile, acute hepatitis profile, ANA.  No liver cirrhosis on CT 10/2018. Past Medical History:  Diagnosis Date  . Anxiety disorder   . Blood transfusion    Blood bank notified Ashley Akin)  . Blood transfusion without reported diagnosis 1984 x7   [facility]  . Complication of anesthesia 2006   Drop in  Bp intra-op with Lap Chole. [employer].  . Depression   . GERD (gastroesophageal reflux disease)   . Headache(784.0)    migraines  . History of colon polyps   . IBS (irritable bowel syndrome)   . Internal hemorrhoids without complication     Past Surgical History:  Procedure Laterality Date  . ABDOMINAL HYSTERECTOMY  1984  . APPENDECTOMY  1967  . BLADDER SUSPENSION  06/23/2011   Procedure: TRANSVAGINAL TAPE (TVT) PROCEDURE;  Surgeon: Daria Pastures;  Location: [facility];  Service: Gynecology;  Laterality: N/A;  . BREAST SURGERY     implants due to removal of breast tissue  . CATARACT EXTRACTION     Inserted prosthetic lens  . CHOLECYSTECTOMY     due to biliary dyskinesia  . COLONOSCOPY  10/11/2016   Minimal sigmoid diverticulosis. Otherwise normal  colonsocopy.   . CYSTOCELE REPAIR  06/23/2011   Procedure: ANTERIOR REPAIR (CYSTOCELE);  Surgeon: Daria Pastures;  Location: [facility];  Service: Gynecology;  Laterality: N/A;  . CYSTOSCOPY  06/23/2011   Procedure: CYSTOSCOPY;  Surgeon: Daria Pastures;  Location: [facility];  Service: Gynecology;  Laterality: N/A;  . DNS s/p nasal surgery    . ESOPHAGOGASTRODUODENOSCOPY  03/03/2016   Small hiatal hernia. Incidental gastric polyps. Mild gastritis.   . EYE MUSCLE SURGERY  as child  . HERNIA REPAIR  12/30/2010    inguinal. performed by Dr Noberto Retort   . KNEE ARTHROPLASTY  2006  . OVARIAN CYST REMOVAL     bilateral  . RECTOCELE REPAIR  11/10/2011   Procedure: POSTERIOR REPAIR (RECTOCELE);  Surgeon: Daria Pastures, MD;  Location: [facility];  Service: Gynecology;  Laterality: N/A;  . RECTOCELE REPAIR N/A 07/03/2014   Procedure: revision of POSTERIOR REPAIR (RECTOCELE) with bladder instillation ;  Surgeon: Daria Pastures, MD;  Location: [facility];  Service: Gynecology;  Laterality: N/A;  . RECTOCELE REPAIR N/A 07/03/2014   Procedure: POSTERIOR REPAIR (RECTOCELE) with suture repair for hemostasis ;  Surgeon: Daria Pastures, MD;  Location: [facility];  Service: Gynecology;  Laterality: N/A;  . ROTATOR CUFF REPAIR Left     Family History  Problem Relation Age of Onset  . Breast cancer Mother   . Pancreatic cancer Sister        metastatic  . Colon cancer Neg Hx   . Esophageal cancer Neg Hx     Social History   Tobacco Use  . Smoking status: Former Research scientist (life sciences)  . Smokeless tobacco: Never Used  . Tobacco comment: quit 1984  Substance Use Topics  . Alcohol use: No  . Drug use: No    Current Outpatient Medications  Medication Sig Dispense Refill  . b complex vitamins tablet Take 1 tablet by mouth daily.      . Cholecalciferol (VITAMIN D) 2000 UNITS tablet Take 2,000 Units by mouth daily.      . cyclobenzaprine (FLEXERIL) 10 MG tablet Take 10 mg by mouth 3 (three) times daily as needed for muscle spasms.    Marland Kitchen dexlansoprazole (DEXILANT) 60 MG capsule Take 1 capsule (60 mg total) by mouth daily. 90 capsule 3  . Erenumab-aooe (AIMOVIG Sayner) Inject into the skin every 30 (thirty) days.    . metoprolol tartrate (LOPRESSOR) 25 MG tablet Take 3 tablets by mouth 2 (two) times daily.    . Multiple Vitamin (MULTIVITAMIN WITH MINERALS) TABS tablet Take 1 tablet by mouth daily.    . pravastatin (PRAVACHOL) 40 MG tablet Take 40 mg by mouth at bedtime.    . traZODone (DESYREL) 50 MG tablet Take 50 mg by mouth at  bedtime.     No current facility-administered medications for this visit.     Allergies  Allergen Reactions  . Ciprofloxacin   . Codeine Nausea Only    Review of Systems:  neg    Physical Exam:    Ht 5\' 1"  (1.549 m)   Wt 129 lb (58.5 kg)   BMI 24.37 kg/m  Filed Weights   05/30/19 0836  Weight: 129 lb (58.5 kg)   Tele-visit  This service was provided via telemedicine.  The patient was located at home.  The provider was located in office.  The patient did consent to this telephone visit and is aware of possible charges through their insurance for this visit.  Time spent on call/coordination of care: 25  min    Carmell Austria, MD 05/30/2019, 10:45 AM  Cc: Townsend Roger, MD

## 2019-05-30 NOTE — Patient Instructions (Addendum)
If you are age 71 or older, your body mass index should be between 23-30. Your Body mass index is 24.37 kg/m. If this is out of the aforementioned range listed, please consider follow up with your Primary Care Provider.  If you are age 39 or younger, your body mass index should be between 19-25. Your Body mass index is 24.37 kg/m. If this is out of the aformentioned range listed, please consider follow up with your Primary Care Provider.   You have been scheduled for an endoscopy and colonoscopy. Please follow the written instructions given to you at your visit today. Please pick up your prep supplies at the pharmacy within the next 1-3 days. If you use inhalers (even only as needed), please bring them with you on the day of your procedure. Your physician has requested that you go to www.startemmi.com and enter the access code given to you at your visit today. This web site gives a general overview about your procedure. However, you should still follow specific instructions given to you by our office regarding your preparation for the procedure.  I have attached a Pre Procedure Patient Acknowledgement form and a prepaid envelope, please initial and sign form and mail back in envelope.   We have sent the following medications to your pharmacy for you to pick up at your convenience: Suprep  Please purchase the following medications over the counter and take as directed: Colace once daily on Monday, Wednesday and Friday.  Drink Boost two times daily.   Monitor weight.   Follow up 12 weeks.   Thank you,  Dr. Jackquline Denmark

## 2019-06-08 ENCOUNTER — Other Ambulatory Visit (HOSPITAL_COMMUNITY): Payer: Medicare Other

## 2019-06-09 HISTORY — PX: SHOULDER SURGERY: SHX246

## 2019-06-12 ENCOUNTER — Ambulatory Visit (HOSPITAL_BASED_OUTPATIENT_CLINIC_OR_DEPARTMENT_OTHER): Admit: 2019-06-12 | Payer: Medicare Other | Admitting: Specialist

## 2019-06-12 DIAGNOSIS — G8918 Other acute postprocedural pain: Secondary | ICD-10-CM | POA: Diagnosis not present

## 2019-06-12 DIAGNOSIS — S46191A Other injury of muscle, fascia and tendon of long head of biceps, right arm, initial encounter: Secondary | ICD-10-CM | POA: Diagnosis not present

## 2019-06-12 DIAGNOSIS — M19011 Primary osteoarthritis, right shoulder: Secondary | ICD-10-CM | POA: Diagnosis not present

## 2019-06-12 DIAGNOSIS — S46011A Strain of muscle(s) and tendon(s) of the rotator cuff of right shoulder, initial encounter: Secondary | ICD-10-CM | POA: Diagnosis not present

## 2019-06-12 DIAGNOSIS — S43491A Other sprain of right shoulder joint, initial encounter: Secondary | ICD-10-CM | POA: Diagnosis not present

## 2019-06-12 DIAGNOSIS — S43431A Superior glenoid labrum lesion of right shoulder, initial encounter: Secondary | ICD-10-CM | POA: Diagnosis not present

## 2019-06-12 DIAGNOSIS — X58XXXA Exposure to other specified factors, initial encounter: Secondary | ICD-10-CM | POA: Diagnosis not present

## 2019-06-12 DIAGNOSIS — Y999 Unspecified external cause status: Secondary | ICD-10-CM | POA: Diagnosis not present

## 2019-06-12 DIAGNOSIS — M7541 Impingement syndrome of right shoulder: Secondary | ICD-10-CM | POA: Diagnosis not present

## 2019-06-12 SURGERY — ARTHROSCOPY, SHOULDER, WITH ROTATOR CUFF REPAIR
Anesthesia: General | Laterality: Right

## 2019-06-19 ENCOUNTER — Encounter: Payer: Medicare Other | Admitting: Gastroenterology

## 2019-06-21 DIAGNOSIS — Z4789 Encounter for other orthopedic aftercare: Secondary | ICD-10-CM | POA: Diagnosis not present

## 2019-06-25 DIAGNOSIS — Z4789 Encounter for other orthopedic aftercare: Secondary | ICD-10-CM | POA: Diagnosis not present

## 2019-06-25 DIAGNOSIS — G8918 Other acute postprocedural pain: Secondary | ICD-10-CM | POA: Diagnosis not present

## 2019-06-26 DIAGNOSIS — Z4789 Encounter for other orthopedic aftercare: Secondary | ICD-10-CM | POA: Diagnosis not present

## 2019-06-26 DIAGNOSIS — M25611 Stiffness of right shoulder, not elsewhere classified: Secondary | ICD-10-CM | POA: Diagnosis not present

## 2019-06-26 DIAGNOSIS — R531 Weakness: Secondary | ICD-10-CM | POA: Diagnosis not present

## 2019-06-26 DIAGNOSIS — M25511 Pain in right shoulder: Secondary | ICD-10-CM | POA: Diagnosis not present

## 2019-06-27 DIAGNOSIS — M25611 Stiffness of right shoulder, not elsewhere classified: Secondary | ICD-10-CM | POA: Diagnosis not present

## 2019-06-27 DIAGNOSIS — R531 Weakness: Secondary | ICD-10-CM | POA: Diagnosis not present

## 2019-06-27 DIAGNOSIS — M25511 Pain in right shoulder: Secondary | ICD-10-CM | POA: Diagnosis not present

## 2019-06-27 DIAGNOSIS — Z4789 Encounter for other orthopedic aftercare: Secondary | ICD-10-CM | POA: Diagnosis not present

## 2019-07-05 DIAGNOSIS — G43719 Chronic migraine without aura, intractable, without status migrainosus: Secondary | ICD-10-CM | POA: Diagnosis not present

## 2019-07-05 DIAGNOSIS — R531 Weakness: Secondary | ICD-10-CM | POA: Diagnosis not present

## 2019-07-05 DIAGNOSIS — G43019 Migraine without aura, intractable, without status migrainosus: Secondary | ICD-10-CM | POA: Diagnosis not present

## 2019-07-05 DIAGNOSIS — M25611 Stiffness of right shoulder, not elsewhere classified: Secondary | ICD-10-CM | POA: Diagnosis not present

## 2019-07-05 DIAGNOSIS — M25511 Pain in right shoulder: Secondary | ICD-10-CM | POA: Diagnosis not present

## 2019-07-05 DIAGNOSIS — Z4789 Encounter for other orthopedic aftercare: Secondary | ICD-10-CM | POA: Diagnosis not present

## 2019-07-06 DIAGNOSIS — M25611 Stiffness of right shoulder, not elsewhere classified: Secondary | ICD-10-CM | POA: Diagnosis not present

## 2019-07-06 DIAGNOSIS — Z4789 Encounter for other orthopedic aftercare: Secondary | ICD-10-CM | POA: Diagnosis not present

## 2019-07-06 DIAGNOSIS — R531 Weakness: Secondary | ICD-10-CM | POA: Diagnosis not present

## 2019-07-06 DIAGNOSIS — M25511 Pain in right shoulder: Secondary | ICD-10-CM | POA: Diagnosis not present

## 2019-07-10 DIAGNOSIS — Z4789 Encounter for other orthopedic aftercare: Secondary | ICD-10-CM | POA: Diagnosis not present

## 2019-07-10 DIAGNOSIS — R531 Weakness: Secondary | ICD-10-CM | POA: Diagnosis not present

## 2019-07-10 DIAGNOSIS — M25511 Pain in right shoulder: Secondary | ICD-10-CM | POA: Diagnosis not present

## 2019-07-10 DIAGNOSIS — M25611 Stiffness of right shoulder, not elsewhere classified: Secondary | ICD-10-CM | POA: Diagnosis not present

## 2019-07-11 ENCOUNTER — Telehealth: Payer: Self-pay | Admitting: Gastroenterology

## 2019-07-13 DIAGNOSIS — M25511 Pain in right shoulder: Secondary | ICD-10-CM | POA: Diagnosis not present

## 2019-07-13 DIAGNOSIS — M25611 Stiffness of right shoulder, not elsewhere classified: Secondary | ICD-10-CM | POA: Diagnosis not present

## 2019-07-13 DIAGNOSIS — Z4789 Encounter for other orthopedic aftercare: Secondary | ICD-10-CM | POA: Diagnosis not present

## 2019-07-13 DIAGNOSIS — R531 Weakness: Secondary | ICD-10-CM | POA: Diagnosis not present

## 2019-07-17 DIAGNOSIS — M25611 Stiffness of right shoulder, not elsewhere classified: Secondary | ICD-10-CM | POA: Diagnosis not present

## 2019-07-17 DIAGNOSIS — Z4789 Encounter for other orthopedic aftercare: Secondary | ICD-10-CM | POA: Diagnosis not present

## 2019-07-17 DIAGNOSIS — R531 Weakness: Secondary | ICD-10-CM | POA: Diagnosis not present

## 2019-07-17 DIAGNOSIS — M25511 Pain in right shoulder: Secondary | ICD-10-CM | POA: Diagnosis not present

## 2019-07-18 DIAGNOSIS — Z4789 Encounter for other orthopedic aftercare: Secondary | ICD-10-CM | POA: Diagnosis not present

## 2019-07-18 DIAGNOSIS — M25511 Pain in right shoulder: Secondary | ICD-10-CM | POA: Diagnosis not present

## 2019-07-18 DIAGNOSIS — R531 Weakness: Secondary | ICD-10-CM | POA: Diagnosis not present

## 2019-07-18 DIAGNOSIS — M25611 Stiffness of right shoulder, not elsewhere classified: Secondary | ICD-10-CM | POA: Diagnosis not present

## 2019-07-18 NOTE — Telephone Encounter (Signed)
Went over [employer] with patient. Patient verbalized understanding of instructions. Patient will call us with any further questions.

## 2019-07-23 ENCOUNTER — Telehealth: Payer: Self-pay | Admitting: Gastroenterology

## 2019-07-23 NOTE — Telephone Encounter (Signed)
Pt responded "no" to all screening questions °

## 2019-07-23 NOTE — Telephone Encounter (Signed)
Do you now or have you had a fever in the last 14 days?         Do you have any respiratory symptoms of shortness of breath or cough now or in the last 14 days?        Do you have any family members or close contacts with diagnosed or suspected Covid-19 in the past 14 days?         Have you been tested for Covid-19 and found to be positive?        Pt made aware to check in on theth floor and that care partner may wait in the car or come up to the lobby during the procedure but will need to provide their own mask.

## 2019-07-24 ENCOUNTER — Other Ambulatory Visit: Payer: Self-pay

## 2019-07-24 ENCOUNTER — Encounter: Payer: Self-pay | Admitting: Gastroenterology

## 2019-07-24 ENCOUNTER — Ambulatory Visit (AMBULATORY_SURGERY_CENTER): Payer: Medicare Other | Admitting: Gastroenterology

## 2019-07-24 VITALS — BP 119/55 | HR 62 | Temp 97.6°F | Resp 13 | Ht 61.0 in | Wt 129.0 lb

## 2019-07-24 DIAGNOSIS — K449 Diaphragmatic hernia without obstruction or gangrene: Secondary | ICD-10-CM | POA: Diagnosis not present

## 2019-07-24 DIAGNOSIS — K581 Irritable bowel syndrome with constipation: Secondary | ICD-10-CM

## 2019-07-24 DIAGNOSIS — D125 Benign neoplasm of sigmoid colon: Secondary | ICD-10-CM

## 2019-07-24 DIAGNOSIS — K2951 Unspecified chronic gastritis with bleeding: Secondary | ICD-10-CM | POA: Diagnosis not present

## 2019-07-24 DIAGNOSIS — K5730 Diverticulosis of large intestine without perforation or abscess without bleeding: Secondary | ICD-10-CM

## 2019-07-24 DIAGNOSIS — D120 Benign neoplasm of cecum: Secondary | ICD-10-CM | POA: Diagnosis not present

## 2019-07-24 DIAGNOSIS — K317 Polyp of stomach and duodenum: Secondary | ICD-10-CM

## 2019-07-24 DIAGNOSIS — K648 Other hemorrhoids: Secondary | ICD-10-CM | POA: Diagnosis not present

## 2019-07-24 DIAGNOSIS — K219 Gastro-esophageal reflux disease without esophagitis: Secondary | ICD-10-CM | POA: Diagnosis not present

## 2019-07-24 DIAGNOSIS — R109 Unspecified abdominal pain: Secondary | ICD-10-CM | POA: Diagnosis not present

## 2019-07-24 DIAGNOSIS — K589 Irritable bowel syndrome without diarrhea: Secondary | ICD-10-CM | POA: Diagnosis not present

## 2019-07-24 DIAGNOSIS — K2970 Gastritis, unspecified, without bleeding: Secondary | ICD-10-CM | POA: Diagnosis not present

## 2019-07-24 DIAGNOSIS — R1084 Generalized abdominal pain: Secondary | ICD-10-CM

## 2019-07-24 MED ORDER — SODIUM CHLORIDE 0.9 % IV SOLN: 500.0000 mL | Freq: Once | INTRAVENOUS | Status: DC

## 2019-07-24 NOTE — Progress Notes (Signed)
Pt. Asleep when Dr. Lyndel Safe first came in the Recovery Room.  Pt. Waited in consultation area until he came through again.  D/C time delayed.

## 2019-07-24 NOTE — Op Note (Signed)
[place] [facility] Patient Name: Rebecca [name] Procedure Date: 07/24/2019 8:33 AM MRN: [id number] Endoscopist: Jackquline Denmark , MD Age: 73 Referring MD:  Date of Birth: [birth year] Gender: Female Account #: [id number] Procedure:                Colonoscopy Indications:              Generalized abdominal pain, weight loss, IBS with                            predominant constipation. Medicines:                Monitored Anesthesia Care Procedure:                Pre-Anesthesia Assessment:                           - Prior to the procedure, a History and Physical                            was performed, and patient medications and                            allergies were reviewed. The patient's tolerance of                            previous anesthesia was also reviewed. The risks                            and benefits of the procedure and the sedation                            options and risks were discussed with the patient.                            All questions were answered, and informed consent                            was obtained. Prior Anticoagulants: The patient has                            taken no previous anticoagulant or antiplatelet                            agents. ASA Grade Assessment: II - A patient with                            mild systemic disease. After reviewing the risks                            and benefits, the patient was deemed in                            satisfactory condition to undergo the procedure.  After obtaining informed consent, the colonoscope                            was passed under direct vision. Throughout the                            procedure, the patient's blood pressure, pulse, and                            oxygen saturations were monitored continuously. The                            Colonoscope was introduced through the anus and                            advanced to the 2 cm into the ileum.  The                            colonoscopy was performed without difficulty. The                            patient tolerated the procedure well. The quality                            of the bowel preparation was good. The terminal                            ileum, ileocecal valve, appendiceal orifice, and                            rectum were photographed. Scope In: 8:56:18 AM Scope Out: 9:11:52 AM Scope Withdrawal Time: 0 hours 11 minutes 33 seconds  Total Procedure Duration: 0 hours 15 minutes 34 seconds  Findings:                 Two sessile polyps were found in the proximal                            sigmoid colon and cecum. The polyps were 2 to 6 mm                            in size. These polyps were removed with a cold                            snare. Resection and retrieval were complete.                            Estimated blood loss: none.                           A few small-mouthed diverticula were found in the                            sigmoid colon.  Non-bleeding internal hemorrhoids were found during                            retroflexion. The hemorrhoids were small.                           The terminal ileum appeared normal.                           The exam was otherwise without abnormality on                            direct and retroflexion views. The colon was                            somewhat redundant. Complications:            No immediate complications. Estimated Blood Loss:     Estimated blood loss: none. Impression:               -Small colonic polyps S/P polypectomy                           -Mild sigmoid diverticulosis.                           -Otherwise normal colonoscopy to TI. The colon was                            highly redundant. Recommendation:           - Patient has a contact number available for                            emergencies. The signs and symptoms of potential                            delayed  complications were discussed with the                            patient. Return to normal activities tomorrow.                            Written discharge instructions were provided to the                            patient.                           - Resume previous diet.                           - Continue present medications.                           - Await pathology results.                           -  Repeat colonoscopy for surveillance based on                            pathology results.                           - Return to GI clinic in 12 weeks. Jackquline Denmark, MD 07/24/2019 9:23:04 AM This report has been signed electronically.

## 2019-07-24 NOTE — Progress Notes (Signed)
Temp check by AM/vital check by CW.  Medical and surgical history reviewed and updated as necessary.

## 2019-07-24 NOTE — Progress Notes (Signed)
PT taken to PACU. Monitors in place. VSS. Report given to RN. 

## 2019-07-24 NOTE — Patient Instructions (Addendum)
Impression/Recommendations:  Hiatal Hernia handout given to patient. Gastritis handout given to patient. Diverticulosis handout given to patient. Polyp handout given to patient.  Resume previous diet. Continue present medications.  No aspirin, ibuprofen, naproxen, or other NSAID drugs for 5 days after polyp removal.  YOU HAD AN ENDOSCOPIC PROCEDURE TODAY AT [facility]:   Refer to the procedure report that was given to you for any specific questions about what was found during the examination.  If the procedure report does not answer your questions, please call your gastroenterologist to clarify.  If you requested that your care partner not be given the details of your procedure findings, then the procedure report has been included in a sealed envelope for you to review at your convenience later.  YOU SHOULD EXPECT: Some feelings of bloating in the abdomen. Passage of more gas than usual.  Walking can help get rid of the air that was put into your GI tract during the procedure and reduce the bloating. If you had a lower endoscopy (such as a colonoscopy or flexible sigmoidoscopy) you may notice spotting of blood in your stool or on the toilet paper. If you underwent a bowel prep for your procedure, you may not have a normal bowel movement for a few days.  Please Note:  You might notice some irritation and congestion in your nose or some drainage.  This is from the oxygen used during your procedure.  There is no need for concern and it should clear up in a day or so.  SYMPTOMS TO REPORT IMMEDIATELY:   Following lower endoscopy (colonoscopy or flexible sigmoidoscopy):  Excessive amounts of blood in the stool  Significant tenderness or worsening of abdominal pains  Swelling of the abdomen that is new, acute  Fever of 100F or higher   Following upper endoscopy (EGD)  Vomiting of blood or coffee ground material  New chest pain or pain under the shoulder blades  Painful or  persistently difficult swallowing  New shortness of breath  Fever of 100F or higher  Black, tarry-looking stools  For urgent or emergent issues, a gastroenterologist can be reached at any hour by calling [phone].   DIET:  We do recommend a small meal at first, but then you may proceed to your regular diet.  Drink plenty of fluids but you should avoid alcoholic beverages for 24 hours.  ACTIVITY:  You should plan to take it easy for the rest of today and you should NOT DRIVE or use heavy machinery until tomorrow (because of the sedation medicines used during the test).    FOLLOW UP: Our staff will call the number listed on your records 48-72 hours following your procedure to check on you and address any questions or concerns that you may have regarding the information given to you following your procedure. If we do not reach you, we will leave a message.  We will attempt to reach you two times.  During this call, we will ask if you have developed any symptoms of COVID 19. If you develop any symptoms (ie: fever, flu-like symptoms, shortness of breath, cough etc.) before then, please call [phone].  If you test positive for Covid 19 in the 2 weeks post procedure, please call and report this information to [place].    If any biopsies were taken you will be contacted by phone or by letter within the next 1-3 weeks.  Please call us at [phone] if you have not heard about the biopsies  in 3 weeks.    SIGNATURES/CONFIDENTIALITY: You and/or your care partner have signed paperwork which will be entered into your electronic medical record.  These signatures attest to the fact that that the information above on your After Visit Summary has been reviewed and is understood.  Full responsibility of the confidentiality of this discharge information lies with you and/or your care-partner., ibuprofen, naproxen, or other NSAID drugs for 5 days after polyp removal.    Await pathology results.  Repeat  colonoscopy for surveillance based on pathology results.  Return to GI clinic in 12 weeks.

## 2019-07-24 NOTE — Op Note (Signed)
[name] Procedure Date: 07/24/2019 8:33 AM MRN: [id number] Endoscopist: Jackquline Denmark , MD Age: 73 Referring MD:  Date of Birth: 1946-01-27 Gender: Female Account #: [id number] Procedure:                Upper GI endoscopy Indications:              GERD, generalized abdominal pain, weight loss. Medicines:                Monitored Anesthesia Care Procedure:                Pre-Anesthesia Assessment:                           - Prior to the procedure, a History and Physical                            was performed, and patient medications and                            allergies were reviewed. The patient's tolerance of                            previous anesthesia was also reviewed. The risks                            and benefits of the procedure and the sedation                            options and risks were discussed with the patient.                            All questions were answered, and informed consent                            was obtained. Prior Anticoagulants: The patient has                            taken no previous anticoagulant or antiplatelet                            agents. ASA Grade Assessment: II - A patient with                            mild systemic disease. After reviewing the risks                            and benefits, the patient was deemed in                            satisfactory condition to undergo the procedure.                           After obtaining informed consent, the endoscope was  passed under direct vision. Throughout the                            procedure, the patient's blood pressure, pulse, and                            oxygen saturations were monitored continuously. The                            Endoscope was introduced through the mouth, and                            advanced to the second part of duodenum. The upper                            GI endoscopy  was accomplished without difficulty.                            The patient tolerated the procedure well. Scope In: Scope Out: Findings:                 The examined esophagus was mildly tortuous but                            normal.                           The Z-line was regular and was found 32 cm from the                            incisors.                           A small hiatal hernia was present.                           Localized moderate inflammation characterized by                            erythema was found in the gastric body and antrum.                            Biopsies were taken with a cold forceps for                            histology.                           Multiple (15-20) semi-sessile polyps (ranging from                            6 mm to 1 cm) with no bleeding and no stigmata of                            recent bleeding were found in the gastric fundus  and in the gastric body. 3 polyps were removed with                            a hot snare. Resection and retrieval were complete.                            Estimated blood loss: none.                           The examined duodenum was normal. Biopsies for                            histology were taken with a cold forceps for                            evaluation of celiac disease. Complications:            No immediate complications. Estimated Blood Loss:     Estimated blood loss: none. Impression:               - Small hiatal hernia.                           - Gastritis. Biopsied.                           - Multiple gastric polyps (s/p polypectomy x 3). Recommendation:           - Patient has a contact number available for                            emergencies. The signs and symptoms of potential                            delayed complications were discussed with the                            patient. Return to normal activities tomorrow.                             Written discharge instructions were provided to the                            patient.                           - Resume previous diet.                           - Continue present medications.                           - No aspirin, ibuprofen, naproxen, or other                            non-steroidal anti-inflammatory drugs for 5 days  after polyp removal.                           - Await pathology results. Jackquline Denmark, MD 07/24/2019 9:19:21 AM This report has been signed electronically.

## 2019-07-24 NOTE — Progress Notes (Signed)
Called to room to assist during endoscopic procedure.  Patient ID and intended procedure confirmed with present staff. Received instructions for my participation in the procedure from the performing physician.  

## 2019-07-25 DIAGNOSIS — M25611 Stiffness of right shoulder, not elsewhere classified: Secondary | ICD-10-CM | POA: Diagnosis not present

## 2019-07-25 DIAGNOSIS — M25511 Pain in right shoulder: Secondary | ICD-10-CM | POA: Diagnosis not present

## 2019-07-25 DIAGNOSIS — R531 Weakness: Secondary | ICD-10-CM | POA: Diagnosis not present

## 2019-07-25 DIAGNOSIS — Z4789 Encounter for other orthopedic aftercare: Secondary | ICD-10-CM | POA: Diagnosis not present

## 2019-07-26 ENCOUNTER — Telehealth: Payer: Self-pay

## 2019-07-26 DIAGNOSIS — M25511 Pain in right shoulder: Secondary | ICD-10-CM | POA: Diagnosis not present

## 2019-07-26 DIAGNOSIS — R531 Weakness: Secondary | ICD-10-CM | POA: Diagnosis not present

## 2019-07-26 DIAGNOSIS — M25611 Stiffness of right shoulder, not elsewhere classified: Secondary | ICD-10-CM | POA: Diagnosis not present

## 2019-07-26 DIAGNOSIS — Z4789 Encounter for other orthopedic aftercare: Secondary | ICD-10-CM | POA: Diagnosis not present

## 2019-07-26 NOTE — Telephone Encounter (Signed)
Covid-19 screening questions   Do you now or have you had a fever in the last 14 days? No.  Do you have any respiratory symptoms of shortness of breath or cough now or in the last 14 days? No.  Do you have any family members or close contacts with diagnosed or suspected Covid-19 in the past 14 days? No.  Have you been tested for Covid-19 and found to be positive? No.       Follow up Call-  Call back number 07/24/2019  Post procedure Call Back phone  # [phone]  Permission to leave phone message Yes  Some recent data might be hidden     Patient questions:  Do you have a fever, pain , or abdominal swelling? No. Pain Score  0 *  Have you tolerated food without any problems? Yes.    Have you been able to return to your normal activities? Yes.    Do you have any questions about your discharge instructions: Diet   No. Medications  No. Follow up visit  No.  Do you have questions or concerns about your Care? No.  Actions: * If pain score is 4 or above: No action needed, pain <4.

## 2019-07-29 ENCOUNTER — Encounter: Payer: Self-pay | Admitting: Gastroenterology

## 2019-07-31 DIAGNOSIS — M25611 Stiffness of right shoulder, not elsewhere classified: Secondary | ICD-10-CM | POA: Diagnosis not present

## 2019-07-31 DIAGNOSIS — R531 Weakness: Secondary | ICD-10-CM | POA: Diagnosis not present

## 2019-07-31 DIAGNOSIS — Z4789 Encounter for other orthopedic aftercare: Secondary | ICD-10-CM | POA: Diagnosis not present

## 2019-07-31 DIAGNOSIS — M25511 Pain in right shoulder: Secondary | ICD-10-CM | POA: Diagnosis not present

## 2019-08-02 DIAGNOSIS — M25611 Stiffness of right shoulder, not elsewhere classified: Secondary | ICD-10-CM | POA: Diagnosis not present

## 2019-08-02 DIAGNOSIS — Z4789 Encounter for other orthopedic aftercare: Secondary | ICD-10-CM | POA: Diagnosis not present

## 2019-08-02 DIAGNOSIS — M25511 Pain in right shoulder: Secondary | ICD-10-CM | POA: Diagnosis not present

## 2019-08-02 DIAGNOSIS — R531 Weakness: Secondary | ICD-10-CM | POA: Diagnosis not present

## 2019-08-06 DIAGNOSIS — R531 Weakness: Secondary | ICD-10-CM | POA: Diagnosis not present

## 2019-08-06 DIAGNOSIS — M25611 Stiffness of right shoulder, not elsewhere classified: Secondary | ICD-10-CM | POA: Diagnosis not present

## 2019-08-06 DIAGNOSIS — Z4789 Encounter for other orthopedic aftercare: Secondary | ICD-10-CM | POA: Diagnosis not present

## 2019-08-06 DIAGNOSIS — M25511 Pain in right shoulder: Secondary | ICD-10-CM | POA: Diagnosis not present

## 2019-08-08 DIAGNOSIS — M25511 Pain in right shoulder: Secondary | ICD-10-CM | POA: Diagnosis not present

## 2019-08-08 DIAGNOSIS — R531 Weakness: Secondary | ICD-10-CM | POA: Diagnosis not present

## 2019-08-08 DIAGNOSIS — M25611 Stiffness of right shoulder, not elsewhere classified: Secondary | ICD-10-CM | POA: Diagnosis not present

## 2019-08-08 DIAGNOSIS — Z4789 Encounter for other orthopedic aftercare: Secondary | ICD-10-CM | POA: Diagnosis not present

## 2019-08-14 DIAGNOSIS — Z4789 Encounter for other orthopedic aftercare: Secondary | ICD-10-CM | POA: Diagnosis not present

## 2019-08-14 DIAGNOSIS — R531 Weakness: Secondary | ICD-10-CM | POA: Diagnosis not present

## 2019-08-14 DIAGNOSIS — M25511 Pain in right shoulder: Secondary | ICD-10-CM | POA: Diagnosis not present

## 2019-08-14 DIAGNOSIS — M25611 Stiffness of right shoulder, not elsewhere classified: Secondary | ICD-10-CM | POA: Diagnosis not present

## 2019-08-16 DIAGNOSIS — M25611 Stiffness of right shoulder, not elsewhere classified: Secondary | ICD-10-CM | POA: Diagnosis not present

## 2019-08-16 DIAGNOSIS — Z23 Encounter for immunization: Secondary | ICD-10-CM | POA: Diagnosis not present

## 2019-08-16 DIAGNOSIS — Z4789 Encounter for other orthopedic aftercare: Secondary | ICD-10-CM | POA: Diagnosis not present

## 2019-08-16 DIAGNOSIS — R531 Weakness: Secondary | ICD-10-CM | POA: Diagnosis not present

## 2019-08-16 DIAGNOSIS — M25511 Pain in right shoulder: Secondary | ICD-10-CM | POA: Diagnosis not present

## 2019-08-21 DIAGNOSIS — Z4789 Encounter for other orthopedic aftercare: Secondary | ICD-10-CM | POA: Diagnosis not present

## 2019-08-21 DIAGNOSIS — M25611 Stiffness of right shoulder, not elsewhere classified: Secondary | ICD-10-CM | POA: Diagnosis not present

## 2019-08-21 DIAGNOSIS — M25511 Pain in right shoulder: Secondary | ICD-10-CM | POA: Diagnosis not present

## 2019-08-21 DIAGNOSIS — R531 Weakness: Secondary | ICD-10-CM | POA: Diagnosis not present

## 2019-08-23 DIAGNOSIS — Z4789 Encounter for other orthopedic aftercare: Secondary | ICD-10-CM | POA: Diagnosis not present

## 2019-08-23 DIAGNOSIS — M25611 Stiffness of right shoulder, not elsewhere classified: Secondary | ICD-10-CM | POA: Diagnosis not present

## 2019-08-23 DIAGNOSIS — M25511 Pain in right shoulder: Secondary | ICD-10-CM | POA: Diagnosis not present

## 2019-08-23 DIAGNOSIS — R531 Weakness: Secondary | ICD-10-CM | POA: Diagnosis not present

## 2019-08-27 DIAGNOSIS — R531 Weakness: Secondary | ICD-10-CM | POA: Diagnosis not present

## 2019-08-27 DIAGNOSIS — M25611 Stiffness of right shoulder, not elsewhere classified: Secondary | ICD-10-CM | POA: Diagnosis not present

## 2019-08-27 DIAGNOSIS — M25511 Pain in right shoulder: Secondary | ICD-10-CM | POA: Diagnosis not present

## 2019-08-27 DIAGNOSIS — Z4789 Encounter for other orthopedic aftercare: Secondary | ICD-10-CM | POA: Diagnosis not present

## 2019-08-29 DIAGNOSIS — M25511 Pain in right shoulder: Secondary | ICD-10-CM | POA: Diagnosis not present

## 2019-08-29 DIAGNOSIS — Z4789 Encounter for other orthopedic aftercare: Secondary | ICD-10-CM | POA: Diagnosis not present

## 2019-08-29 DIAGNOSIS — M25611 Stiffness of right shoulder, not elsewhere classified: Secondary | ICD-10-CM | POA: Diagnosis not present

## 2019-08-29 DIAGNOSIS — R531 Weakness: Secondary | ICD-10-CM | POA: Diagnosis not present

## 2019-09-03 DIAGNOSIS — M25611 Stiffness of right shoulder, not elsewhere classified: Secondary | ICD-10-CM | POA: Diagnosis not present

## 2019-09-03 DIAGNOSIS — R531 Weakness: Secondary | ICD-10-CM | POA: Diagnosis not present

## 2019-09-03 DIAGNOSIS — Z4789 Encounter for other orthopedic aftercare: Secondary | ICD-10-CM | POA: Diagnosis not present

## 2019-09-03 DIAGNOSIS — M25511 Pain in right shoulder: Secondary | ICD-10-CM | POA: Diagnosis not present

## 2019-09-05 DIAGNOSIS — M25611 Stiffness of right shoulder, not elsewhere classified: Secondary | ICD-10-CM | POA: Diagnosis not present

## 2019-09-05 DIAGNOSIS — Z4789 Encounter for other orthopedic aftercare: Secondary | ICD-10-CM | POA: Diagnosis not present

## 2019-09-05 DIAGNOSIS — R531 Weakness: Secondary | ICD-10-CM | POA: Diagnosis not present

## 2019-09-05 DIAGNOSIS — M25511 Pain in right shoulder: Secondary | ICD-10-CM | POA: Diagnosis not present

## 2019-09-11 DIAGNOSIS — R531 Weakness: Secondary | ICD-10-CM | POA: Diagnosis not present

## 2019-09-11 DIAGNOSIS — M25511 Pain in right shoulder: Secondary | ICD-10-CM | POA: Diagnosis not present

## 2019-09-11 DIAGNOSIS — M25611 Stiffness of right shoulder, not elsewhere classified: Secondary | ICD-10-CM | POA: Diagnosis not present

## 2019-09-11 DIAGNOSIS — Z4789 Encounter for other orthopedic aftercare: Secondary | ICD-10-CM | POA: Diagnosis not present

## 2019-09-26 ENCOUNTER — Other Ambulatory Visit: Payer: Self-pay | Admitting: Obstetrics and Gynecology

## 2019-09-26 DIAGNOSIS — Z1231 Encounter for screening mammogram for malignant neoplasm of breast: Secondary | ICD-10-CM

## 2019-10-08 DIAGNOSIS — G43019 Migraine without aura, intractable, without status migrainosus: Secondary | ICD-10-CM | POA: Diagnosis not present

## 2019-10-08 DIAGNOSIS — G43719 Chronic migraine without aura, intractable, without status migrainosus: Secondary | ICD-10-CM | POA: Diagnosis not present

## 2019-10-11 DIAGNOSIS — Z20828 Contact with and (suspected) exposure to other viral communicable diseases: Secondary | ICD-10-CM | POA: Diagnosis not present

## 2019-10-18 ENCOUNTER — Encounter: Payer: Self-pay | Admitting: Gastroenterology

## 2019-10-18 ENCOUNTER — Other Ambulatory Visit: Payer: Self-pay

## 2019-10-18 ENCOUNTER — Telehealth (INDEPENDENT_AMBULATORY_CARE_PROVIDER_SITE_OTHER): Payer: Medicare Other | Admitting: Gastroenterology

## 2019-10-18 VITALS — Ht 61.0 in | Wt 125.0 lb

## 2019-10-18 DIAGNOSIS — K581 Irritable bowel syndrome with constipation: Secondary | ICD-10-CM | POA: Diagnosis not present

## 2019-10-18 NOTE — Patient Instructions (Signed)
If you are age 71 or older, your body mass index should be between 23-30. Your Body mass index is 23.62 kg/m. If this is out of the aforementioned range listed, please consider follow up with your Primary Care Provider.  If you are age 70 or younger, your body mass index should be between 19-25. Your Body mass index is 23.62 kg/m. If this is out of the aformentioned range listed, please consider follow up with your Primary Care Provider.   Heating pads as needed.   Please purchase the following medications over the counter and take as directed: Colace once daily on weekdays, none on the weekends.   Can try Dexilant every other day.  Thank you,  Dr. Jackquline Denmark

## 2019-10-18 NOTE — Progress Notes (Signed)
Chief Complaint: FU  Referring Provider:  Nona Dell, Corene Cornea, MD      ASSESSMENT AND PLAN;   #1. R rib pain -likely musculoskeletal  #2. H/O tubular adenomas.  Next colon due 07/2026   #3. IBS with predominant constipation (H/O diarrhea in the past). Neg colon 07/2019 except for mild sigmoid diverticulosis, small tubular adenomas.   #4. GERD with small HH. EGD 07/2019 with small HH, gastric fundic gland polyps. Neg SB Bx for celiac. Neg CT AP 10/2018.  #5. FH metastatic pancreatic cancer (sister).  Plan: - Heating pads prn.  If still with right rib pain, she will get in touch with Dr. Nona Dell. - Colace 1 tab po qd on weekdays. Not over the weekends. - Can try Dexilant 60mg  po qod - FU in 6 months.    HPI:    Rebecca [name] is a 73 y.o. female  Goes to same church as Anderson [employer] For follow-up visit  Does feel better Negative CT scan of the abdomen/pelvis February 2019. Neg EGD/colonoscopy for any acute abnormalities.  Report as above. Had negative urology work-up.  No further weight loss.  If she takes Colace 3 times per week, she would still be constipated.  When she increases it to every day she gets diarrhea.  Denies having any abdominal pain.  No fever chills night sweats.  Denies having any upper GI symptoms as long as she takes Dexilant.  She has not reduced it to every other day but is willing to try.  Had right rib pain-exacerbated by walking.  Thinks it is musculoskeletal.  Additional GI history: -Sister had metastatic pancreatic cancer -History of abnormal liver function tests due to fatty liver diagnosed 2002, had liver biopsy 12/2004.  Negative autoimmune hepatitis profile, acute hepatitis profile, ANA Past Medical History:  Diagnosis Date  . Anxiety disorder   . Blood transfusion    Blood bank notified Ashley Akin)  . Blood transfusion without reported diagnosis 1984 x7   [facility]  . Complication of anesthesia 2006   Drop in  Bp intra-op with Lap Chole.  [employer].  . Depression   . GERD (gastroesophageal reflux disease)   . Headache(784.0)    migraines  . History of colon polyps   . IBS (irritable bowel syndrome)   . Internal hemorrhoids without complication     Past Surgical History:  Procedure Laterality Date  . ABDOMINAL HYSTERECTOMY  1984  . APPENDECTOMY  1967  . BLADDER SUSPENSION  06/23/2011   Procedure: TRANSVAGINAL TAPE (TVT) PROCEDURE;  Surgeon: Daria Pastures;  Location: [facility];  Service: Gynecology;  Laterality: N/A;  . BREAST SURGERY     implants due to removal of breast tissue  . CATARACT EXTRACTION     Inserted prosthetic lens  . CHOLECYSTECTOMY     due to biliary dyskinesia  . COLONOSCOPY  10/11/2016   Minimal sigmoid diverticulosis. Otherwise normal colonsocopy.   . CYSTOCELE REPAIR  06/23/2011   Procedure: ANTERIOR REPAIR (CYSTOCELE);  Surgeon: Daria Pastures;  Location: [facility];  Service: Gynecology;  Laterality: N/A;  . CYSTOSCOPY  06/23/2011   Procedure: CYSTOSCOPY;  Surgeon: Daria Pastures;  Location: [facility];  Service: Gynecology;  Laterality: N/A;  . DNS s/p nasal surgery    . ESOPHAGOGASTRODUODENOSCOPY  03/03/2016   Small hiatal hernia. Incidental gastric polyps. Mild gastritis.   . EYE MUSCLE SURGERY  as child  . HERNIA REPAIR  12/30/2010   inguinal. performed by Dr Noberto Retort   . KNEE  ARTHROPLASTY  2006  . OVARIAN CYST REMOVAL     bilateral  . RECTOCELE REPAIR  11/10/2011   Procedure: POSTERIOR REPAIR (RECTOCELE);  Surgeon: Daria Pastures, MD;  Location: [facility];  Service: Gynecology;  Laterality: N/A;  . RECTOCELE REPAIR N/A 07/03/2014   Procedure: revision of POSTERIOR REPAIR (RECTOCELE) with bladder instillation ;  Surgeon: Daria Pastures, MD;  Location: [facility];  Service: Gynecology;  Laterality: N/A;  . RECTOCELE REPAIR N/A 07/03/2014   Procedure: POSTERIOR REPAIR (RECTOCELE) with suture repair for hemostasis ;  Surgeon: Daria Pastures, MD;  Location: [facility];  Service:  Gynecology;  Laterality: N/A;  . ROTATOR CUFF REPAIR Left   . SHOULDER SURGERY      Family History  Problem Relation Age of Onset  . Breast cancer Mother   . Pancreatic cancer Sister        metastatic  . Colon cancer Neg Hx   . Esophageal cancer Neg Hx   . Rectal cancer Neg Hx   . Stomach cancer Neg Hx     Social History   Tobacco Use  . Smoking status: Former Research scientist (life sciences)  . Smokeless tobacco: Never Used  . Tobacco comment: quit 1984  Substance Use Topics  . Alcohol use: No  . Drug use: No    Current Outpatient Medications  Medication Sig Dispense Refill  . Cholecalciferol (VITAMIN D) 2000 UNITS tablet Take 2,000 Units by mouth daily.      . cyclobenzaprine (FLEXERIL) 10 MG tablet Take 10 mg by mouth 3 (three) times daily as needed for muscle spasms.    Marland Kitchen dexlansoprazole (DEXILANT) 60 MG capsule Take 1 capsule (60 mg total) by mouth daily. 90 capsule 3  . Erenumab-aooe (AIMOVIG [place]) Inject into the skin every 30 (thirty) days.    Marland Kitchen lactose free nutrition (BOOST) LIQD Take 237 mLs by mouth 2 (two) times daily between meals.    . metoprolol tartrate (LOPRESSOR) 25 MG tablet Take 3 tablets by mouth 2 (two) times daily.    . pravastatin (PRAVACHOL) 40 MG tablet Take 40 mg by mouth at bedtime.    . traZODone (DESYREL) 50 MG tablet Take 50 mg by mouth at bedtime.    Marland Kitchen b complex vitamins tablet Take 1 tablet by mouth daily.       Current Facility-Administered Medications  Medication Dose Route Frequency Provider Last Rate Last Admin  . 0.9 %  sodium chloride infusion  500 mL Intravenous Once Jackquline Denmark, MD        Allergies  Allergen Reactions  . Ciprofloxacin   . Codeine Nausea Only    Review of Systems:  neg    Physical Exam:    Ht 5\' 1"  (1.549 m)   Wt 125 lb (56.7 kg)   BMI 23.62 kg/m  Filed Weights   10/18/19 0948  Weight: 125 lb (56.7 kg)  Not examined since it was a televisit   I connected with  Rebecca [name] on 10/18/19 by a video enabled telemedicine  application and verified that I am speaking with the correct person using two identifiers.   I discussed the limitations of evaluation and management by telemedicine. The patient expressed understanding and agreed to proceed.     Carmell Austria, MD 10/18/2019, 11:56 AM  Cc: Townsend Roger, MD

## 2019-10-25 ENCOUNTER — Other Ambulatory Visit: Payer: Self-pay | Admitting: Gastroenterology

## 2019-10-25 DIAGNOSIS — K5909 Other constipation: Secondary | ICD-10-CM

## 2019-10-25 DIAGNOSIS — K219 Gastro-esophageal reflux disease without esophagitis: Secondary | ICD-10-CM

## 2019-10-25 DIAGNOSIS — K581 Irritable bowel syndrome with constipation: Secondary | ICD-10-CM

## 2019-10-25 DIAGNOSIS — R1084 Generalized abdominal pain: Secondary | ICD-10-CM

## 2019-10-25 DIAGNOSIS — R10814 Left lower quadrant abdominal tenderness: Secondary | ICD-10-CM

## 2019-11-14 ENCOUNTER — Telehealth: Payer: Self-pay | Admitting: Gastroenterology

## 2019-11-14 MED ORDER — DEXILANT 30 MG PO CPDR: 30.0000 mg | DELAYED_RELEASE_CAPSULE | Freq: Every day | ORAL | 6 refills | Status: DC

## 2019-11-14 NOTE — Telephone Encounter (Signed)
I have sent prescription to patient's pharmacy.  

## 2019-11-14 NOTE — Telephone Encounter (Signed)
Please change it to Dexilant 30 mg p.o. once a day, 30 with 6 refills Thx  RG

## 2019-11-14 NOTE — Telephone Encounter (Signed)
Would you like to change this patients Dexilant. If so please advise.

## 2019-11-21 ENCOUNTER — Other Ambulatory Visit: Payer: Self-pay

## 2019-11-21 ENCOUNTER — Ambulatory Visit
Admission: RE | Admit: 2019-11-21 | Discharge: 2019-11-21 | Disposition: A | Discharge status: Home / Self Care | Payer: Medicare Other | Source: Ambulatory Visit | Attending: Obstetrics and Gynecology | Admitting: Obstetrics and Gynecology

## 2019-11-21 DIAGNOSIS — Z1231 Encounter for screening mammogram for malignant neoplasm of breast: Secondary | ICD-10-CM

## 2019-12-06 DIAGNOSIS — M419 Scoliosis, unspecified: Secondary | ICD-10-CM | POA: Diagnosis not present

## 2019-12-13 DIAGNOSIS — M40204 Unspecified kyphosis, thoracic region: Secondary | ICD-10-CM | POA: Diagnosis not present

## 2019-12-13 DIAGNOSIS — Z4689 Encounter for fitting and adjustment of other specified devices: Secondary | ICD-10-CM | POA: Diagnosis not present

## 2019-12-13 DIAGNOSIS — M47816 Spondylosis without myelopathy or radiculopathy, lumbar region: Secondary | ICD-10-CM | POA: Diagnosis not present

## 2019-12-13 DIAGNOSIS — M545 Low back pain: Secondary | ICD-10-CM | POA: Diagnosis not present

## 2019-12-13 DIAGNOSIS — M4056 Lordosis, unspecified, lumbar region: Secondary | ICD-10-CM | POA: Diagnosis not present

## 2019-12-24 DIAGNOSIS — S29012A Strain of muscle and tendon of back wall of thorax, initial encounter: Secondary | ICD-10-CM | POA: Diagnosis not present

## 2019-12-24 DIAGNOSIS — M546 Pain in thoracic spine: Secondary | ICD-10-CM | POA: Diagnosis not present

## 2019-12-24 DIAGNOSIS — R531 Weakness: Secondary | ICD-10-CM | POA: Diagnosis not present

## 2019-12-24 DIAGNOSIS — M2560 Stiffness of unspecified joint, not elsewhere classified: Secondary | ICD-10-CM | POA: Diagnosis not present

## 2019-12-24 DIAGNOSIS — M47816 Spondylosis without myelopathy or radiculopathy, lumbar region: Secondary | ICD-10-CM | POA: Diagnosis not present

## 2019-12-24 DIAGNOSIS — M545 Low back pain: Secondary | ICD-10-CM | POA: Diagnosis not present

## 2019-12-26 DIAGNOSIS — M2560 Stiffness of unspecified joint, not elsewhere classified: Secondary | ICD-10-CM | POA: Diagnosis not present

## 2019-12-26 DIAGNOSIS — M545 Low back pain: Secondary | ICD-10-CM | POA: Diagnosis not present

## 2019-12-26 DIAGNOSIS — R531 Weakness: Secondary | ICD-10-CM | POA: Diagnosis not present

## 2019-12-26 DIAGNOSIS — M4005 Postural kyphosis, thoracolumbar region: Secondary | ICD-10-CM | POA: Diagnosis not present

## 2019-12-26 DIAGNOSIS — M47816 Spondylosis without myelopathy or radiculopathy, lumbar region: Secondary | ICD-10-CM | POA: Diagnosis not present

## 2019-12-26 DIAGNOSIS — M546 Pain in thoracic spine: Secondary | ICD-10-CM | POA: Diagnosis not present

## 2019-12-31 DIAGNOSIS — M2560 Stiffness of unspecified joint, not elsewhere classified: Secondary | ICD-10-CM | POA: Diagnosis not present

## 2019-12-31 DIAGNOSIS — R531 Weakness: Secondary | ICD-10-CM | POA: Diagnosis not present

## 2019-12-31 DIAGNOSIS — M4005 Postural kyphosis, thoracolumbar region: Secondary | ICD-10-CM | POA: Diagnosis not present

## 2019-12-31 DIAGNOSIS — M545 Low back pain: Secondary | ICD-10-CM | POA: Diagnosis not present

## 2019-12-31 DIAGNOSIS — M47816 Spondylosis without myelopathy or radiculopathy, lumbar region: Secondary | ICD-10-CM | POA: Diagnosis not present

## 2019-12-31 DIAGNOSIS — M546 Pain in thoracic spine: Secondary | ICD-10-CM | POA: Diagnosis not present

## 2020-01-02 DIAGNOSIS — M545 Low back pain: Secondary | ICD-10-CM | POA: Diagnosis not present

## 2020-01-02 DIAGNOSIS — M47816 Spondylosis without myelopathy or radiculopathy, lumbar region: Secondary | ICD-10-CM | POA: Diagnosis not present

## 2020-01-02 DIAGNOSIS — R531 Weakness: Secondary | ICD-10-CM | POA: Diagnosis not present

## 2020-01-02 DIAGNOSIS — M546 Pain in thoracic spine: Secondary | ICD-10-CM | POA: Diagnosis not present

## 2020-01-02 DIAGNOSIS — M4005 Postural kyphosis, thoracolumbar region: Secondary | ICD-10-CM | POA: Diagnosis not present

## 2020-01-02 DIAGNOSIS — M2560 Stiffness of unspecified joint, not elsewhere classified: Secondary | ICD-10-CM | POA: Diagnosis not present

## 2020-01-07 DIAGNOSIS — M546 Pain in thoracic spine: Secondary | ICD-10-CM | POA: Diagnosis not present

## 2020-01-07 DIAGNOSIS — M545 Low back pain: Secondary | ICD-10-CM | POA: Diagnosis not present

## 2020-01-07 DIAGNOSIS — M2560 Stiffness of unspecified joint, not elsewhere classified: Secondary | ICD-10-CM | POA: Diagnosis not present

## 2020-01-07 DIAGNOSIS — M47816 Spondylosis without myelopathy or radiculopathy, lumbar region: Secondary | ICD-10-CM | POA: Diagnosis not present

## 2020-01-07 DIAGNOSIS — R531 Weakness: Secondary | ICD-10-CM | POA: Diagnosis not present

## 2020-01-07 DIAGNOSIS — M4005 Postural kyphosis, thoracolumbar region: Secondary | ICD-10-CM | POA: Diagnosis not present

## 2020-01-09 DIAGNOSIS — M4005 Postural kyphosis, thoracolumbar region: Secondary | ICD-10-CM | POA: Diagnosis not present

## 2020-01-09 DIAGNOSIS — M2560 Stiffness of unspecified joint, not elsewhere classified: Secondary | ICD-10-CM | POA: Diagnosis not present

## 2020-01-09 DIAGNOSIS — M545 Low back pain: Secondary | ICD-10-CM | POA: Diagnosis not present

## 2020-01-09 DIAGNOSIS — M546 Pain in thoracic spine: Secondary | ICD-10-CM | POA: Diagnosis not present

## 2020-01-09 DIAGNOSIS — M47816 Spondylosis without myelopathy or radiculopathy, lumbar region: Secondary | ICD-10-CM | POA: Diagnosis not present

## 2020-01-09 DIAGNOSIS — R531 Weakness: Secondary | ICD-10-CM | POA: Diagnosis not present

## 2020-01-10 DIAGNOSIS — G43019 Migraine without aura, intractable, without status migrainosus: Secondary | ICD-10-CM | POA: Diagnosis not present

## 2020-01-10 DIAGNOSIS — G43719 Chronic migraine without aura, intractable, without status migrainosus: Secondary | ICD-10-CM | POA: Diagnosis not present

## 2020-01-14 DIAGNOSIS — R531 Weakness: Secondary | ICD-10-CM | POA: Diagnosis not present

## 2020-01-14 DIAGNOSIS — M545 Low back pain: Secondary | ICD-10-CM | POA: Diagnosis not present

## 2020-01-14 DIAGNOSIS — M546 Pain in thoracic spine: Secondary | ICD-10-CM | POA: Diagnosis not present

## 2020-01-14 DIAGNOSIS — M2560 Stiffness of unspecified joint, not elsewhere classified: Secondary | ICD-10-CM | POA: Diagnosis not present

## 2020-01-14 DIAGNOSIS — M47816 Spondylosis without myelopathy or radiculopathy, lumbar region: Secondary | ICD-10-CM | POA: Diagnosis not present

## 2020-01-14 DIAGNOSIS — M4005 Postural kyphosis, thoracolumbar region: Secondary | ICD-10-CM | POA: Diagnosis not present

## 2020-01-16 DIAGNOSIS — R531 Weakness: Secondary | ICD-10-CM | POA: Diagnosis not present

## 2020-01-16 DIAGNOSIS — M4005 Postural kyphosis, thoracolumbar region: Secondary | ICD-10-CM | POA: Diagnosis not present

## 2020-01-16 DIAGNOSIS — M47816 Spondylosis without myelopathy or radiculopathy, lumbar region: Secondary | ICD-10-CM | POA: Diagnosis not present

## 2020-01-16 DIAGNOSIS — M546 Pain in thoracic spine: Secondary | ICD-10-CM | POA: Diagnosis not present

## 2020-01-16 DIAGNOSIS — M2560 Stiffness of unspecified joint, not elsewhere classified: Secondary | ICD-10-CM | POA: Diagnosis not present

## 2020-01-16 DIAGNOSIS — M545 Low back pain: Secondary | ICD-10-CM | POA: Diagnosis not present

## 2020-01-21 DIAGNOSIS — M4005 Postural kyphosis, thoracolumbar region: Secondary | ICD-10-CM | POA: Diagnosis not present

## 2020-01-21 DIAGNOSIS — M47816 Spondylosis without myelopathy or radiculopathy, lumbar region: Secondary | ICD-10-CM | POA: Diagnosis not present

## 2020-01-21 DIAGNOSIS — M546 Pain in thoracic spine: Secondary | ICD-10-CM | POA: Diagnosis not present

## 2020-01-21 DIAGNOSIS — R531 Weakness: Secondary | ICD-10-CM | POA: Diagnosis not present

## 2020-01-21 DIAGNOSIS — M2560 Stiffness of unspecified joint, not elsewhere classified: Secondary | ICD-10-CM | POA: Diagnosis not present

## 2020-01-21 DIAGNOSIS — M545 Low back pain: Secondary | ICD-10-CM | POA: Diagnosis not present

## 2020-01-24 DIAGNOSIS — M40204 Unspecified kyphosis, thoracic region: Secondary | ICD-10-CM | POA: Diagnosis not present

## 2020-01-24 DIAGNOSIS — Z6823 Body mass index (BMI) 23.0-23.9, adult: Secondary | ICD-10-CM | POA: Diagnosis not present

## 2020-01-24 DIAGNOSIS — M47816 Spondylosis without myelopathy or radiculopathy, lumbar region: Secondary | ICD-10-CM | POA: Diagnosis not present

## 2020-01-24 DIAGNOSIS — M4056 Lordosis, unspecified, lumbar region: Secondary | ICD-10-CM | POA: Diagnosis not present

## 2020-02-02 DIAGNOSIS — J340 Abscess, furuncle and carbuncle of nose: Secondary | ICD-10-CM | POA: Diagnosis not present

## 2020-03-25 DIAGNOSIS — R05 Cough: Secondary | ICD-10-CM | POA: Diagnosis not present

## 2020-03-25 DIAGNOSIS — J069 Acute upper respiratory infection, unspecified: Secondary | ICD-10-CM | POA: Diagnosis not present

## 2020-05-13 DIAGNOSIS — Z1272 Encounter for screening for malignant neoplasm of vagina: Secondary | ICD-10-CM | POA: Diagnosis not present

## 2020-07-17 ENCOUNTER — Telehealth: Payer: Self-pay | Admitting: Gastroenterology

## 2020-07-17 MED ORDER — DEXILANT 30 MG PO CPDR: 30.0000 mg | DELAYED_RELEASE_CAPSULE | Freq: Every day | ORAL | 0 refills | Status: DC

## 2020-07-17 NOTE — Telephone Encounter (Signed)
Patient needs to refill Dexilant for 90-day supply. Please send it to [facility].

## 2020-07-17 NOTE — Telephone Encounter (Signed)
I have sent prescription to patient's pharmacy.  

## 2020-08-13 DIAGNOSIS — G4700 Insomnia, unspecified: Secondary | ICD-10-CM | POA: Diagnosis not present

## 2020-08-13 DIAGNOSIS — I10 Essential (primary) hypertension: Secondary | ICD-10-CM | POA: Diagnosis not present

## 2020-08-22 DIAGNOSIS — Z23 Encounter for immunization: Secondary | ICD-10-CM | POA: Diagnosis not present

## 2020-09-02 DIAGNOSIS — I83813 Varicose veins of bilateral lower extremities with pain: Secondary | ICD-10-CM | POA: Diagnosis not present

## 2020-09-02 DIAGNOSIS — G2581 Restless legs syndrome: Secondary | ICD-10-CM | POA: Diagnosis not present

## 2020-09-02 DIAGNOSIS — I8312 Varicose veins of left lower extremity with inflammation: Secondary | ICD-10-CM | POA: Diagnosis not present

## 2020-09-02 DIAGNOSIS — I8311 Varicose veins of right lower extremity with inflammation: Secondary | ICD-10-CM | POA: Diagnosis not present

## 2020-09-05 ENCOUNTER — Ambulatory Visit (INDEPENDENT_AMBULATORY_CARE_PROVIDER_SITE_OTHER): Payer: Medicare Other | Admitting: Gastroenterology

## 2020-09-05 ENCOUNTER — Other Ambulatory Visit (INDEPENDENT_AMBULATORY_CARE_PROVIDER_SITE_OTHER): Payer: Medicare Other

## 2020-09-05 ENCOUNTER — Encounter: Payer: Self-pay | Admitting: Gastroenterology

## 2020-09-05 VITALS — BP 114/62 | HR 58 | Ht 61.0 in | Wt 122.2 lb

## 2020-09-05 DIAGNOSIS — R1013 Epigastric pain: Secondary | ICD-10-CM

## 2020-09-05 DIAGNOSIS — K581 Irritable bowel syndrome with constipation: Secondary | ICD-10-CM | POA: Diagnosis not present

## 2020-09-05 LAB — CBC WITH DIFFERENTIAL/PLATELET
Basophils Absolute: 0 10*3/uL (ref 0.0–0.1)
Basophils Relative: 0.5 % (ref 0.0–3.0)
Eosinophils Absolute: 0.3 10*3/uL (ref 0.0–0.7)
Eosinophils Relative: 6.3 % — ABNORMAL HIGH (ref 0.0–5.0)
HCT: 38.2 % (ref 36.0–46.0)
Hemoglobin: 12.7 g/dL (ref 12.0–15.0)
Lymphocytes Relative: 26 % (ref 12.0–46.0)
Lymphs Abs: 1.3 10*3/uL (ref 0.7–4.0)
MCHC: 33.2 g/dL (ref 30.0–36.0)
MCV: 94 fl (ref 78.0–100.0)
Monocytes Absolute: 0.4 10*3/uL (ref 0.1–1.0)
Monocytes Relative: 7.9 % (ref 3.0–12.0)
Neutro Abs: 2.9 10*3/uL (ref 1.4–7.7)
Neutrophils Relative %: 59.3 % (ref 43.0–77.0)
Platelets: 154 10*3/uL (ref 150.0–400.0)
RBC: 4.06 Mil/uL (ref 3.87–5.11)
RDW: 12.2 % (ref 11.5–15.5)
WBC: 4.9 10*3/uL (ref 4.0–10.5)

## 2020-09-05 LAB — COMPREHENSIVE METABOLIC PANEL
ALT: 42 U/L — ABNORMAL HIGH (ref 0–35)
AST: 28 U/L (ref 0–37)
Albumin: 4.4 g/dL (ref 3.5–5.2)
Alkaline Phosphatase: 97 U/L (ref 39–117)
BUN: 16 mg/dL (ref 6–23)
CO2: 29 mEq/L (ref 19–32)
Calcium: 9.3 mg/dL (ref 8.4–10.5)
Chloride: 105 mEq/L (ref 96–112)
Creatinine, Ser: 0.92 mg/dL (ref 0.40–1.20)
GFR: 62.39 mL/min (ref >60.00)
Glucose, Bld: 91 mg/dL (ref 70–99)
Potassium: 4.3 mEq/L (ref 3.5–5.1)
Sodium: 140 mEq/L (ref 135–145)
Total Bilirubin: 0.3 mg/dL (ref 0.2–1.2)
Total Protein: 6.5 g/dL (ref 6.0–8.3)

## 2020-09-05 LAB — LIPASE: Lipase: 36 U/L (ref 11.0–59.0)

## 2020-09-05 NOTE — Progress Notes (Signed)
Chief Complaint: FU  Referring Provider:  Townsend Roger, MD      ASSESSMENT AND PLAN;   #1. Abdo pain with tenderness. R rib pain -likely musculoskeletal  #2. H/O tubular adenomas.  Next colon due 07/2026   #3. IBS-C (H/O diarrhea in the past). Neg colon 07/2019 except for mild sigmoid diverticulosis, small tubular adenomas.   #4. GERD with small HH. EGD 07/2019 with small HH, gastric fundic gland polyps. Neg SB Bx for celiac. Neg CT AP 10/2018.  #5. FH metastatic pancreatic cancer (sister).  Plan: - CBC, CMP, lipase - CT AP with IV and PO contrast - Heating pads prn.   - Colace 1 tab po qd on weekdays. Not over the weekends. - Dexilant 60mg  po qod    HPI:    Rebecca [name] is a 74 y.o. female  Goes to same church as Anderson Malta For follow-up visit  Has been having upper abdominal pain with some tenderness.  Worse over the last 2 to 3 months.  Denies having any heartburn or regurgitation as long as she is on Dexilant.  No further weight loss.  Very much worried about pancreatic CA d/t strong family history.   Neg CT scan of the abdomen/pelvis February 2019. Neg EGD/colonoscopy for any acute abnormalities.  Report as above. Had negative urology work-up.  If she takes Colace 3 times per week, she would still be constipated.  When she increases it to every day she gets diarrhea.  Denies having any abdominal pain.  No fever chills night sweats.  Denies having any upper GI symptoms as long as she takes Dexilant.  She has not reduced it to every other day but is willing to try.  Had right rib pain-exacerbated by walking.  Thinks it is musculoskeletal.  Additional GI history: -Sister had metastatic pancreatic cancer -History of abnormal liver function tests due to fatty liver diagnosed 2002, had liver biopsy 12/2004.  Negative autoimmune hepatitis profile, acute hepatitis profile, ANA Past Medical History:  Diagnosis Date  . Anxiety disorder   . Blood transfusion    Blood  bank notified Ashley Akin)  . Blood transfusion without reported diagnosis 1984 x7   [facility]  . Complication of anesthesia 2006   Drop in  Bp intra-op with Lap Chole. [employer].  . Depression   . GERD (gastroesophageal reflux disease)   . Headache(784.0)    migraines  . History of colon polyps   . IBS (irritable bowel syndrome)   . Internal hemorrhoids without complication     Past Surgical History:  Procedure Laterality Date  . ABDOMINAL HYSTERECTOMY  1984  . APPENDECTOMY  1967  . AUGMENTATION MAMMAPLASTY Bilateral   . BLADDER SUSPENSION  06/23/2011   Procedure: TRANSVAGINAL TAPE (TVT) PROCEDURE;  Surgeon: Daria Pastures;  Location: [facility];  Service: Gynecology;  Laterality: N/A;  . BREAST SURGERY     implants due to removal of breast tissue  . CATARACT EXTRACTION     Inserted prosthetic lens  . CHOLECYSTECTOMY     due to biliary dyskinesia  . COLONOSCOPY  10/11/2016   Minimal sigmoid diverticulosis. Otherwise normal colonsocopy.   . CYSTOCELE REPAIR  06/23/2011   Procedure: ANTERIOR REPAIR (CYSTOCELE);  Surgeon: Daria Pastures;  Location: [facility];  Service: Gynecology;  Laterality: N/A;  . CYSTOSCOPY  06/23/2011   Procedure: CYSTOSCOPY;  Surgeon: Daria Pastures;  Location: [facility];  Service: Gynecology;  Laterality: N/A;  . DNS s/p nasal surgery    .  ESOPHAGOGASTRODUODENOSCOPY  03/03/2016   Small hiatal hernia. Incidental gastric polyps. Mild gastritis.   . EYE MUSCLE SURGERY  as child  . HERNIA REPAIR  12/30/2010   inguinal. performed by Dr Noberto Retort   . KNEE ARTHROPLASTY  2006  . MASTECTOMY Bilateral    pt states that 95% of tissue removed  . OVARIAN CYST REMOVAL     bilateral  . RECTOCELE REPAIR  11/10/2011   Procedure: POSTERIOR REPAIR (RECTOCELE);  Surgeon: Daria Pastures, MD;  Location: [facility];  Service: Gynecology;  Laterality: N/A;  . RECTOCELE REPAIR N/A 07/03/2014   Procedure: revision of POSTERIOR REPAIR (RECTOCELE) with bladder instillation ;   Surgeon: Daria Pastures, MD;  Location: [facility];  Service: Gynecology;  Laterality: N/A;  . RECTOCELE REPAIR N/A 07/03/2014   Procedure: POSTERIOR REPAIR (RECTOCELE) with suture repair for hemostasis ;  Surgeon: Daria Pastures, MD;  Location: [facility];  Service: Gynecology;  Laterality: N/A;  . ROTATOR CUFF REPAIR Left   . SHOULDER SURGERY Right 06/2019    Family History  Problem Relation Age of Onset  . Breast cancer Mother   . Pancreatic cancer Sister        metastatic  . Colon cancer Neg Hx   . Esophageal cancer Neg Hx   . Rectal cancer Neg Hx   . Stomach cancer Neg Hx     Social History   Tobacco Use  . Smoking status: Former Research scientist (life sciences)  . Smokeless tobacco: Never Used  . Tobacco comment: quit 1984  Vaping Use  . Vaping Use: Never used  Substance Use Topics  . Alcohol use: No  . Drug use: No    Current Outpatient Medications  Medication Sig Dispense Refill  . cyclobenzaprine (FLEXERIL) 10 MG tablet Take 10 mg by mouth 3 (three) times daily as needed for muscle spasms.    . Dexlansoprazole (DEXILANT) 30 MG capsule Take 1 capsule (30 mg total) by mouth daily. 90 capsule 0  . Erenumab-aooe (AIMOVIG [place]) Inject into the skin every 30 (thirty) days.    Marland Kitchen lactose free nutrition (BOOST) LIQD Take 237 mLs by mouth 2 (two) times daily between meals.    . metoprolol tartrate (LOPRESSOR) 25 MG tablet Take 3 tablets by mouth 2 (two) times daily.    . pravastatin (PRAVACHOL) 40 MG tablet Take 40 mg by mouth at bedtime.    . traZODone (DESYREL) 50 MG tablet Take 50 mg by mouth at bedtime.     Current Facility-Administered Medications  Medication Dose Route Frequency Provider Last Rate Last Admin  . 0.9 %  sodium chloride infusion  500 mL Intravenous Once Jackquline Denmark, MD        Allergies  Allergen Reactions  . Ciprofloxacin   . Codeine Nausea Only    Review of Systems:  neg    Physical Exam:    BP 114/62   Pulse (!) 58   Ht 5\' 1"  (1.549 m)   Wt 122 lb 4 oz (55.5 kg)    BMI 23.10 kg/m  Filed Weights   09/05/20 1132  Weight: 122 lb 4 oz (55.5 kg)  Gen: awake, alert, NAD HEENT: anicteric, no pallor CV: RRR, no mrg Pulm: CTA b/l Abd: soft, epigastric and right lower quadrant abdominal tenderness.  No definite masses.  +BS throughout Ext: no c/c/e Neuro: nonfocal      Carmell Austria, MD 09/05/2020, 11:47 AM  Cc: Townsend Roger, MD

## 2020-09-05 NOTE — Patient Instructions (Signed)
If you are age 72 or older, your body mass index should be between 23-30. Your Body mass index is 23.1 kg/m. If this is out of the aforementioned range listed, please consider follow up with your Primary Care Provider.  If you are age 72 or younger, your body mass index should be between 19-25. Your Body mass index is 23.1 kg/m. If this is out of the aformentioned range listed, please consider follow up with your Primary Care Provider.    Your provider has requested that you have lab work today. We ask that you go to our [facility] office at [address], [place], [place] [address]. Please enter through the main entrance and go to the elevator.  Press "B" on the elevator. The lab is located at the first door on the left as you exit the elevator.   You are scheduled on 09/12/2020 at 11:00am. You should arrive 15 minutes prior to your appointment time for registration. Please follow the written instructions below on the day of your exam:  WARNING: IF YOU ARE ALLERGIC TO IODINE/X-RAY DYE, PLEASE NOTIFY RADIOLOGY IMMEDIATELY AT [phone]! YOU WILL BE GIVEN A 13 HOUR PREMEDICATION PREP.  1) Do not eat or drink anything after 7:00am (4 hours prior to your test) 2) You have been given 2 bottles of oral contrast to drink. The solution may taste better if refrigerated, but do NOT add ice or any other liquid to this solution. Shake well before drinking.    Drink 1 bottle of contrast @ 10:00am (2 hours prior to your exam)  Drink 1 bottle of contrast @ 9:00am(1 hour prior to your exam)  You may take any medications as prescribed with a small amount of water, if necessary. If you take any of the following medications: METFORMIN, GLUCOPHAGE, GLUCOVANCE, AVANDAMET, RIOMET, FORTAMET, [place] MET, JANUMET, GLUMETZA or METAGLIP, you MAY be asked to HOLD this medication 48 hours AFTER the exam.  The purpose of you drinking the oral contrast is to aid in the visualization of your intestinal tract. The  contrast solution may cause some diarrhea. Depending on your individual set of symptoms, you may also receive an intravenous injection of x-ray contrast/dye. Plan on being at [facility] for 30 minutes or longer, depending on the type of exam you are having performed.  This test typically takes 30-45 minutes to complete.   Due to recent changes in healthcare laws, you may see the results of your imaging and laboratory studies on MyChart before your provider has had a chance to review them.  We understand that in some cases there may be results that are confusing or concerning to you. Not all laboratory results come back in the same time frame and the provider may be waiting for multiple results in order to interpret others.  Please give [place] 48 hours in order for your provider to thoroughly review all the results before contacting the office for clarification of your results.

## 2020-09-08 ENCOUNTER — Encounter: Payer: Self-pay | Admitting: Gastroenterology

## 2020-09-12 ENCOUNTER — Ambulatory Visit (HOSPITAL_BASED_OUTPATIENT_CLINIC_OR_DEPARTMENT_OTHER)
Admission: RE | Admit: 2020-09-12 | Discharge: 2020-09-12 | Disposition: A | Discharge status: Home / Self Care | Payer: Medicare Other | Source: Ambulatory Visit | Attending: Gastroenterology | Admitting: Gastroenterology

## 2020-09-12 ENCOUNTER — Other Ambulatory Visit: Payer: Self-pay

## 2020-09-12 DIAGNOSIS — R1013 Epigastric pain: Secondary | ICD-10-CM | POA: Diagnosis not present

## 2020-09-12 DIAGNOSIS — R109 Unspecified abdominal pain: Secondary | ICD-10-CM | POA: Diagnosis not present

## 2020-09-12 MED ORDER — IOHEXOL 300 MG/ML  SOLN
100.0000 mL | Freq: Once | INTRAMUSCULAR | Status: AC | PRN
  Administered 2020-09-12: 100 mL via INTRAVENOUS

## 2020-10-09 DIAGNOSIS — I8311 Varicose veins of right lower extremity with inflammation: Secondary | ICD-10-CM | POA: Diagnosis not present

## 2020-10-09 DIAGNOSIS — I8312 Varicose veins of left lower extremity with inflammation: Secondary | ICD-10-CM | POA: Diagnosis not present

## 2020-10-13 DIAGNOSIS — I83893 Varicose veins of bilateral lower extremities with other complications: Secondary | ICD-10-CM | POA: Diagnosis not present

## 2020-10-13 DIAGNOSIS — I83813 Varicose veins of bilateral lower extremities with pain: Secondary | ICD-10-CM | POA: Diagnosis not present

## 2020-10-14 ENCOUNTER — Other Ambulatory Visit: Payer: Self-pay | Admitting: Obstetrics and Gynecology

## 2020-10-14 DIAGNOSIS — Z1231 Encounter for screening mammogram for malignant neoplasm of breast: Secondary | ICD-10-CM

## 2020-10-23 DIAGNOSIS — G43719 Chronic migraine without aura, intractable, without status migrainosus: Secondary | ICD-10-CM | POA: Diagnosis not present

## 2020-10-23 DIAGNOSIS — G43019 Migraine without aura, intractable, without status migrainosus: Secondary | ICD-10-CM | POA: Diagnosis not present

## 2020-10-28 DIAGNOSIS — M549 Dorsalgia, unspecified: Secondary | ICD-10-CM | POA: Diagnosis not present

## 2020-10-28 DIAGNOSIS — R0902 Hypoxemia: Secondary | ICD-10-CM | POA: Diagnosis not present

## 2020-10-28 DIAGNOSIS — W19XXXA Unspecified fall, initial encounter: Secondary | ICD-10-CM | POA: Diagnosis not present

## 2020-10-28 DIAGNOSIS — S22050A Wedge compression fracture of T5-T6 vertebra, initial encounter for closed fracture: Secondary | ICD-10-CM | POA: Diagnosis not present

## 2020-10-28 DIAGNOSIS — S0990XA Unspecified injury of head, initial encounter: Secondary | ICD-10-CM | POA: Diagnosis not present

## 2020-10-28 DIAGNOSIS — Y9301 Activity, walking, marching and hiking: Secondary | ICD-10-CM | POA: Diagnosis not present

## 2020-10-28 DIAGNOSIS — J9811 Atelectasis: Secondary | ICD-10-CM | POA: Diagnosis not present

## 2020-10-28 DIAGNOSIS — Y998 Other external cause status: Secondary | ICD-10-CM | POA: Diagnosis not present

## 2020-10-28 DIAGNOSIS — R911 Solitary pulmonary nodule: Secondary | ICD-10-CM | POA: Diagnosis not present

## 2020-10-28 DIAGNOSIS — M898X1 Other specified disorders of bone, shoulder: Secondary | ICD-10-CM | POA: Diagnosis not present

## 2020-10-28 DIAGNOSIS — I700 Atherosclerosis of aorta: Secondary | ICD-10-CM | POA: Diagnosis not present

## 2020-10-28 DIAGNOSIS — R52 Pain, unspecified: Secondary | ICD-10-CM | POA: Diagnosis not present

## 2020-10-28 DIAGNOSIS — W1839XA Other fall on same level, initial encounter: Secondary | ICD-10-CM | POA: Diagnosis not present

## 2020-10-28 DIAGNOSIS — M40204 Unspecified kyphosis, thoracic region: Secondary | ICD-10-CM | POA: Diagnosis not present

## 2020-10-30 DIAGNOSIS — M40204 Unspecified kyphosis, thoracic region: Secondary | ICD-10-CM | POA: Diagnosis not present

## 2020-10-30 DIAGNOSIS — M4056 Lordosis, unspecified, lumbar region: Secondary | ICD-10-CM | POA: Diagnosis not present

## 2020-10-30 DIAGNOSIS — S22050A Wedge compression fracture of T5-T6 vertebra, initial encounter for closed fracture: Secondary | ICD-10-CM | POA: Diagnosis not present

## 2020-10-30 DIAGNOSIS — M47816 Spondylosis without myelopathy or radiculopathy, lumbar region: Secondary | ICD-10-CM | POA: Diagnosis not present

## 2020-11-05 DIAGNOSIS — S22050A Wedge compression fracture of T5-T6 vertebra, initial encounter for closed fracture: Secondary | ICD-10-CM | POA: Diagnosis not present

## 2020-11-05 DIAGNOSIS — M40204 Unspecified kyphosis, thoracic region: Secondary | ICD-10-CM | POA: Diagnosis not present

## 2020-11-05 DIAGNOSIS — M4056 Lordosis, unspecified, lumbar region: Secondary | ICD-10-CM | POA: Diagnosis not present

## 2020-11-10 ENCOUNTER — Telehealth: Payer: Self-pay | Admitting: Gastroenterology

## 2020-11-10 MED ORDER — DEXILANT 30 MG PO CPDR
30.0000 mg | DELAYED_RELEASE_CAPSULE | Freq: Every day | ORAL | 0 refills | Status: DC
Start: 2020-11-10 — End: 2021-02-12

## 2020-11-10 NOTE — Telephone Encounter (Signed)
Pt is requesting a refill on her dexilant, [facility]

## 2020-12-03 ENCOUNTER — Ambulatory Visit: Payer: Medicare Other

## 2020-12-04 DIAGNOSIS — Z6823 Body mass index (BMI) 23.0-23.9, adult: Secondary | ICD-10-CM | POA: Diagnosis not present

## 2020-12-04 DIAGNOSIS — S22050S Wedge compression fracture of T5-T6 vertebra, sequela: Secondary | ICD-10-CM | POA: Diagnosis not present

## 2020-12-04 DIAGNOSIS — M4056 Lordosis, unspecified, lumbar region: Secondary | ICD-10-CM | POA: Diagnosis not present

## 2020-12-04 DIAGNOSIS — M40204 Unspecified kyphosis, thoracic region: Secondary | ICD-10-CM | POA: Diagnosis not present

## 2020-12-12 DIAGNOSIS — S22050S Wedge compression fracture of T5-T6 vertebra, sequela: Secondary | ICD-10-CM | POA: Diagnosis not present

## 2020-12-12 DIAGNOSIS — M8008XA Age-related osteoporosis with current pathological fracture, vertebra(e), initial encounter for fracture: Secondary | ICD-10-CM | POA: Diagnosis not present

## 2020-12-26 DIAGNOSIS — S22009D Unspecified fracture of unspecified thoracic vertebra, subsequent encounter for fracture with routine healing: Secondary | ICD-10-CM | POA: Diagnosis not present

## 2020-12-26 DIAGNOSIS — I10 Essential (primary) hypertension: Secondary | ICD-10-CM | POA: Diagnosis not present

## 2021-01-02 DIAGNOSIS — M40204 Unspecified kyphosis, thoracic region: Secondary | ICD-10-CM | POA: Diagnosis not present

## 2021-01-02 DIAGNOSIS — Z79891 Long term (current) use of opiate analgesic: Secondary | ICD-10-CM | POA: Diagnosis not present

## 2021-01-02 DIAGNOSIS — M5414 Radiculopathy, thoracic region: Secondary | ICD-10-CM | POA: Diagnosis not present

## 2021-01-02 DIAGNOSIS — Z79899 Other long term (current) drug therapy: Secondary | ICD-10-CM | POA: Diagnosis not present

## 2021-01-02 DIAGNOSIS — M47894 Other spondylosis, thoracic region: Secondary | ICD-10-CM | POA: Diagnosis not present

## 2021-01-02 DIAGNOSIS — G894 Chronic pain syndrome: Secondary | ICD-10-CM | POA: Diagnosis not present

## 2021-01-02 DIAGNOSIS — S22050S Wedge compression fracture of T5-T6 vertebra, sequela: Secondary | ICD-10-CM | POA: Diagnosis not present

## 2021-01-02 DIAGNOSIS — S22050A Wedge compression fracture of T5-T6 vertebra, initial encounter for closed fracture: Secondary | ICD-10-CM | POA: Diagnosis not present

## 2021-01-07 ENCOUNTER — Other Ambulatory Visit: Payer: Self-pay | Admitting: Orthopaedic Surgery

## 2021-01-07 DIAGNOSIS — M40204 Unspecified kyphosis, thoracic region: Secondary | ICD-10-CM

## 2021-01-29 ENCOUNTER — Ambulatory Visit
Admission: RE | Admit: 2021-01-29 | Discharge: 2021-01-29 | Disposition: A | Discharge status: Home / Self Care | Payer: Medicare Other | Source: Ambulatory Visit | Attending: Orthopaedic Surgery | Admitting: Orthopaedic Surgery

## 2021-01-29 DIAGNOSIS — S22050A Wedge compression fracture of T5-T6 vertebra, initial encounter for closed fracture: Secondary | ICD-10-CM | POA: Diagnosis not present

## 2021-01-29 DIAGNOSIS — M47814 Spondylosis without myelopathy or radiculopathy, thoracic region: Secondary | ICD-10-CM | POA: Diagnosis not present

## 2021-01-29 DIAGNOSIS — M40204 Unspecified kyphosis, thoracic region: Secondary | ICD-10-CM

## 2021-02-02 DIAGNOSIS — S22050S Wedge compression fracture of T5-T6 vertebra, sequela: Secondary | ICD-10-CM | POA: Diagnosis not present

## 2021-02-02 DIAGNOSIS — M40204 Unspecified kyphosis, thoracic region: Secondary | ICD-10-CM | POA: Diagnosis not present

## 2021-02-02 DIAGNOSIS — M5414 Radiculopathy, thoracic region: Secondary | ICD-10-CM | POA: Diagnosis not present

## 2021-02-02 DIAGNOSIS — M791 Myalgia, unspecified site: Secondary | ICD-10-CM | POA: Diagnosis not present

## 2021-02-02 DIAGNOSIS — Z6823 Body mass index (BMI) 23.0-23.9, adult: Secondary | ICD-10-CM | POA: Diagnosis not present

## 2021-02-11 DIAGNOSIS — M8580 Other specified disorders of bone density and structure, unspecified site: Secondary | ICD-10-CM | POA: Diagnosis not present

## 2021-02-11 DIAGNOSIS — R0781 Pleurodynia: Secondary | ICD-10-CM | POA: Diagnosis not present

## 2021-02-11 DIAGNOSIS — R634 Abnormal weight loss: Secondary | ICD-10-CM | POA: Diagnosis not present

## 2021-02-12 ENCOUNTER — Telehealth: Payer: Self-pay | Admitting: Gastroenterology

## 2021-02-12 MED ORDER — DEXLANSOPRAZOLE 30 MG PO CPDR: 30.0000 mg | DELAYED_RELEASE_CAPSULE | Freq: Every day | ORAL | 3 refills | Status: DC

## 2021-02-12 NOTE — Telephone Encounter (Signed)
Dexilant 30mg  sent and patient is aware

## 2021-02-12 NOTE — Telephone Encounter (Signed)
Patient requesting refill on Dexilant

## 2021-03-03 DIAGNOSIS — Z6823 Body mass index (BMI) 23.0-23.9, adult: Secondary | ICD-10-CM | POA: Diagnosis not present

## 2021-03-03 DIAGNOSIS — M546 Pain in thoracic spine: Secondary | ICD-10-CM | POA: Diagnosis not present

## 2021-03-03 DIAGNOSIS — S22050S Wedge compression fracture of T5-T6 vertebra, sequela: Secondary | ICD-10-CM | POA: Diagnosis not present

## 2021-03-03 DIAGNOSIS — M40204 Unspecified kyphosis, thoracic region: Secondary | ICD-10-CM | POA: Diagnosis not present

## 2021-03-09 DIAGNOSIS — M5414 Radiculopathy, thoracic region: Secondary | ICD-10-CM | POA: Diagnosis not present

## 2021-03-10 DIAGNOSIS — R300 Dysuria: Secondary | ICD-10-CM | POA: Diagnosis not present

## 2021-03-10 DIAGNOSIS — R319 Hematuria, unspecified: Secondary | ICD-10-CM | POA: Diagnosis not present

## 2021-04-01 DIAGNOSIS — M5414 Radiculopathy, thoracic region: Secondary | ICD-10-CM | POA: Diagnosis not present

## 2021-04-01 DIAGNOSIS — M40204 Unspecified kyphosis, thoracic region: Secondary | ICD-10-CM | POA: Diagnosis not present

## 2021-04-03 DIAGNOSIS — D225 Melanocytic nevi of trunk: Secondary | ICD-10-CM | POA: Diagnosis not present

## 2021-04-03 DIAGNOSIS — L820 Inflamed seborrheic keratosis: Secondary | ICD-10-CM | POA: Diagnosis not present

## 2021-04-03 DIAGNOSIS — D485 Neoplasm of uncertain behavior of skin: Secondary | ICD-10-CM | POA: Diagnosis not present

## 2021-04-14 DIAGNOSIS — M47814 Spondylosis without myelopathy or radiculopathy, thoracic region: Secondary | ICD-10-CM | POA: Diagnosis not present

## 2021-04-22 ENCOUNTER — Ambulatory Visit: Payer: Medicare Other

## 2021-04-22 DIAGNOSIS — B001 Herpesviral vesicular dermatitis: Secondary | ICD-10-CM | POA: Diagnosis not present

## 2021-04-22 DIAGNOSIS — J029 Acute pharyngitis, unspecified: Secondary | ICD-10-CM | POA: Diagnosis not present

## 2021-04-27 DIAGNOSIS — M47814 Spondylosis without myelopathy or radiculopathy, thoracic region: Secondary | ICD-10-CM | POA: Diagnosis not present

## 2021-04-30 DIAGNOSIS — G43719 Chronic migraine without aura, intractable, without status migrainosus: Secondary | ICD-10-CM | POA: Diagnosis not present

## 2021-04-30 DIAGNOSIS — G43019 Migraine without aura, intractable, without status migrainosus: Secondary | ICD-10-CM | POA: Diagnosis not present

## 2021-05-18 DIAGNOSIS — Z0000 Encounter for general adult medical examination without abnormal findings: Secondary | ICD-10-CM | POA: Diagnosis not present

## 2021-05-18 DIAGNOSIS — E7800 Pure hypercholesterolemia, unspecified: Secondary | ICD-10-CM | POA: Diagnosis not present

## 2021-05-18 DIAGNOSIS — M810 Age-related osteoporosis without current pathological fracture: Secondary | ICD-10-CM | POA: Diagnosis not present

## 2021-05-18 DIAGNOSIS — Z1331 Encounter for screening for depression: Secondary | ICD-10-CM | POA: Diagnosis not present

## 2021-05-18 DIAGNOSIS — Z6821 Body mass index (BMI) 21.0-21.9, adult: Secondary | ICD-10-CM | POA: Diagnosis not present

## 2021-05-18 DIAGNOSIS — S22009A Unspecified fracture of unspecified thoracic vertebra, initial encounter for closed fracture: Secondary | ICD-10-CM | POA: Diagnosis not present

## 2021-05-18 DIAGNOSIS — I10 Essential (primary) hypertension: Secondary | ICD-10-CM | POA: Diagnosis not present

## 2021-05-18 DIAGNOSIS — G4700 Insomnia, unspecified: Secondary | ICD-10-CM | POA: Diagnosis not present

## 2021-05-18 DIAGNOSIS — K589 Irritable bowel syndrome without diarrhea: Secondary | ICD-10-CM | POA: Diagnosis not present

## 2021-05-18 DIAGNOSIS — G43009 Migraine without aura, not intractable, without status migrainosus: Secondary | ICD-10-CM | POA: Diagnosis not present

## 2021-05-18 DIAGNOSIS — K219 Gastro-esophageal reflux disease without esophagitis: Secondary | ICD-10-CM | POA: Diagnosis not present

## 2021-05-21 DIAGNOSIS — D696 Thrombocytopenia, unspecified: Secondary | ICD-10-CM | POA: Insufficient documentation

## 2021-05-26 DIAGNOSIS — M47814 Spondylosis without myelopathy or radiculopathy, thoracic region: Secondary | ICD-10-CM | POA: Diagnosis not present

## 2021-06-22 ENCOUNTER — Ambulatory Visit
Admission: RE | Admit: 2021-06-22 | Discharge: 2021-06-22 | Disposition: A | Discharge status: Home / Self Care | Payer: Medicare Other | Source: Ambulatory Visit | Attending: Obstetrics and Gynecology | Admitting: Obstetrics and Gynecology

## 2021-06-22 ENCOUNTER — Other Ambulatory Visit: Payer: Self-pay

## 2021-06-22 ENCOUNTER — Other Ambulatory Visit: Payer: Self-pay | Admitting: Obstetrics and Gynecology

## 2021-06-22 DIAGNOSIS — Z1231 Encounter for screening mammogram for malignant neoplasm of breast: Secondary | ICD-10-CM

## 2021-06-23 DIAGNOSIS — M47814 Spondylosis without myelopathy or radiculopathy, thoracic region: Secondary | ICD-10-CM | POA: Diagnosis not present

## 2021-06-24 DIAGNOSIS — Z20822 Contact with and (suspected) exposure to covid-19: Secondary | ICD-10-CM | POA: Diagnosis not present

## 2021-08-19 DIAGNOSIS — D696 Thrombocytopenia, unspecified: Secondary | ICD-10-CM | POA: Diagnosis not present

## 2021-08-19 DIAGNOSIS — G894 Chronic pain syndrome: Secondary | ICD-10-CM | POA: Diagnosis not present

## 2021-08-19 DIAGNOSIS — Z23 Encounter for immunization: Secondary | ICD-10-CM | POA: Diagnosis not present

## 2021-08-19 DIAGNOSIS — I10 Essential (primary) hypertension: Secondary | ICD-10-CM | POA: Diagnosis not present

## 2021-08-26 DIAGNOSIS — D72819 Decreased white blood cell count, unspecified: Secondary | ICD-10-CM | POA: Insufficient documentation

## 2021-10-12 ENCOUNTER — Encounter: Payer: Self-pay | Admitting: Gastroenterology

## 2021-10-12 ENCOUNTER — Other Ambulatory Visit: Payer: Self-pay

## 2021-10-12 ENCOUNTER — Ambulatory Visit (INDEPENDENT_AMBULATORY_CARE_PROVIDER_SITE_OTHER): Payer: Medicare Other | Admitting: Gastroenterology

## 2021-10-12 ENCOUNTER — Other Ambulatory Visit (INDEPENDENT_AMBULATORY_CARE_PROVIDER_SITE_OTHER): Payer: Medicare Other

## 2021-10-12 VITALS — BP 114/80 | HR 61 | Ht 60.0 in | Wt 112.5 lb

## 2021-10-12 DIAGNOSIS — Z800 Family history of malignant neoplasm of digestive organs: Secondary | ICD-10-CM

## 2021-10-12 DIAGNOSIS — K449 Diaphragmatic hernia without obstruction or gangrene: Secondary | ICD-10-CM

## 2021-10-12 DIAGNOSIS — K581 Irritable bowel syndrome with constipation: Secondary | ICD-10-CM

## 2021-10-12 DIAGNOSIS — R1319 Other dysphagia: Secondary | ICD-10-CM | POA: Diagnosis not present

## 2021-10-12 DIAGNOSIS — K219 Gastro-esophageal reflux disease without esophagitis: Secondary | ICD-10-CM | POA: Diagnosis not present

## 2021-10-12 DIAGNOSIS — R1013 Epigastric pain: Secondary | ICD-10-CM

## 2021-10-12 LAB — COMPREHENSIVE METABOLIC PANEL
ALT: 28 U/L (ref 0–35)
AST: 26 U/L (ref 0–37)
Albumin: 4.4 g/dL (ref 3.5–5.2)
Alkaline Phosphatase: 94 U/L (ref 39–117)
BUN: 22 mg/dL (ref 6–23)
CO2: 30 mEq/L (ref 19–32)
Calcium: 9.6 mg/dL (ref 8.4–10.5)
Chloride: 103 mEq/L (ref 96–112)
Creatinine, Ser: 0.89 mg/dL (ref 0.40–1.20)
GFR: 64.42 mL/min (ref >60.00)
Glucose, Bld: 70 mg/dL (ref 70–99)
Potassium: 4.2 mEq/L (ref 3.5–5.1)
Sodium: 140 mEq/L (ref 135–145)
Total Bilirubin: 0.6 mg/dL (ref 0.2–1.2)
Total Protein: 6.4 g/dL (ref 6.0–8.3)

## 2021-10-12 LAB — CBC WITH DIFFERENTIAL/PLATELET
Basophils Absolute: 0 10*3/uL (ref 0.0–0.1)
Basophils Relative: 0.6 % (ref 0.0–3.0)
Eosinophils Absolute: 0.1 10*3/uL (ref 0.0–0.7)
Eosinophils Relative: 3 % (ref 0.0–5.0)
HCT: 38.9 % (ref 36.0–46.0)
Hemoglobin: 13 g/dL (ref 12.0–15.0)
Lymphocytes Relative: 28 % (ref 12.0–46.0)
Lymphs Abs: 1 10*3/uL (ref 0.7–4.0)
MCHC: 33.4 g/dL (ref 30.0–36.0)
MCV: 94.1 fl (ref 78.0–100.0)
Monocytes Absolute: 0.3 10*3/uL (ref 0.1–1.0)
Monocytes Relative: 9.1 % (ref 3.0–12.0)
Neutro Abs: 2.2 10*3/uL (ref 1.4–7.7)
Neutrophils Relative %: 59.3 % (ref 43.0–77.0)
Platelets: 137 10*3/uL — ABNORMAL LOW (ref 150.0–400.0)
RBC: 4.14 Mil/uL (ref 3.87–5.11)
RDW: 12.3 % (ref 11.5–15.5)
WBC: 3.6 10*3/uL — ABNORMAL LOW (ref 4.0–10.5)

## 2021-10-12 LAB — LIPASE: Lipase: 24 U/L (ref 11.0–59.0)

## 2021-10-12 MED ORDER — DEXLANSOPRAZOLE 30 MG PO CPDR: 30.0000 mg | DELAYED_RELEASE_CAPSULE | Freq: Every day | ORAL | 3 refills | Status: DC

## 2021-10-12 NOTE — Patient Instructions (Addendum)
If you are age 73 or older, your body mass index should be between 23-30. Your Body mass index is 21.97 kg/m. If this is out of the aforementioned range listed, please consider follow up with your Primary Care Provider.  If you are age 42 or younger, your body mass index should be between 19-25. Your Body mass index is 21.97 kg/m. If this is out of the aformentioned range listed, please consider follow up with your Primary Care Provider.   ________________________________________________________  The [facility] providers would like to encourage you to use [facility] to communicate with providers for non-urgent requests or questions.  Due to long hold times on the telephone, sending your provider a message by [facility] may be a faster and more efficient way to get a response.  Please allow 48 business hours for a response.  Please remember that this is for non-urgent requests.  _______________________________________________________  Please go to the lab on the 2nd floor [address] before you leave the office today.   You have been scheduled for an endoscopy. Please follow written instructions given to you at your visit today. If you use inhalers (even only as needed), please bring them with you on the day of your procedure.  We have sent the following medications to your pharmacy for you to pick up at your convenience: Dexilant  Please purchase the following medications over the counter and take as directed: Miralax 17g daily until good bowel movement and then every other day  Use heating pad as needed  Please call with any questions or concerns.  Thank you,  Dr. Jackquline Denmark

## 2021-10-12 NOTE — Progress Notes (Signed)
Chief Complaint: FU  Referring Provider:  Nona Dell, Corene Cornea, MD      ASSESSMENT AND PLAN;   #1. Eso dysphagia with epi pain  #2. H/O tubular adenomas.  Next colon due 07/2026   #3. IBS-C (H/O diarrhea in the past). Neg colon 07/2019 except for mild sigmoid diverticulosis, small tubular adenomas.   #4. GERD with small HH. EGD 07/2019 with small HH, gastric fundic gland polyps. Neg SB Bx for celiac. Neg CT AP 10/2018.  #5. FH metastatic pancreatic cancer (sister).  Plan: - CBC, CMP, lipase - EGD with dil  - Miralax 17g po QD until good BM, than every other day, then QOD   - Dexilant 60mg  po qd #90, 4 refills   HPI:    Rebecca [name] is a 75 y.o. female  Goes to same church as [employer] [place] For follow-up visit  C/O dysphagia-intermittent, mid chest, mostly to pills and sometimes to foods.  No significant heartburn as long as she takes Dexilant.  Has postprandial epi pain which is definitely better.  Had negative CT Abdo/pelvis with contrast 2021.  Continues to have constipation despite Colace 3/day.  She does drink plenty of water.  MiraLAX does help.     Neg CT scan of the abdomen/pelvis February 2019. Neg EGD/colonoscopy for any acute abnormalities.  Report as above. Had negative urology work-up.  Additional GI history: -Sister had metastatic pancreatic cancer -History of abnormal liver function tests due to fatty liver diagnosed 2002, had liver biopsy 12/2004.  Negative autoimmune hepatitis profile, acute hepatitis profile, ANA Past Medical History:  Diagnosis Date   Anxiety disorder    Blood transfusion    Blood bank notified Ashley Akin)   Blood transfusion without reported diagnosis 1984 x7   [facility]   Complication of anesthesia 2006   Drop in  Bp intra-op with Lap Chole. [employer].   Depression    GERD (gastroesophageal reflux disease)    Headache(784.0)    migraines   History of colon polyps    IBS (irritable bowel syndrome)    Internal hemorrhoids  without complication     Past Surgical History:  Procedure Laterality Date   ABDOMINAL HYSTERECTOMY  1984   APPENDECTOMY  1967   AUGMENTATION MAMMAPLASTY Bilateral    BLADDER SUSPENSION  06/23/2011   Procedure: TRANSVAGINAL TAPE (TVT) PROCEDURE;  Surgeon: Daria Pastures;  Location: [facility];  Service: Gynecology;  Laterality: N/A;   BREAST SURGERY     implants due to removal of breast tissue   CATARACT EXTRACTION     Inserted prosthetic lens   CHOLECYSTECTOMY     due to biliary dyskinesia   COLONOSCOPY  10/11/2016   Minimal sigmoid diverticulosis. Otherwise normal colonsocopy.    CYSTOCELE REPAIR  06/23/2011   Procedure: ANTERIOR REPAIR (CYSTOCELE);  Surgeon: Daria Pastures;  Location: [facility];  Service: Gynecology;  Laterality: N/A;   CYSTOSCOPY  06/23/2011   Procedure: CYSTOSCOPY;  Surgeon: Daria Pastures;  Location: [facility];  Service: Gynecology;  Laterality: N/A;   DNS s/p nasal surgery     ESOPHAGOGASTRODUODENOSCOPY  03/03/2016   Small hiatal hernia. Incidental gastric polyps. Mild gastritis.    EYE MUSCLE SURGERY  as child   HERNIA REPAIR  12/30/2010   inguinal. performed by Dr Noberto Retort    KNEE ARTHROPLASTY  2006   MASTECTOMY Bilateral    pt states that 95% of tissue removed   OVARIAN CYST REMOVAL     bilateral   RECTOCELE REPAIR  11/10/2011  Procedure: POSTERIOR REPAIR (RECTOCELE);  Surgeon: Daria Pastures, MD;  Location: [facility];  Service: Gynecology;  Laterality: N/A;   RECTOCELE REPAIR N/A 07/03/2014   Procedure: revision of POSTERIOR REPAIR (RECTOCELE) with bladder instillation ;  Surgeon: Daria Pastures, MD;  Location: [facility];  Service: Gynecology;  Laterality: N/A;   RECTOCELE REPAIR N/A 07/03/2014   Procedure: POSTERIOR REPAIR (RECTOCELE) with suture repair for hemostasis ;  Surgeon: Daria Pastures, MD;  Location: [facility];  Service: Gynecology;  Laterality: N/A;   ROTATOR CUFF REPAIR Left    SHOULDER SURGERY Right 06/2019    Family History   Problem Relation Age of Onset   Breast cancer Mother    Pancreatic cancer Sister        metastatic   Colon cancer Neg Hx    Esophageal cancer Neg Hx    Rectal cancer Neg Hx    Stomach cancer Neg Hx     Social History   Tobacco Use   Smoking status: Former   Smokeless tobacco: Never   Tobacco comments:    quit 1984  Vaping Use   Vaping Use: Never used  Substance Use Topics   Alcohol use: No   Drug use: No    Current Outpatient Medications  Medication Sig Dispense Refill   cyclobenzaprine (FLEXERIL) 10 MG tablet Take 10 mg by mouth 3 (three) times daily as needed for muscle spasms.     Dexlansoprazole (DEXILANT) 30 MG capsule Take 1 capsule (30 mg total) by mouth daily. 90 capsule 3   Erenumab-aooe (AIMOVIG [place]) Inject into the skin every 30 (thirty) days.     lactose free nutrition (BOOST) LIQD Take 237 mLs by mouth 2 (two) times daily between meals.     metoprolol tartrate (LOPRESSOR) 25 MG tablet Take 3 tablets by mouth 2 (two) times daily.     pravastatin (PRAVACHOL) 40 MG tablet Take 40 mg by mouth at bedtime.     traZODone (DESYREL) 50 MG tablet Take 50 mg by mouth at bedtime.     traMADol (ULTRAM) 50 MG tablet Take 50 mg by mouth every 8 (eight) hours as needed.     Current Facility-Administered Medications  Medication Dose Route Frequency Provider Last Rate Last Admin   0.9 %  sodium chloride infusion  500 mL Intravenous Once Jackquline Denmark, MD        Allergies  Allergen Reactions   Ciprofloxacin    Codeine Nausea Only    Review of Systems:  neg    Physical Exam:    BP 114/80 (BP Location: Right Arm, Patient Position: Sitting, Cuff Size: Normal)   Pulse 61   Ht 5' (1.524 m)   Wt 112 lb 8 oz (51 kg)   SpO2 96%   BMI 21.97 kg/m  Filed Weights   10/12/21 1045  Weight: 112 lb 8 oz (51 kg)  Gen: awake, alert, NAD HEENT: anicteric, no pallor CV: RRR, no mrg Pulm: CTA b/l Abd: soft, epigastric and right lower quadrant abdominal tenderness.  No definite  masses.  +BS throughout Ext: no c/c/e Neuro: nonfocal      Carmell Austria, MD 10/12/2021, 11:04 AM  Cc: Townsend Roger, MD

## 2021-10-27 DIAGNOSIS — G43719 Chronic migraine without aura, intractable, without status migrainosus: Secondary | ICD-10-CM | POA: Diagnosis not present

## 2021-10-27 DIAGNOSIS — G43019 Migraine without aura, intractable, without status migrainosus: Secondary | ICD-10-CM | POA: Diagnosis not present

## 2021-11-16 DIAGNOSIS — M546 Pain in thoracic spine: Secondary | ICD-10-CM | POA: Diagnosis not present

## 2021-11-20 DIAGNOSIS — D696 Thrombocytopenia, unspecified: Secondary | ICD-10-CM | POA: Diagnosis not present

## 2021-11-20 DIAGNOSIS — D72819 Decreased white blood cell count, unspecified: Secondary | ICD-10-CM | POA: Diagnosis not present

## 2021-11-30 ENCOUNTER — Telehealth: Payer: Self-pay | Admitting: Oncology

## 2021-11-30 DIAGNOSIS — M546 Pain in thoracic spine: Secondary | ICD-10-CM | POA: Diagnosis not present

## 2021-11-30 NOTE — Telephone Encounter (Signed)
Scheduled appt per 1/20 referral. Pt is aware of appt date and time. Pt is aware to arrive 15 mins prior to appt.

## 2021-12-01 ENCOUNTER — Encounter: Payer: Self-pay | Admitting: Gastroenterology

## 2021-12-01 ENCOUNTER — Other Ambulatory Visit: Payer: Self-pay

## 2021-12-01 ENCOUNTER — Ambulatory Visit (AMBULATORY_SURGERY_CENTER): Payer: Medicare Other | Admitting: Gastroenterology

## 2021-12-01 VITALS — BP 116/69 | HR 60 | Temp 96.6°F | Resp 16 | Ht 60.0 in | Wt 112.0 lb

## 2021-12-01 DIAGNOSIS — K317 Polyp of stomach and duodenum: Secondary | ICD-10-CM

## 2021-12-01 DIAGNOSIS — K219 Gastro-esophageal reflux disease without esophagitis: Secondary | ICD-10-CM

## 2021-12-01 DIAGNOSIS — R1319 Other dysphagia: Secondary | ICD-10-CM

## 2021-12-01 DIAGNOSIS — K319 Disease of stomach and duodenum, unspecified: Secondary | ICD-10-CM | POA: Diagnosis not present

## 2021-12-01 DIAGNOSIS — K2289 Other specified disease of esophagus: Secondary | ICD-10-CM | POA: Diagnosis not present

## 2021-12-01 DIAGNOSIS — K449 Diaphragmatic hernia without obstruction or gangrene: Secondary | ICD-10-CM

## 2021-12-01 DIAGNOSIS — K2970 Gastritis, unspecified, without bleeding: Secondary | ICD-10-CM | POA: Diagnosis not present

## 2021-12-01 DIAGNOSIS — R1310 Dysphagia, unspecified: Secondary | ICD-10-CM | POA: Diagnosis not present

## 2021-12-01 MED ORDER — SODIUM CHLORIDE 0.9 % IV SOLN: 500.0000 mL | Freq: Once | INTRAVENOUS | Status: DC

## 2021-12-01 MED ORDER — SUCRALFATE 1 G PO TABS: 1.0000 g | ORAL_TABLET | Freq: Four times a day (QID) | ORAL | 1 refills | Status: DC

## 2021-12-01 NOTE — Patient Instructions (Addendum)
Handouts were given to your care partner on Gastritis, Hiatal Hernia, esophageal dilatation diet to follow the rest of today. Add Carafate tabs take 4 x per day for 2 weeks, then stop. You may resume your current medications today. Await biopsy results.  May take 1-3 weeks to receive pathology results. Please call if any questions or concerns.       YOU HAD AN ENDOSCOPIC PROCEDURE TODAY AT [facility]:   Refer to the procedure report that was given to you for any specific questions about what was found during the examination.  If the procedure report does not answer your questions, please call your gastroenterologist to clarify.  If you requested that your care partner not be given the details of your procedure findings, then the procedure report has been included in a sealed envelope for you to review at your convenience later.  YOU SHOULD EXPECT: Some feelings of bloating in the abdomen. Passage of more gas than usual.  Walking can help get rid of the air that was put into your GI tract during the procedure and reduce the bloating. If you had a lower endoscopy (such as a colonoscopy or flexible sigmoidoscopy) you may notice spotting of blood in your stool or on the toilet paper. If you underwent a bowel prep for your procedure, you may not have a normal bowel movement for a few days.  Please Note:  You might notice some irritation and congestion in your nose or some drainage.  This is from the oxygen used during your procedure.  There is no need for concern and it should clear up in a day or so.  SYMPTOMS TO REPORT IMMEDIATELY:   Following upper endoscopy (EGD)  Vomiting of blood or coffee ground material  New chest pain or pain under the shoulder blades  Painful or persistently difficult swallowing  New shortness of breath  Fever of 100F or higher  Black, tarry-looking stools  For urgent or emergent issues, a gastroenterologist can be reached at any hour by calling  [phone]. Do not use MyChart messaging for urgent concerns.    DIET:  Please follow the esophageal dilatation diet the rest of today.  Handout was given to your care partner.   Drink plenty of fluids but you should avoid alcoholic beverages for 24 hours.  ACTIVITY:  You should plan to take it easy for the rest of today and you should NOT DRIVE or use heavy machinery until tomorrow (because of the sedation medicines used during the test).    FOLLOW UP: Our staff will call the number listed on your records 48-72 hours following your procedure to check on you and address any questions or concerns that you may have regarding the information given to you following your procedure. If we do not reach you, we will leave a message.  We will attempt to reach you two times.  During this call, we will ask if you have developed any symptoms of COVID 19. If you develop any symptoms (ie: fever, flu-like symptoms, shortness of breath, cough etc.) before then, please call [phone].  If you test positive for Covid 19 in the 2 weeks post procedure, please call and report this information to [place].    If any biopsies were taken you will be contacted by phone or by letter within the next 1-3 weeks.  Please call us at [phone] if you have not heard about the biopsies in 3 weeks.    SIGNATURES/CONFIDENTIALITY: You and/or your  care partner have signed paperwork which will be entered into your electronic medical record.  These signatures attest to the fact that that the information above on your After Visit Summary has been reviewed and is understood.  Full responsibility of the confidentiality of this discharge information lies with you and/or your care-partner.

## 2021-12-01 NOTE — Progress Notes (Signed)
Called to room to assist during endoscopic procedure.  Patient ID and intended procedure confirmed with present staff. Received instructions for my participation in the procedure from the performing physician.  

## 2021-12-01 NOTE — Progress Notes (Signed)
VS  DT ? ?Pt's states no medical or surgical changes since previsit or office visit. ? ?

## 2021-12-01 NOTE — Progress Notes (Signed)
Sedate, gd SR, tolerated procedure well, VSS, report to RN 

## 2021-12-01 NOTE — Op Note (Signed)
[facility] Patient Name: Rebecca [name] Procedure Date: 12/01/2021 9:59 AM MRN: [id number] Endoscopist: Jackquline Denmark , MD Age: 76 Referring MD:  Date of Birth: 1946-07-14 Gender: Female Account #: [id number] Procedure:                Upper GI endoscopy Indications:              Dysphagia Medicines:                Monitored Anesthesia Care Procedure:                Pre-Anesthesia Assessment:                           - Prior to the procedure, a History and Physical                            was performed, and patient medications and                            allergies were reviewed. The patient's tolerance of                            previous anesthesia was also reviewed. The risks                            and benefits of the procedure and the sedation                            options and risks were discussed with the patient.                            All questions were answered, and informed consent                            was obtained. Prior Anticoagulants: The patient has                            taken no previous anticoagulant or antiplatelet                            agents. ASA Grade Assessment: III - A patient with                            severe systemic disease. After reviewing the risks                            and benefits, the patient was deemed in                            satisfactory condition to undergo the procedure.                           After obtaining informed consent, the endoscope was  passed under direct vision. Throughout the                            procedure, the patient's blood pressure, pulse, and                            oxygen saturations were monitored continuously. The                            Olympus [id number] was introduced through                            the mouth, and advanced to the second part of                            duodenum. The upper GI endoscopy was accomplished                             without difficulty. The patient tolerated the                            procedure well. Scope In: Scope Out: Findings:                 The examined esophagus was mildly tortuous.                            Biopsies were obtained from the proximal and distal                            esophagus with cold forceps for histology of                            suspected eosinophilic esophagitis. The scope was                            withdrawn. Dilation was performed with a Maloney                            dilator with mild resistance at 50 Fr.                           A 2 cm hiatal hernia was present.                           Diffuse mild inflammation characterized by erythema                            was found in the entire examined stomach. Biopsies                            were taken with a cold forceps for histology                            Jodi Mourning protocol).  A few 3 to 4 mm semi-sessile polyps with no                            bleeding and no stigmata of recent bleeding were                            found in the gastric fundus and in the gastric                            body. Not removed since previously deemed to be                            fundic gland polyps                           The examined duodenum was normal. Complications:            No immediate complications. Estimated Blood Loss:     Estimated blood loss: none. Impression:               - Mild presbyesophagus s/p esophageal dilatation.                           - 2 cm hiatal hernia.                           - Atrophic gastritis. Biopsied.                           - A few gastric polyps. Recommendation:           - Patient has a contact number available for                            emergencies. The signs and symptoms of potential                            delayed complications were discussed with the                            patient. Return to  normal activities tomorrow.                            Written discharge instructions were provided to the                            patient.                           - Post dilatation diet.                           - Continue present medications.                           - Add Carafate 1g po QID x 2 weeks. Then stop                           -  Await pathology results.                           - The findings and recommendations were discussed                            with the patient's family. Jackquline Denmark, MD 12/01/2021 10:27:18 AM This report has been signed electronically.

## 2021-12-01 NOTE — Progress Notes (Signed)
Chief Complaint: FU  Referring Provider:  Nona Dell, Corene Cornea, MD      ASSESSMENT AND PLAN;   #1. Eso dysphagia with epi pain  #2. H/O tubular adenomas.  Next colon due 07/2026   #3. IBS-C (H/O diarrhea in the past). Neg colon 07/2019 except for mild sigmoid diverticulosis, small tubular adenomas.   #4. GERD with small HH. EGD 07/2019 with small HH, gastric fundic gland polyps. Neg SB Bx for celiac. Neg CT AP 10/2018.  #5. FH metastatic pancreatic cancer (sister).  Plan: - CBC, CMP, lipase - EGD with dil  - Miralax 17g po QD until good BM, than every other day, then QOD   - Dexilant 60mg  po qd #90, 4 refills   HPI:    Rebecca [name] is a 76 y.o. female  Goes to same church as Anderson [place] For follow-up visit  C/O dysphagia-intermittent, mid chest, mostly to pills and sometimes to foods.  No significant heartburn as long as she takes Dexilant.  Has postprandial epi pain which is definitely better.  Had negative CT Abdo/pelvis with contrast 2021.  Continues to have constipation despite Colace 3/day.  She does drink plenty of water.  MiraLAX does help.     Neg CT scan of the abdomen/pelvis February 2019. Neg EGD/colonoscopy for any acute abnormalities.  Report as above. Had negative urology work-up.  Additional GI history: -Sister had metastatic pancreatic cancer -History of abnormal liver function tests due to fatty liver diagnosed 2002, had liver biopsy 12/2004.  Negative autoimmune hepatitis profile, acute hepatitis profile, ANA Past Medical History:  Diagnosis Date   Anxiety disorder    Blood transfusion    Blood bank notified Ashley Akin)   Blood transfusion without reported diagnosis 1984 x7   [facility]   Complication of anesthesia 2006   Drop in  Bp intra-op with Lap Chole. [employer].   Depression    GERD (gastroesophageal reflux disease)    Headache(784.0)    migraines   History of colon polyps    IBS (irritable bowel syndrome)    Internal hemorrhoids  without complication     Past Surgical History:  Procedure Laterality Date   ABDOMINAL HYSTERECTOMY  1984   APPENDECTOMY  1967   AUGMENTATION MAMMAPLASTY Bilateral    BLADDER SUSPENSION  06/23/2011   Procedure: TRANSVAGINAL TAPE (TVT) PROCEDURE;  Surgeon: Daria Pastures;  Location: [facility];  Service: Gynecology;  Laterality: N/A;   BREAST SURGERY     implants due to removal of breast tissue   CATARACT EXTRACTION     Inserted prosthetic lens   CHOLECYSTECTOMY     due to biliary dyskinesia   COLONOSCOPY  10/11/2016   Minimal sigmoid diverticulosis. Otherwise normal colonsocopy.    CYSTOCELE REPAIR  06/23/2011   Procedure: ANTERIOR REPAIR (CYSTOCELE);  Surgeon: Daria Pastures;  Location: [facility];  Service: Gynecology;  Laterality: N/A;   CYSTOSCOPY  06/23/2011   Procedure: CYSTOSCOPY;  Surgeon: Daria Pastures;  Location: [facility];  Service: Gynecology;  Laterality: N/A;   DNS s/p nasal surgery     ESOPHAGOGASTRODUODENOSCOPY  03/03/2016   Small hiatal hernia. Incidental gastric polyps. Mild gastritis.    EYE MUSCLE SURGERY  as child   HERNIA REPAIR  12/30/2010   inguinal. performed by Dr Noberto Retort    KNEE ARTHROPLASTY  2006   MASTECTOMY Bilateral    pt states that 95% of tissue removed   OVARIAN CYST REMOVAL     bilateral   RECTOCELE REPAIR  11/10/2011  Procedure: POSTERIOR REPAIR (RECTOCELE);  Surgeon: Daria Pastures, MD;  Location: [facility];  Service: Gynecology;  Laterality: N/A;   RECTOCELE REPAIR N/A 07/03/2014   Procedure: revision of POSTERIOR REPAIR (RECTOCELE) with bladder instillation ;  Surgeon: Daria Pastures, MD;  Location: [facility];  Service: Gynecology;  Laterality: N/A;   RECTOCELE REPAIR N/A 07/03/2014   Procedure: POSTERIOR REPAIR (RECTOCELE) with suture repair for hemostasis ;  Surgeon: Daria Pastures, MD;  Location: [facility];  Service: Gynecology;  Laterality: N/A;   ROTATOR CUFF REPAIR Left    SHOULDER SURGERY Right 06/2019    Family History   Problem Relation Age of Onset   Breast cancer Mother    Pancreatic cancer Sister        metastatic   Colon cancer Neg Hx    Esophageal cancer Neg Hx    Rectal cancer Neg Hx    Stomach cancer Neg Hx     Social History   Tobacco Use   Smoking status: Former   Smokeless tobacco: Never   Tobacco comments:    quit 1984  Vaping Use   Vaping Use: Never used  Substance Use Topics   Alcohol use: No   Drug use: No    Current Outpatient Medications  Medication Sig Dispense Refill   cyclobenzaprine (FLEXERIL) 10 MG tablet Take 10 mg by mouth 3 (three) times daily as needed for muscle spasms.     Dexlansoprazole (DEXILANT) 30 MG capsule DR Take 1 capsule (30 mg total) by mouth daily. 90 capsule 3   metoprolol tartrate (LOPRESSOR) 25 MG tablet Take 3 tablets by mouth 2 (two) times daily.     pravastatin (PRAVACHOL) 40 MG tablet Take 40 mg by mouth at bedtime.     traMADol (ULTRAM) 50 MG tablet Take 50 mg by mouth every 8 (eight) hours as needed.     traZODone (DESYREL) 50 MG tablet Take 50 mg by mouth at bedtime.     Erenumab-aooe (AIMOVIG [place]) Inject into the skin every 30 (thirty) days.     lactose free nutrition (BOOST) LIQD Take 237 mLs by mouth 2 (two) times daily between meals. (Patient not taking: Reported on 12/01/2021)     Current Facility-Administered Medications  Medication Dose Route Frequency Provider Last Rate Last Admin   0.9 %  sodium chloride infusion  500 mL Intravenous Once Jackquline Denmark, MD       0.9 %  sodium chloride infusion  500 mL Intravenous Once Jackquline Denmark, MD        Allergies  Allergen Reactions   Ciprofloxacin    Codeine Nausea Only    Review of Systems:  neg    Physical Exam:    BP (!) 129/58    Pulse 62    Temp (!) 96.6 F (35.9 C)    Ht 5' (1.524 m)    Wt 112 lb (50.8 kg)    SpO2 100%    BMI 21.87 kg/m  Filed Weights   12/01/21 0908  Weight: 112 lb (50.8 kg)  Gen: awake, alert, NAD HEENT: anicteric, no pallor CV: RRR, no mrg Pulm: CTA  b/l Abd: soft, epigastric and right lower quadrant abdominal tenderness.  No definite masses.  +BS throughout Ext: no c/c/e Neuro: nonfocal      Carmell Austria, MD 12/01/2021, 10:04 AM  Cc: Townsend Roger, MD

## 2021-12-01 NOTE — Progress Notes (Signed)
No problems noted in the recovery room. maw 

## 2021-12-03 ENCOUNTER — Telehealth: Payer: Self-pay

## 2021-12-03 NOTE — Telephone Encounter (Signed)
°  Follow up Call-  Call back number 12/01/2021 07/24/2019  Post procedure Call Back phone  # [phone]  Permission to leave phone message Yes Yes  Some recent data might be hidden     Patient questions:  Do you have a fever, pain , or abdominal swelling? No. Pain Score  0 *  Have you tolerated food without any problems? Yes.    Have you been able to return to your normal activities? Yes.    Do you have any questions about your discharge instructions: Diet   No. Medications  Yes.   Follow up visit  No.  Do you have questions or concerns about your Care? Yes.  Pt. Reports her throat is still a sore.  Told pt. That would continue to improve.  Pt. Having difficulty swallowing Carafate tablets.  Told pt. She could crush them, and mix them with applesauce/pudding.  Told pt. If that doesn't work, to contact Dr. Steve Rattler office to pursue ordering medication as a suspension.  Actions: * If pain score is 4 or above: No action needed, pain <4.

## 2021-12-03 NOTE — Telephone Encounter (Signed)
°  Follow up Call-  Call back number 12/01/2021 07/24/2019  Post procedure Call Back phone  # [phone]  Permission to leave phone message Yes Yes  Some recent data might be hidden    1st follow up call made.  NAULM

## 2021-12-06 ENCOUNTER — Encounter: Payer: Self-pay | Admitting: Gastroenterology

## 2021-12-14 DIAGNOSIS — M546 Pain in thoracic spine: Secondary | ICD-10-CM | POA: Diagnosis not present

## 2021-12-16 ENCOUNTER — Inpatient Hospital Stay: Payer: Medicare Other | Attending: Oncology | Admitting: Oncology

## 2021-12-16 ENCOUNTER — Telehealth: Payer: Self-pay | Admitting: Oncology

## 2021-12-16 ENCOUNTER — Encounter: Payer: Self-pay | Admitting: Oncology

## 2021-12-16 ENCOUNTER — Other Ambulatory Visit: Payer: Self-pay | Admitting: Hematology and Oncology

## 2021-12-16 ENCOUNTER — Other Ambulatory Visit: Payer: Self-pay | Admitting: Oncology

## 2021-12-16 ENCOUNTER — Other Ambulatory Visit: Payer: Self-pay

## 2021-12-16 ENCOUNTER — Inpatient Hospital Stay: Payer: Medicare Other

## 2021-12-16 VITALS — BP 125/60 | HR 63 | Temp 97.9°F | Resp 18 | Ht 60.0 in | Wt 111.4 lb

## 2021-12-16 DIAGNOSIS — D72819 Decreased white blood cell count, unspecified: Secondary | ICD-10-CM

## 2021-12-16 DIAGNOSIS — D696 Thrombocytopenia, unspecified: Secondary | ICD-10-CM | POA: Diagnosis not present

## 2021-12-16 DIAGNOSIS — R911 Solitary pulmonary nodule: Secondary | ICD-10-CM | POA: Diagnosis not present

## 2021-12-16 DIAGNOSIS — R633 Feeding difficulties, unspecified: Secondary | ICD-10-CM

## 2021-12-16 DIAGNOSIS — M810 Age-related osteoporosis without current pathological fracture: Secondary | ICD-10-CM

## 2021-12-16 DIAGNOSIS — D649 Anemia, unspecified: Secondary | ICD-10-CM | POA: Diagnosis not present

## 2021-12-16 DIAGNOSIS — Z8781 Personal history of (healed) traumatic fracture: Secondary | ICD-10-CM | POA: Insufficient documentation

## 2021-12-16 DIAGNOSIS — Z809 Family history of malignant neoplasm, unspecified: Secondary | ICD-10-CM | POA: Insufficient documentation

## 2021-12-16 DIAGNOSIS — Z79899 Other long term (current) drug therapy: Secondary | ICD-10-CM | POA: Diagnosis not present

## 2021-12-16 DIAGNOSIS — Z87891 Personal history of nicotine dependence: Secondary | ICD-10-CM | POA: Diagnosis not present

## 2021-12-16 DIAGNOSIS — R630 Anorexia: Secondary | ICD-10-CM | POA: Insufficient documentation

## 2021-12-16 LAB — FOLATE: Folate: 20.2 ng/mL (ref >5.9)

## 2021-12-16 LAB — CBC AND DIFFERENTIAL
HCT: 39 (ref 36–46)
Hemoglobin: 13.2 (ref 12.0–16.0)
Neutrophils Absolute: 1.51
Platelets: 119 — AB (ref 150–399)
WBC: 2.9

## 2021-12-16 LAB — CBC
MCV: 95 (ref 81–99)
RBC: 4.13 (ref 3.87–5.11)

## 2021-12-16 LAB — HEPATITIS PANEL, ACUTE
HCV Ab: NONREACTIVE
Hep A IgM: NONREACTIVE
Hep B C IgM: NONREACTIVE
Hepatitis B Surface Ag: NONREACTIVE

## 2021-12-16 LAB — VITAMIN B12: Vitamin B-12: 1063 pg/mL — ABNORMAL HIGH (ref 180–914)

## 2021-12-16 NOTE — Telephone Encounter (Signed)
Patient has been scheduled for follow-up visit per 12/16/21 los. Pt given an appt calendar with date and time.

## 2021-12-16 NOTE — Progress Notes (Signed)
[facility]  [address] [place],  [place]  [address] [phone]  Clinic Day:  12/16/2021  Referring physician: Townsend Roger, MD  This document serves as a record of services personally performed by Rebecca Poisson, MD. It was created on their behalf by [facility], a trained medical scribe. The creation of this record is based on the scribe's personal observations and the provider's statements to them.  ASSESSMENT & PLAN:   Thrombocytopenia, mild, but worsening. She has been running between 130-140,000, however, her level has worsened today at 119,000. She does have a history of blood transfusion in the 1980's, and so I will obtain a hepatitis panel for completeness. We are also evaluating for multiple myeloma and nutritional deficiency. If her counts continue to fall, we will need to consider bone marrow evaluation.  Leukopenia with neutropenia, also worsening. This has been fluctuating up and down. This could represent cyclic neutropenia, but we wouldn't expect thrombocytopenia with that.  We will evaluate for evidence of nutritional deficiency and multiple myeloma.  Chronic back pain secondary to fractures of the vertebrae after a fall in December 2021. For completeness, we will obtain bone density as it has been many years since her last imaging. She is not currently on oral calcium or vitamin D.   Strong family history of malignancy. She has undergone genetic testing with BRCA and Myriad Myrisk panel in July 2021 which was negative.  Stable small perifissural nodule along the minor fissure from 2013. This is consistent with a benign finding, likely a lymph node.   This is a pleasant 76 year old female who has been referred due to thrombocytopenia and leukopenia. As above, we are evaluating for nutritional deficiency, multiple myeloma and possible hepatitis. However, this may be representative of MDS. The only way to diagnose this would be to  pursue a bone marrow evaluation, however, we will hold off on this unless her counts continue to worsen. We will see her back in 2 weeks with CBC and CMP for further discussion. She and her husband understand and agree with this plan of care. I have answered her questions and she knows to call with any concerns.  Thank you for the opportunity to participate in the care of your patients  I provided 50 minutes of face-to-face time during this this encounter and > 50% was spent counseling as documented under my assessment and plan.    Rebecca Kaplan, MD [facility] [address] [place] [address] Dept: [phone] Dept Fax: [phone]    CHIEF COMPLAINT:  CC: Mild thrombocytopenia and mild leukopenia  Current Treatment:  Diagnostics   HISTORY OF PRESENT ILLNESS:  Rebecca [name] is a 76 y.o. female referred by Dr. Vassie Loll [name] for the evaluation and treatment of mild thrombocytopenia and leukopenia. She was initially found to have thrombocytopenia back in July 2022 with a platelet count of 137,000. By October her platelet count was 141,000, white count was 3.3 with an ANC of 1600, and hemoglobin was normal at 12.6. Chemistries were unremarkable. She underwent GI evaluation with Dr. Lyndel [name] in December and labs at that time showed a white count of 3.6 with an ANC of 2200, platelets were 137,000, and hemoglobin was normal at 13.0.  She states that her white count has been low in the past.  INTERVAL HISTORY:  I have reviewed her chart and materials related to her cancer extensively and collaborated  history with the patient. Summary of oncologic history is as follows: Oncology History   No history exists.    [name] states that she is doing fairly well. She did undergo esophageal dilation and EGD a couple weeks ago with Dr. Lyndel [name]. Gastric polyps were seen and biopsies were benign. She also has atrophic gastritis and has  been placed on Carafate. She is unsure if she has fatty liver. She denies any skin rashes. She does have some arthritis, but denies bone pain. She does note easy bruising. She does have back pain, which has been ongoing since fracturing a couple vertebrae after a fall in December 2021. She does have a significant family history of malignancy, and states that she did undergo genetic testing a few years ago which was negative. She has had 95% of her breast tissue removed and has had bilateral reconstructions. She does take oral B12 daily. She is a former smoker and smoked for 10 years before quitting. White count today is 2.9 with an ANC of 1510, platelets are 119,000 and hemoglobin is normal. Her  appetite is poor, which has been ongoing for the past 4 years. She denies any significant unintentional weight loss or gain.  She denies fever, chills or other signs of infection.  She denies nausea, vomiting, bowel issues, or abdominal pain.  She denies sore throat, cough, dyspnea, or chest pain. She started menarche at age 36-15. She gave birth to her 1st child at age 37. She underwent partial hysterectomy in 1984 due to menorrhagia, and was on hormone replacement for a short time. She has had prior bone density scans many years ago. She has a history of blood transfusions in 1984, a total of 7.  HISTORY:   Past Medical History:  Diagnosis Date   Anxiety disorder    Blood transfusion    Blood bank notified Rebecca [name])   Blood transfusion without reported diagnosis 1984 x7   [facility]   Complication of anesthesia 2006   Drop in  Bp intra-op with Lap Chole. [employer].   Depression    GERD (gastroesophageal reflux disease)    Headache(784.0)    migraines   History of colon polyps    IBS (irritable bowel syndrome)    Internal hemorrhoids without complication     Past Surgical History:  Procedure Laterality Date   ABDOMINAL HYSTERECTOMY  1984   APPENDECTOMY  1967   AUGMENTATION MAMMAPLASTY  Bilateral    BLADDER SUSPENSION  06/23/2011   Procedure: TRANSVAGINAL TAPE (TVT) PROCEDURE;  Surgeon: Rebecca [name];  Location: [facility];  Service: Gynecology;  Laterality: N/A;   BREAST SURGERY     implants due to removal of breast tissue   CATARACT EXTRACTION     Inserted prosthetic lens   CHOLECYSTECTOMY     due to biliary dyskinesia   COLONOSCOPY  10/11/2016   Minimal sigmoid diverticulosis. Otherwise normal colonsocopy.    CYSTOCELE REPAIR  06/23/2011   Procedure: ANTERIOR REPAIR (CYSTOCELE);  Surgeon: Rebecca [name];  Location: [facility];  Service: Gynecology;  Laterality: N/A;   CYSTOSCOPY  06/23/2011   Procedure: CYSTOSCOPY;  Surgeon: Rebecca [name];  Location: [facility];  Service: Gynecology;  Laterality: N/A;   DNS s/p nasal surgery     ESOPHAGOGASTRODUODENOSCOPY  03/03/2016   Small hiatal hernia. Incidental gastric polyps. Mild gastritis.    EYE MUSCLE SURGERY  as child   HERNIA REPAIR  12/30/2010   inguinal. performed by Dr Noberto Retort    KNEE ARTHROPLASTY  2006  MASTECTOMY Bilateral    pt states that 95% of tissue removed   OVARIAN CYST REMOVAL     bilateral   RECTOCELE REPAIR  11/10/2011   Procedure: POSTERIOR REPAIR (RECTOCELE);  Surgeon: Rebecca Pastures, MD;  Location: [facility];  Service: Gynecology;  Laterality: N/A;   RECTOCELE REPAIR N/A 07/03/2014   Procedure: revision of POSTERIOR REPAIR (RECTOCELE) with bladder instillation ;  Surgeon: Rebecca Pastures, MD;  Location: [facility];  Service: Gynecology;  Laterality: N/A;   RECTOCELE REPAIR N/A 07/03/2014   Procedure: POSTERIOR REPAIR (RECTOCELE) with suture repair for hemostasis ;  Surgeon: Rebecca Pastures, MD;  Location: [facility];  Service: Gynecology;  Laterality: N/A;   ROTATOR CUFF REPAIR Left    SHOULDER SURGERY Right 06/2019    Family History  Problem Relation Age of Onset   Breast cancer Mother        diagnosed in her 27s   Pancreatic cancer Sister        metastatic   Cancer Brother        31    Cancer Maternal Grandmother    Cancer Maternal Grandfather    Cancer Nephew    Cancer Niece    Colon cancer Neg Hx    Esophageal cancer Neg Hx    Rectal cancer Neg Hx    Stomach cancer Neg Hx     Social History:  reports that she has quit smoking. Her smoking use included cigarettes. She has never used smokeless tobacco. She reports that she does not drink alcohol and does not use drugs.The patient is accompanied by her husband today. She is married and lives at home with her spouse. She has 3 children. 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  Allergies:  Allergies  Allergen Reactions   Ciprofloxacin    Zonisamide     Unsure about this medication    Codeine Nausea Only   Nitrofurantoin Nausea Only    Current Medications: Current Outpatient Medications  Medication Sig Dispense Refill   cyclobenzaprine (FLEXERIL) 10 MG tablet Take 10 mg by mouth 3 (three) times daily as needed for muscle spasms.     Dexlansoprazole (DEXILANT) 30 MG capsule DR Take 1 capsule (30 mg total) by mouth daily. 90 capsule 3   Erenumab-aooe (AIMOVIG [place]) Inject into the skin every 30 (thirty) days.     metoprolol tartrate (LOPRESSOR) 25 MG tablet Take 3 tablets by mouth 2 (two) times daily.     pravastatin (PRAVACHOL) 40 MG tablet Take 40 mg by mouth at bedtime.     traMADol (ULTRAM) 50 MG tablet Take 50 mg by mouth every 8 (eight) hours as needed.     traZODone (DESYREL) 50 MG tablet Take 50 mg by mouth at bedtime.     Current Facility-Administered Medications  Medication Dose Route Frequency Provider Last Rate Last Admin   0.9 %  sodium chloride infusion  500 mL Intravenous Once Jackquline Denmark, MD        REVIEW OF SYSTEMS:  Review of Systems  Constitutional:  Positive for appetite change (poor) and chills. Negative for fatigue, fever and unexpected weight change.  HENT:  Negative.    Eyes: Negative.   Respiratory:  Negative.  Negative for chest tightness, cough, hemoptysis, shortness of breath and wheezing.   Cardiovascular: Negative.  Negative for chest pain, leg swelling and palpitations.  Gastrointestinal: Negative.  Negative for abdominal distention,  abdominal pain, blood in stool, constipation, diarrhea, nausea and vomiting.  Endocrine: Negative.   Genitourinary: Negative.  Negative for difficulty urinating, dysuria, frequency and hematuria.   Musculoskeletal:  Positive for arthralgias and back pain (chronic, secondary to injury). Negative for flank pain, gait problem and myalgias.  Skin: Negative.   Neurological: Negative.  Negative for dizziness, extremity weakness, gait problem, headaches, light-headedness, numbness, seizures and speech difficulty.  Hematological:  Bruises/bleeds easily.  Psychiatric/Behavioral: Negative.  Negative for depression and sleep disturbance. The patient is not nervous/anxious.      VITALS:  Blood pressure 125/60, pulse 63, temperature 97.9 F (36.6 C), temperature source Oral, resp. rate 18, height 5' (1.524 m), weight 111 lb 6.4 oz (50.5 kg), SpO2 98 %.  Wt Readings from Last 3 Encounters:  12/16/21 111 lb 6.4 oz (50.5 kg)  12/01/21 112 lb (50.8 kg)  10/12/21 112 lb 8 oz (51 kg)    Body mass index is 21.76 kg/m.  Performance status (ECOG): 1 - Symptomatic but completely ambulatory  PHYSICAL EXAM:  Physical Exam Constitutional:      General: She is not in acute distress.    Appearance: Normal appearance. She is normal weight.  HENT:     Head: Normocephalic and atraumatic.  Eyes:     General: No scleral icterus.    Extraocular Movements: Extraocular movements intact.     Conjunctiva/sclera: Conjunctivae normal.     Pupils: Pupils are equal, round, and reactive to light.  Cardiovascular:     Rate and Rhythm: Normal rate and regular rhythm.     Pulses: Normal pulses.     Heart sounds: Normal heart sounds. No murmur heard.   No friction rub. No gallop.   Pulmonary:     Effort: Pulmonary effort is normal. No respiratory distress.     Breath sounds: Normal breath sounds.  Chest:     Comments: Natural nipples but implants and reconstructions of both breasts. Abdominal:     General: Bowel sounds are normal. There is no distension.     Palpations: Abdomen is soft. There is no hepatomegaly, splenomegaly or mass.     Tenderness: There is no abdominal tenderness.  Musculoskeletal:        General: Normal range of motion.     Cervical back: Normal range of motion and neck supple.     Right lower leg: No edema.     Left lower leg: No edema.     Comments: Prominent vertebrae in the mid thoracic area and some kyphosis  Lymphadenopathy:     Cervical: No cervical adenopathy.  Skin:    General: Skin is warm and dry.  Neurological:     General: No focal deficit present.     Mental Status: She is alert and oriented to person, place, and time. Mental status is at baseline.  Psychiatric:        Mood and Affect: Mood normal.        Behavior: Behavior normal.        Thought Content: Thought content normal.        Judgment: Judgment normal.     LABS:   CBC Latest Ref Rng & Units 12/16/2021 10/12/2021 09/05/2020  WBC - 2.9 3.6(L) 4.9  Hemoglobin 12.0 - 16.0 13.2 13.0 12.7  Hematocrit 36 - 46 39 38.9 38.2  Platelets 150 - 399 119(A) 137.0(L) 154.0   CMP Latest Ref Rng & Units 10/12/2021 09/05/2020 10/26/2018  Glucose 70 - 99 mg/dL 70 91 91  BUN 6 -  23 mg/dL '22 16 22  ' Creatinine 0.40 - 1.20 mg/dL 0.89 0.92 0.82  Sodium 135 - 145 mEq/L 140 140 143  Potassium 3.5 - 5.1 mEq/L 4.2 4.3 4.2  Chloride 96 - 112 mEq/L 103 105 106  CO2 19 - 32 mEq/L '30 29 30  ' Calcium 8.4 - 10.5 mg/dL 9.6 9.3 9.4  Total Protein 6.0 - 8.3 g/dL 6.4 6.5 6.2  Total Bilirubin 0.2 - 1.2 mg/dL 0.6 0.3 0.3  Alkaline Phos 39 - 117 U/L 94 97 94  AST 0 - 37 U/L '26 28 24  ' ALT 0 - 35 U/L 28 42(H) 31    No results found for: TOTALPROTELP, ALBUMINELP, A1GS, A2GS, BETS, BETA2SER,  GAMS, MSPIKE, SPEI Lab Results  Component Value Date   FERRITIN 56.2 11/03/2018   IRONPCTSAT 30.1 11/03/2018   No results found for: LDH  Peripheral blood smear is unremarkable. STUDIES:  No results found.    EXAM: 04/16/2015 CT CHEST WITH CONTRAST   TECHNIQUE:  Multidetector CT imaging of the chest was performed during  intravenous contrast administration.   CONTRAST: 60 ml Isovue 370.   COMPARISON: Chest radiographs 03/18/2015. Chest CT 10/13/2012.   FINDINGS:  Mediastinum/Nodes: There are no enlarged mediastinal, hilar or  axillary lymph nodes. The thyroid gland, trachea and esophagus  demonstrate no significant findings. The heart size is normal. There  is no pericardial effusion.There are no significant vascular  findings.  Lungs/Pleura: There is no pleural effusion. Minimal emphysematous  changes are noted. There is a stable 4 mm perifissural nodule along  the minor fissure on image number 25. No new, enlarging or  suspicious nodules demonstrated.  Upper abdomen: Unremarkable. There is no adrenal mass.  Musculoskeletal/Chest wall: Bilateral breast implants noted. No  chest wall mass or suspicious osseous findings. The bones are  demineralized. There is a stable small bone island within the T6  vertebral body.   IMPRESSION:  1. No acute findings or explanation for the patient's symptoms.  2. Stable small perifissural nodule along the minor fissure from  2013. This is consistent with a benign finding, likely a lymph node.    I, Rita Ohara, am acting as scribe for Rebecca Kaplan, MD  I have reviewed this report as typed by the medical scribe, and it is complete and accurate.

## 2021-12-17 ENCOUNTER — Telehealth: Payer: Self-pay

## 2021-12-17 NOTE — Telephone Encounter (Signed)
-----   Message from Derwood Kaplan, MD sent at 12/17/2021  9:11 AM EST ----- Regarding: call Can tell her hepatitis testing is negative, and vitamin levels are good

## 2021-12-17 NOTE — Telephone Encounter (Signed)
Patient notified

## 2021-12-18 LAB — PROTEIN ELECTROPHORESIS, SERUM
A/G Ratio: 1.6 (ref 0.7–1.7)
Albumin ELP: 3.9 g/dL (ref 2.9–4.4)
Alpha-1-Globulin: 0.2 g/dL (ref 0.0–0.4)
Alpha-2-Globulin: 0.7 g/dL (ref 0.4–1.0)
Beta Globulin: 0.8 g/dL (ref 0.7–1.3)
Gamma Globulin: 0.7 g/dL (ref 0.4–1.8)
Globulin, Total: 2.5 g/dL (ref 2.2–3.9)
Total Protein ELP: 6.4 g/dL (ref 6.0–8.5)

## 2021-12-28 DIAGNOSIS — M546 Pain in thoracic spine: Secondary | ICD-10-CM | POA: Diagnosis not present

## 2021-12-28 NOTE — Progress Notes (Signed)
[place]  [address] [place],  [place]  [address] [phone]  Clinic Day:  01/01/2022  Referring physician: Townsend Roger, MD  This document serves as a record of services personally performed by Hosie Poisson, MD. It was created on their behalf by [facility], a trained medical scribe. The creation of this record is based on the scribe's personal observations and the provider's statements to them.  ASSESSMENT & PLAN:   Thrombocytopenia, mild, and currently resolved. She has been running between 130-140,000. Evaluation was negative. If her counts continue to fall, we will need to consider bone marrow evaluation.  Leukopenia with neutropenia, improved. This has been fluctuating up and down. This could represent cyclic neutropenia, but we wouldn't expect thrombocytopenia with that.  There was no evidence of nutritional deficiency or multiple myeloma, so this is likely to be myelodysplasia, which would be diagnosed with a bone marrow.  Chronic back pain secondary to fractures of the vertebrae after a fall in December 2021. For completeness, we will obtain bone density as it has been many years since her last imaging. She is not currently on oral calcium or vitamin D.   Strong family history of malignancy. She has undergone genetic testing with BRCA and Myriad Myrisk panel in July 2021 which was negative.  Stable small perifissural nodule along the minor fissure from 2013. This is consistent with a benign finding, likely a lymph node.   Poor appetite and weight loss. She is currently on trazodone 50 mg at bedtime. I will have her discontinue this medication and will try her on Remeron 15 mg at bedtime as this will both help her sleep and also improve her appetite. We may need to increase the dose to 30 mg.  Her counts have improved today with her white count up to 3.4 and platelets up to 141,000. This may be representative of MDS as thorough  evaluation was otherwise negative. The only way to diagnose this would be to pursue a bone marrow evaluation, however, we will not pursue this at this time as it would not change our plan of care. At this time, we plan for surveillance and so we will see her back in 2 months with CBC and CMP for repeat evaluation. As above, I will have her discontinue trazodone and will place her on Remeron 15 mg at bedtime. She and her husband understand and agree with this plan of care. I have answered her questions and she knows to call with any concerns.  I provided 15 minutes of face-to-face time during this this encounter and > 50% was spent counseling as documented under my assessment and plan.    Derwood Kaplan, MD [place] [address] [place] [address] Dept: [phone] Dept Fax: [phone]    CHIEF COMPLAINT:  CC: Mild thrombocytopenia and mild leukopenia  Current Treatment:  Surveillance   HISTORY OF PRESENT ILLNESS:  Rebecca [name] is a 76 y.o. female referred by Dr. Vassie Loll Eyk for the evaluation and treatment of mild thrombocytopenia and leukopenia. She was initially found to have thrombocytopenia back in July 2022 with a platelet count of 137,000. By October her platelet count was 141,000, white count was 3.3 with an ANC of 1600, and hemoglobin was normal at 12.6. Chemistries were unremarkable. She underwent GI evaluation with Dr. Lyndel Safe in December, and gastric polyps were seen and biopsies were benign. She also has atrophic gastritis  and has been placed on Carafate. Labs at that time showed a white count of 3.6 with an ANC of 2200, platelets were 137,000, and hemoglobin was normal at 13.0.  She states that her white count has been low in the past. She does have a significant family history of malignancy, and states that she did undergo genetic testing a few years ago which was negative. She has had 95% of her  breast tissue removed and has had bilateral reconstructions. She underwent partial hysterectomy in 1984 due to menorrhagia, and was on hormone replacement for a short time.  INTERVAL HISTORY:  I have reviewed her chart and materials related to her cancer extensively and collaborated history with the patient. Summary of oncologic history is as follows: Oncology History   No history exists.    Rebecca [name] is here for routine follow up and states that she is doing well and denies complaints other than poor appetite. She does take trazodone to help her sleep at night. White count has improved from 2.9 to 3.4 with an ANC of 1900, improved, hemoglobin is normal at 13.1, and platelets have normalized to 141,000. Chemistries are unremarkable except for a BUN of 20. Her  appetite is poor, and her weight is fairly stable since her last visit.  She denies fever, chills or other signs of infection.  She denies nausea, vomiting, bowel issues, or abdominal pain.  She denies sore throat, cough, dyspnea, or chest pain.  HISTORY:   Allergies:  Allergies  Allergen Reactions   Ciprofloxacin    Zonisamide     Unsure about this medication    Codeine Nausea Only   Nitrofurantoin Nausea Only    Current Medications: Current Outpatient Medications  Medication Sig Dispense Refill   cyclobenzaprine (FLEXERIL) 10 MG tablet Take 10 mg by mouth 3 (three) times daily as needed for muscle spasms.     Dexlansoprazole (DEXILANT) 30 MG capsule DR Take 1 capsule (30 mg total) by mouth daily. 90 capsule 3   Erenumab-aooe (AIMOVIG [place]) Inject into the skin every 30 (thirty) days.     metoprolol tartrate (LOPRESSOR) 25 MG tablet Take 3 tablets by mouth 2 (two) times daily.     pravastatin (PRAVACHOL) 40 MG tablet Take 40 mg by mouth at bedtime.     traMADol (ULTRAM) 50 MG tablet Take 50 mg by mouth every 8 (eight) hours as needed.     traZODone (DESYREL) 50 MG tablet Take 50 mg by mouth at bedtime.     Current  Facility-Administered Medications  Medication Dose Route Frequency Provider Last Rate Last Admin   0.9 %  sodium chloride infusion  500 mL Intravenous Once Jackquline Denmark, MD        REVIEW OF SYSTEMS:  Review of Systems  Constitutional:  Positive for appetite change (poor). Negative for chills, fatigue, fever and unexpected weight change.  HENT:  Negative.    Eyes: Negative.   Respiratory: Negative.  Negative for chest tightness, cough, hemoptysis, shortness of breath and wheezing.   Cardiovascular: Negative.  Negative for chest pain, leg swelling and palpitations.  Gastrointestinal: Negative.  Negative for abdominal distention, abdominal pain, blood in stool, constipation, diarrhea, nausea and vomiting.  Endocrine: Negative.   Genitourinary: Negative.  Negative for difficulty urinating, dysuria, frequency and hematuria.   Musculoskeletal: Negative.  Negative for arthralgias, back pain, flank pain, gait problem and myalgias.  Skin: Negative.   Neurological: Negative.  Negative for dizziness, extremity weakness, gait problem, headaches, light-headedness, numbness, seizures and speech difficulty.  Hematological: Negative.   Psychiatric/Behavioral: Negative.  Negative for depression and sleep disturbance. The patient is not nervous/anxious.      VITALS:  Blood pressure (!) 119/56, pulse 62, temperature 98.3 F (36.8 C), temperature source Oral, resp. rate 18, height 5' (1.524 m), weight 111 lb 1.6 oz (50.4 kg), SpO2 97 %.  Wt Readings from Last 3 Encounters:  01/01/22 111 lb 1.6 oz (50.4 kg)  12/16/21 111 lb 6.4 oz (50.5 kg)  12/01/21 112 lb (50.8 kg)    Body mass index is 21.7 kg/m.  Performance status (ECOG): 1 - Symptomatic but completely ambulatory  PHYSICAL EXAM:  Physical Exam deferred   LABS:   CBC Latest Ref Rng & Units 01/01/2022 12/16/2021 10/12/2021  WBC - 3.4 2.9 3.6(L)  Hemoglobin 12.0 - 16.0 13.1 13.2 13.0  Hematocrit 36 - 46 39 39 38.9  Platelets 150 - 399 141(A)  119(A) 137.0(L)   CMP Latest Ref Rng & Units 01/01/2022 10/12/2021 09/05/2020  Glucose 70 - 99 mg/dL - 70 91  BUN 4 - _0 Creatinine 0.5 - 1.1 0.8 0.89 0.92  Sodium 137 - 147 142 140 140  Potassium 3.4 - 5.3 4.2 4.2 4.3  Chloride 99 - 108 106 103 105  CO2 13 - 22 29(A) 30 29  Calcium 8.7 - 10.7 9.1 9.6 9.3  Total Protein 6.0 - 8.3 g/dL - 6.4 6.5  Total Bilirubin 0.2 - 1.2 mg/dL - 0.6 0.3  Alkaline Phos 25 - 125 78 94 97  AST 13 - 35 33 26 28  ALT 7 - 35 32 28 42(H)    Lab Results  Component Value Date   TOTALPROTELP 6.4 12/16/2021   ALBUMINELP 3.9 12/16/2021   A1GS 0.2 12/16/2021   A2GS 0.7 12/16/2021   BETS 0.8 12/16/2021   GAMS 0.7 12/16/2021   MSPIKE Not Observed 12/16/2021   SPEI Comment 12/16/2021   Lab Results  Component Value Date   FERRITIN 56.2 11/03/2018   IRONPCTSAT 30.1 11/03/2018   No results found for: LDH   STUDIES:  No results found.     I, Rita Ohara, am acting as scribe for Derwood Kaplan, MD  I have reviewed this report as typed by the medical scribe, and it is complete and accurate.

## 2022-01-01 ENCOUNTER — Inpatient Hospital Stay (INDEPENDENT_AMBULATORY_CARE_PROVIDER_SITE_OTHER): Payer: Medicare Other | Admitting: Oncology

## 2022-01-01 ENCOUNTER — Inpatient Hospital Stay: Payer: Medicare Other

## 2022-01-01 ENCOUNTER — Telehealth: Payer: Self-pay | Admitting: Oncology

## 2022-01-01 ENCOUNTER — Encounter: Payer: Self-pay | Admitting: Oncology

## 2022-01-01 ENCOUNTER — Other Ambulatory Visit: Payer: Self-pay | Admitting: Oncology

## 2022-01-01 ENCOUNTER — Other Ambulatory Visit: Payer: Self-pay

## 2022-01-01 ENCOUNTER — Telehealth: Payer: Self-pay

## 2022-01-01 VITALS — BP 119/56 | HR 62 | Temp 98.3°F | Resp 18 | Ht 60.0 in | Wt 111.1 lb

## 2022-01-01 DIAGNOSIS — F32A Depression, unspecified: Secondary | ICD-10-CM

## 2022-01-01 DIAGNOSIS — D696 Thrombocytopenia, unspecified: Secondary | ICD-10-CM

## 2022-01-01 DIAGNOSIS — D72819 Decreased white blood cell count, unspecified: Secondary | ICD-10-CM

## 2022-01-01 DIAGNOSIS — N895 Stricture and atresia of vagina: Secondary | ICD-10-CM | POA: Insufficient documentation

## 2022-01-01 DIAGNOSIS — D649 Anemia, unspecified: Secondary | ICD-10-CM | POA: Insufficient documentation

## 2022-01-01 LAB — BASIC METABOLIC PANEL
BUN: 20 (ref 4–21)
CO2: 29 — AB (ref 13–22)
Chloride: 106 (ref 99–108)
Creatinine: 0.8 (ref 0.5–1.1)
Glucose: 71
Potassium: 4.2 (ref 3.4–5.3)
Sodium: 142 (ref 137–147)

## 2022-01-01 LAB — COMPREHENSIVE METABOLIC PANEL
Albumin: 4.1 (ref 3.5–5.0)
Calcium: 9.1 (ref 8.7–10.7)

## 2022-01-01 LAB — CBC AND DIFFERENTIAL
HCT: 39 (ref 36–46)
Hemoglobin: 13.1 (ref 12.0–16.0)
Neutrophils Absolute: 1.9
Platelets: 141 — AB (ref 150–399)
WBC: 3.4

## 2022-01-01 LAB — HEPATIC FUNCTION PANEL
ALT: 32 (ref 7–35)
AST: 33 (ref 13–35)
Alkaline Phosphatase: 78 (ref 25–125)
Bilirubin, Total: 0.5

## 2022-01-01 LAB — CBC: RBC: 4.16 (ref 3.87–5.11)

## 2022-01-01 MED ORDER — MIRTAZAPINE 15 MG PO TABS: 15.0000 mg | ORAL_TABLET | Freq: Every day | ORAL | 5 refills | Status: DC

## 2022-01-01 NOTE — Telephone Encounter (Signed)
Per 01/01/22 los next appt scheduled and confirmed with patient °

## 2022-01-01 NOTE — Telephone Encounter (Signed)
Called Dr. Sharren Bridge office, [place] notified. Wants office note faxed to them.

## 2022-01-01 NOTE — Telephone Encounter (Signed)
-----   Message from Derwood Kaplan, MD sent at 01/01/2022  1:02 PM EST ----- Regarding: med Would you notify Dr. Doran Durand office that I am going to stop the trazodone and try her on Remeron 15 mg hs to see if it will help her appetite.  I don't have explanation for her thrombocytopenia and leukopenia but both improved today.I suspect myelodysplasia (MDS) but rec observation only for now

## 2022-01-04 ENCOUNTER — Other Ambulatory Visit: Payer: Self-pay | Admitting: Oncology

## 2022-01-04 ENCOUNTER — Telehealth: Payer: Self-pay

## 2022-01-04 DIAGNOSIS — F32A Depression, unspecified: Secondary | ICD-10-CM

## 2022-01-04 MED ORDER — MIRTAZAPINE 30 MG PO TABS: 30.0000 mg | ORAL_TABLET | Freq: Every day | ORAL | 5 refills | Status: DC

## 2022-01-04 NOTE — Telephone Encounter (Addendum)
Pt notified of below. She verbalized understanding.  ----- Message from Derwood Kaplan, MD sent at 01/04/2022  3:33 PM EST ----- Regarding: RE: Remeron Yes, I started her on a low dose of 15 mg hs so let's increase to 30 mg hs.  I will send in Rx. ----- Message ----- From: Dairl Ponder, RN Sent: 01/04/2022   3:10 PM EST To: Derwood Kaplan, MD Subject: Remeron                                        Pt called to report she took the Remeron you ordered and she was able to sleep only about 4 hours. She still had a hard time falling asleep. She is asking if the Remeron can be increased or maybe a different medication if you wanted?

## 2022-01-26 DIAGNOSIS — M546 Pain in thoracic spine: Secondary | ICD-10-CM | POA: Diagnosis not present

## 2022-01-29 DIAGNOSIS — G518 Other disorders of facial nerve: Secondary | ICD-10-CM | POA: Diagnosis not present

## 2022-01-29 DIAGNOSIS — G43719 Chronic migraine without aura, intractable, without status migrainosus: Secondary | ICD-10-CM | POA: Diagnosis not present

## 2022-01-29 DIAGNOSIS — G43019 Migraine without aura, intractable, without status migrainosus: Secondary | ICD-10-CM | POA: Diagnosis not present

## 2022-01-29 DIAGNOSIS — M791 Myalgia, unspecified site: Secondary | ICD-10-CM | POA: Diagnosis not present

## 2022-01-29 DIAGNOSIS — M542 Cervicalgia: Secondary | ICD-10-CM | POA: Diagnosis not present

## 2022-02-10 DIAGNOSIS — M546 Pain in thoracic spine: Secondary | ICD-10-CM | POA: Diagnosis not present

## 2022-02-19 DIAGNOSIS — I10 Essential (primary) hypertension: Secondary | ICD-10-CM | POA: Diagnosis not present

## 2022-02-24 DIAGNOSIS — M546 Pain in thoracic spine: Secondary | ICD-10-CM | POA: Diagnosis not present

## 2022-02-27 NOTE — Progress Notes (Signed)
[name]  ?[address] ?[place],  [place]  [address] ?[phone] ? ?Clinic Day:  03/01/22 ? ?Referring physician: Townsend Roger, MD ? ?ASSESSMENT & PLAN:  ? ?Thrombocytopenia, mild, and currently resolved. She has been running between 130-140,000. Evaluation was negative. If her counts start to fall again, we and consider bone marrow evaluation, but her blood count abnormalities are very mild at this time. ? ?Leukopenia with neutropenia, improved. This has been fluctuating up and down. This could represent cyclic neutropenia, but we wouldn't expect thrombocytopenia with that.  There was no evidence of nutritional deficiency or multiple myeloma, so this is likely to be myelodysplasia, which would be diagnosed with a bone marrow. ? ?Chronic back pain secondary to fractures of the vertebrae after a fall in December 2021. For completeness, we will obtain bone density as it has been many years since her last imaging. She is not currently on oral calcium or vitamin D.  ? ?Strong family history of malignancy. She has undergone genetic testing with BRCA and Myriad Myrisk panel in July 2021 which was negative. ? ?5.   Poor appetite and weight loss. She is currently on trazodone 75 mg at bedtime and drinking 3 ensures daily. She has gained 3-1/2 pounds. ? ?Her counts are stable today with her white count at 3.3 and platelets up to 134,000. This may be representative of MDS as thorough evaluation was otherwise negative. The only way to diagnose this would be to pursue a bone marrow evaluation, however, we will not pursue this at this time as it would not change our plan of care.  Her blood count abnormalities are very mild and so we will plan for surveillance only.  I will see her back in 6 months with CBC and CMP for repeat evaluation. She understands and agrees with this plan of care. I have answered her questions and she knows to call with any concerns. ? ?I provided 15 minutes of  face-to-face time during this this encounter and > 50% was spent counseling as documented under my assessment and plan.  ? ? ?Derwood Kaplan, MD ?[facility] ?[place] [address] ?Dept: [phone] ?Dept Fax: [phone]  ? ? ?CHIEF COMPLAINT:  ?CC: Mild thrombocytopenia and mild leukopenia ? ?Current Treatment:  Surveillance ? ? ?HISTORY OF PRESENT ILLNESS:  ?Rebecca [name] is a 76 y.o. female referred by Dr. Vassie Loll Eyk for the evaluation and treatment of mild thrombocytopenia and leukopenia. She was initially found to have thrombocytopenia back in July 2022 with a platelet count of 137,000. By October her platelet count was 141,000, white count was 3.3 with an ANC of 1600, and hemoglobin was normal at 12.6. Chemistries were unremarkable. She underwent GI evaluation with Dr. Lyndel Safe in December, and gastric polyps were seen and biopsies were benign. She also has atrophic gastritis and has been placed on Carafate. Labs at that time showed a white count of 3.6 with an ANC of 2200, platelets were 137,000, and hemoglobin was normal at 13.0.  She states that her white count has been low in the past. She does have a significant family history of malignancy, and states that she did undergo genetic testing a few years ago which was negative. She has had 95% of her breast tissue removed and has had bilateral reconstructions. She underwent partial hysterectomy in 1984 due to menorrhagia, and was on hormone replacement for a short time. ? ?  INTERVAL HISTORY:  ?I have reviewed her chart and materials related to her blood extensively and collaborated history with the patient. ?Oncology History  ? No history exists.  ? ? ?Ashaunte is here for routine follow up and states that she is doing well and denies complaints. She does take trazodone 75 mg to help her sleep at night.  She had been losing weight and so I suggested trying Remeron at  bedtime but she did not do well with this and went back to trazodone but at an increased dose of 75 mg.  White count is stable at 3.3 with an Parker of 1820, and platelets remain normal at 134,000. Chemistries reveal a BUN of 20, and abnormal liver transaminases elevated with an SGOT of 59 and SGPT of 77.  This is a new finding and will need to be monitored.  She saw Dr. Nona Dell earlier this month and he will see her again in July.  I had recommended a bone density scan and ordered it but it has not been scheduled yet. Her  appetite is poor, but she is drinking 3 ensures daily and has gained 3-1/2 pounds since her last visit.  She denies fever, chills or other signs of infection.  She denies nausea, vomiting, bowel issues, or abdominal pain.  She denies sore throat, cough, dyspnea, or chest pain. ? ?HISTORY:  ? ?Allergies:  ?Allergies  ?Allergen Reactions  ? Ciprofloxacin   ? Zonisamide   ?  Unsure about this medication ?  ? Codeine Nausea Only  ? Nitrofurantoin Nausea Only  ? ? ?Current Medications: ?Current Outpatient Medications  ?Medication Sig Dispense Refill  ? Dexlansoprazole (DEXILANT) 30 MG capsule DR Take 1 capsule (30 mg total) by mouth daily. 90 capsule 3  ? Erenumab-aooe (AIMOVIG [place]) Inject into the skin every 30 (thirty) days.    ? metoprolol tartrate (LOPRESSOR) 25 MG tablet Take 3 tablets by mouth 2 (two) times daily.    ? mirtazapine (REMERON) 30 MG tablet Take 1 tablet (30 mg total) by mouth at bedtime. 30 tablet 5  ? pravastatin (PRAVACHOL) 40 MG tablet Take 40 mg by mouth at bedtime.    ? traMADol (ULTRAM) 50 MG tablet Take 50 mg by mouth every 8 (eight) hours as needed.    ? traZODone (DESYREL) 50 MG tablet Take 75 mg by mouth at bedtime.    ? cyclobenzaprine (FLEXERIL) 10 MG tablet Take 10 mg by mouth 3 (three) times daily as needed for muscle spasms.    ? ?Current Facility-Administered Medications  ?Medication Dose Route Frequency Provider Last Rate Last Admin  ? 0.9 %  sodium chloride infusion   500 mL Intravenous Once Jackquline Denmark, MD      ? ? ?REVIEW OF SYSTEMS:  ?Review of Systems  ?Constitutional:  Negative for chills, fatigue, fever and unexpected weight change. Appetite change: poor. ?HENT:  Negative.    ?Eyes: Negative.   ?Respiratory: Negative.  Negative for chest tightness, cough, hemoptysis, shortness of breath and wheezing.   ?Cardiovascular: Negative.  Negative for chest pain, leg swelling and palpitations.  ?Gastrointestinal: Negative.  Negative for abdominal distention, abdominal pain, blood in stool, constipation, diarrhea, nausea and vomiting.  ?Endocrine: Negative.   ?Genitourinary: Negative.  Negative for difficulty urinating, dysuria, frequency and hematuria.   ?Musculoskeletal: Negative.  Negative for arthralgias, back pain, flank pain, gait problem and myalgias.  ?Skin: Negative.   ?Neurological: Negative.  Negative for dizziness, extremity weakness, gait problem, headaches, light-headedness, numbness, seizures and speech difficulty.  ?  Hematological: Negative.   ?Psychiatric/Behavioral: Negative.  Negative for depression and sleep disturbance. The patient is not nervous/anxious.    ? ? ?VITALS:  ?Blood pressure (!) 107/56, pulse 74, temperature 97.8 ?F (36.6 ?C), temperature source Oral, resp. rate 18, height 5' (1.524 m), weight 114 lb 8 oz (51.9 kg), SpO2 98 %.  ?Wt Readings from Last 3 Encounters:  ?03/01/22 114 lb 8 oz (51.9 kg)  ?01/01/22 111 lb 1.6 oz (50.4 kg)  ?12/16/21 111 lb 6.4 oz (50.5 kg)  ?  ?Body mass index is 22.36 kg/m?. ? ?Performance status (ECOG): 1 - Symptomatic but completely ambulatory ? ?PHYSICAL EXAM:  ?Physical Exam ?Constitutional:   ?   Appearance: Normal appearance.  ?HENT:  ?   Head: Normocephalic and atraumatic.  ?   Nose: Nose normal.  ?   Mouth/Throat:  ?   Pharynx: Oropharynx is clear.  ?Eyes:  ?   Extraocular Movements: Extraocular movements intact.  ?   Conjunctiva/sclera: Conjunctivae normal.  ?   Pupils: Pupils are equal, round, and reactive to  light.  ?Cardiovascular:  ?   Rate and Rhythm: Normal rate and regular rhythm.  ?   Heart sounds: Normal heart sounds.  ?Pulmonary:  ?   Breath sounds: Normal breath sounds.  ?Abdominal:  ?   General: Bowel so

## 2022-03-01 ENCOUNTER — Encounter: Payer: Self-pay | Admitting: Oncology

## 2022-03-01 ENCOUNTER — Inpatient Hospital Stay: Payer: Medicare Other

## 2022-03-01 ENCOUNTER — Other Ambulatory Visit: Payer: Self-pay | Admitting: Oncology

## 2022-03-01 ENCOUNTER — Telehealth: Payer: Self-pay | Admitting: Oncology

## 2022-03-01 ENCOUNTER — Other Ambulatory Visit: Payer: Self-pay

## 2022-03-01 ENCOUNTER — Inpatient Hospital Stay: Payer: Medicare Other | Attending: Oncology | Admitting: Oncology

## 2022-03-01 VITALS — BP 107/56 | HR 74 | Temp 97.8°F | Resp 18 | Ht 60.0 in | Wt 114.5 lb

## 2022-03-01 DIAGNOSIS — D72819 Decreased white blood cell count, unspecified: Secondary | ICD-10-CM | POA: Diagnosis not present

## 2022-03-01 DIAGNOSIS — D696 Thrombocytopenia, unspecified: Secondary | ICD-10-CM | POA: Diagnosis not present

## 2022-03-01 LAB — CBC AND DIFFERENTIAL
HCT: 41 (ref 36–46)
Hemoglobin: 13.3 (ref 12.0–16.0)
Neutrophils Absolute: 1.82
Platelets: 134 10*3/uL — AB (ref 150–400)
WBC: 3.3

## 2022-03-01 LAB — BASIC METABOLIC PANEL
BUN: 20 (ref 4–21)
CO2: 32 — AB (ref 13–22)
Chloride: 104 (ref 99–108)
Creatinine: 0.7 (ref 0.5–1.1)
Glucose: 100
Potassium: 4 mEq/L (ref 3.5–5.1)
Sodium: 142 (ref 137–147)

## 2022-03-01 LAB — HEPATIC FUNCTION PANEL
ALT: 77 U/L — AB (ref 7–35)
AST: 59 — AB (ref 13–35)
Alkaline Phosphatase: 102 (ref 25–125)
Bilirubin, Total: 0.5

## 2022-03-01 LAB — COMPREHENSIVE METABOLIC PANEL
Albumin: 4.5 (ref 3.5–5.0)
Calcium: 9.4 (ref 8.7–10.7)
Calcium: 9.4 (ref 8.7–10.7)

## 2022-03-01 LAB — CBC: RBC: 4.39 (ref 3.87–5.11)

## 2022-03-01 NOTE — Telephone Encounter (Signed)
Per 03/01/22 los next appt scheduled and confirmed with patient ?

## 2022-03-03 DIAGNOSIS — M8589 Other specified disorders of bone density and structure, multiple sites: Secondary | ICD-10-CM | POA: Diagnosis not present

## 2022-03-03 DIAGNOSIS — M810 Age-related osteoporosis without current pathological fracture: Secondary | ICD-10-CM | POA: Diagnosis not present

## 2022-03-11 DIAGNOSIS — M546 Pain in thoracic spine: Secondary | ICD-10-CM | POA: Diagnosis not present

## 2022-03-23 DIAGNOSIS — M810 Age-related osteoporosis without current pathological fracture: Secondary | ICD-10-CM | POA: Diagnosis not present

## 2022-03-24 DIAGNOSIS — M546 Pain in thoracic spine: Secondary | ICD-10-CM | POA: Diagnosis not present

## 2022-04-01 DIAGNOSIS — M542 Cervicalgia: Secondary | ICD-10-CM | POA: Diagnosis not present

## 2022-04-01 DIAGNOSIS — R519 Headache, unspecified: Secondary | ICD-10-CM | POA: Diagnosis not present

## 2022-04-02 ENCOUNTER — Other Ambulatory Visit: Payer: Self-pay | Admitting: Internal Medicine

## 2022-04-02 DIAGNOSIS — R519 Headache, unspecified: Secondary | ICD-10-CM

## 2022-04-08 DIAGNOSIS — M810 Age-related osteoporosis without current pathological fracture: Secondary | ICD-10-CM | POA: Diagnosis not present

## 2022-04-23 ENCOUNTER — Ambulatory Visit
Admission: RE | Admit: 2022-04-23 | Discharge: 2022-04-23 | Disposition: A | Discharge status: Home / Self Care | Payer: Medicare Other | Source: Ambulatory Visit | Attending: Internal Medicine | Admitting: Internal Medicine

## 2022-04-23 DIAGNOSIS — R519 Headache, unspecified: Secondary | ICD-10-CM

## 2022-04-30 DIAGNOSIS — M5030 Other cervical disc degeneration, unspecified cervical region: Secondary | ICD-10-CM | POA: Diagnosis not present

## 2022-04-30 DIAGNOSIS — G4486 Cervicogenic headache: Secondary | ICD-10-CM | POA: Diagnosis not present

## 2022-04-30 DIAGNOSIS — M4850XA Collapsed vertebra, not elsewhere classified, site unspecified, initial encounter for fracture: Secondary | ICD-10-CM | POA: Diagnosis not present

## 2022-04-30 DIAGNOSIS — M810 Age-related osteoporosis without current pathological fracture: Secondary | ICD-10-CM | POA: Diagnosis not present

## 2022-05-20 DIAGNOSIS — Z01419 Encounter for gynecological examination (general) (routine) without abnormal findings: Secondary | ICD-10-CM | POA: Diagnosis not present

## 2022-05-21 DIAGNOSIS — I10 Essential (primary) hypertension: Secondary | ICD-10-CM | POA: Diagnosis not present

## 2022-05-21 DIAGNOSIS — D72819 Decreased white blood cell count, unspecified: Secondary | ICD-10-CM | POA: Diagnosis not present

## 2022-05-21 DIAGNOSIS — S22009A Unspecified fracture of unspecified thoracic vertebra, initial encounter for closed fracture: Secondary | ICD-10-CM | POA: Diagnosis not present

## 2022-05-21 DIAGNOSIS — G4700 Insomnia, unspecified: Secondary | ICD-10-CM | POA: Diagnosis not present

## 2022-05-21 DIAGNOSIS — M810 Age-related osteoporosis without current pathological fracture: Secondary | ICD-10-CM | POA: Diagnosis not present

## 2022-05-21 DIAGNOSIS — E7800 Pure hypercholesterolemia, unspecified: Secondary | ICD-10-CM | POA: Diagnosis not present

## 2022-05-21 DIAGNOSIS — I700 Atherosclerosis of aorta: Secondary | ICD-10-CM | POA: Diagnosis not present

## 2022-05-21 DIAGNOSIS — D696 Thrombocytopenia, unspecified: Secondary | ICD-10-CM | POA: Diagnosis not present

## 2022-05-21 DIAGNOSIS — K219 Gastro-esophageal reflux disease without esophagitis: Secondary | ICD-10-CM | POA: Diagnosis not present

## 2022-05-21 DIAGNOSIS — G43009 Migraine without aura, not intractable, without status migrainosus: Secondary | ICD-10-CM | POA: Diagnosis not present

## 2022-05-21 DIAGNOSIS — Z6822 Body mass index (BMI) 22.0-22.9, adult: Secondary | ICD-10-CM | POA: Diagnosis not present

## 2022-05-21 DIAGNOSIS — Z0000 Encounter for general adult medical examination without abnormal findings: Secondary | ICD-10-CM | POA: Diagnosis not present

## 2022-05-24 DIAGNOSIS — M5481 Occipital neuralgia: Secondary | ICD-10-CM | POA: Diagnosis not present

## 2022-05-25 ENCOUNTER — Other Ambulatory Visit: Payer: Self-pay | Admitting: Obstetrics and Gynecology

## 2022-05-25 DIAGNOSIS — Z1231 Encounter for screening mammogram for malignant neoplasm of breast: Secondary | ICD-10-CM

## 2022-05-26 DIAGNOSIS — M542 Cervicalgia: Secondary | ICD-10-CM | POA: Diagnosis not present

## 2022-05-26 DIAGNOSIS — G43019 Migraine without aura, intractable, without status migrainosus: Secondary | ICD-10-CM | POA: Diagnosis not present

## 2022-05-26 DIAGNOSIS — G518 Other disorders of facial nerve: Secondary | ICD-10-CM | POA: Diagnosis not present

## 2022-05-26 DIAGNOSIS — M791 Myalgia, unspecified site: Secondary | ICD-10-CM | POA: Diagnosis not present

## 2022-05-26 DIAGNOSIS — G43719 Chronic migraine without aura, intractable, without status migrainosus: Secondary | ICD-10-CM | POA: Diagnosis not present

## 2022-06-03 DIAGNOSIS — R0602 Shortness of breath: Secondary | ICD-10-CM | POA: Diagnosis not present

## 2022-06-23 ENCOUNTER — Ambulatory Visit
Admission: RE | Admit: 2022-06-23 | Discharge: 2022-06-23 | Disposition: A | Discharge status: Home / Self Care | Payer: Medicare Other | Source: Ambulatory Visit | Attending: Obstetrics and Gynecology | Admitting: Obstetrics and Gynecology

## 2022-06-23 DIAGNOSIS — M791 Myalgia, unspecified site: Secondary | ICD-10-CM | POA: Diagnosis not present

## 2022-06-23 DIAGNOSIS — Z1231 Encounter for screening mammogram for malignant neoplasm of breast: Secondary | ICD-10-CM | POA: Diagnosis not present

## 2022-06-23 DIAGNOSIS — G518 Other disorders of facial nerve: Secondary | ICD-10-CM | POA: Diagnosis not present

## 2022-06-23 DIAGNOSIS — M542 Cervicalgia: Secondary | ICD-10-CM | POA: Diagnosis not present

## 2022-06-23 DIAGNOSIS — G43019 Migraine without aura, intractable, without status migrainosus: Secondary | ICD-10-CM | POA: Diagnosis not present

## 2022-07-01 DIAGNOSIS — S80812A Abrasion, left lower leg, initial encounter: Secondary | ICD-10-CM | POA: Diagnosis not present

## 2022-07-01 DIAGNOSIS — R58 Hemorrhage, not elsewhere classified: Secondary | ICD-10-CM | POA: Diagnosis not present

## 2022-07-01 DIAGNOSIS — Z23 Encounter for immunization: Secondary | ICD-10-CM | POA: Diagnosis not present

## 2022-07-19 DIAGNOSIS — U071 COVID-19: Secondary | ICD-10-CM | POA: Diagnosis not present

## 2022-07-23 DIAGNOSIS — J069 Acute upper respiratory infection, unspecified: Secondary | ICD-10-CM | POA: Diagnosis not present

## 2022-07-23 DIAGNOSIS — U071 COVID-19: Secondary | ICD-10-CM | POA: Diagnosis not present

## 2022-08-13 DIAGNOSIS — J069 Acute upper respiratory infection, unspecified: Secondary | ICD-10-CM | POA: Diagnosis not present

## 2022-08-20 DIAGNOSIS — G518 Other disorders of facial nerve: Secondary | ICD-10-CM | POA: Diagnosis not present

## 2022-08-20 DIAGNOSIS — M542 Cervicalgia: Secondary | ICD-10-CM | POA: Diagnosis not present

## 2022-08-20 DIAGNOSIS — M791 Myalgia, unspecified site: Secondary | ICD-10-CM | POA: Diagnosis not present

## 2022-08-20 DIAGNOSIS — G43019 Migraine without aura, intractable, without status migrainosus: Secondary | ICD-10-CM | POA: Diagnosis not present

## 2022-08-23 DIAGNOSIS — Z23 Encounter for immunization: Secondary | ICD-10-CM | POA: Diagnosis not present

## 2022-08-23 DIAGNOSIS — I10 Essential (primary) hypertension: Secondary | ICD-10-CM | POA: Diagnosis not present

## 2022-09-01 ENCOUNTER — Other Ambulatory Visit: Payer: Medicare Other

## 2022-09-01 ENCOUNTER — Ambulatory Visit: Payer: Medicare Other | Admitting: Hematology and Oncology

## 2022-09-08 ENCOUNTER — Other Ambulatory Visit: Payer: Self-pay

## 2022-09-09 ENCOUNTER — Inpatient Hospital Stay: Payer: Medicare Other | Attending: Hematology and Oncology

## 2022-09-09 ENCOUNTER — Inpatient Hospital Stay (INDEPENDENT_AMBULATORY_CARE_PROVIDER_SITE_OTHER): Payer: Medicare Other | Admitting: Hematology and Oncology

## 2022-09-09 ENCOUNTER — Encounter: Payer: Self-pay | Admitting: Hematology and Oncology

## 2022-09-09 VITALS — BP 111/56 | HR 57 | Temp 97.8°F | Resp 20 | Ht 60.0 in | Wt 120.0 lb

## 2022-09-09 DIAGNOSIS — D72819 Decreased white blood cell count, unspecified: Secondary | ICD-10-CM | POA: Diagnosis not present

## 2022-09-09 DIAGNOSIS — D696 Thrombocytopenia, unspecified: Secondary | ICD-10-CM

## 2022-09-09 DIAGNOSIS — D649 Anemia, unspecified: Secondary | ICD-10-CM | POA: Diagnosis not present

## 2022-09-09 LAB — CBC AND DIFFERENTIAL
HCT: 39 (ref 36–46)
Hemoglobin: 13 (ref 12.0–16.0)
MCV: 94 (ref 81–99)
Neutrophils Absolute: 2.67
Platelets: 127 10*3/uL — AB (ref 150–400)
WBC: 4.3

## 2022-09-09 LAB — BASIC METABOLIC PANEL
BUN: 24 — AB (ref 4–21)
CO2: 26 — AB (ref 13–22)
Chloride: 106 (ref 99–108)
Creatinine: 0.9 (ref 0.5–1.1)
Glucose: 91
Potassium: 4.1 mEq/L (ref 3.5–5.1)
Sodium: 139 (ref 137–147)

## 2022-09-09 LAB — HEPATIC FUNCTION PANEL
ALT: 36 U/L — AB (ref 7–35)
AST: 37 — AB (ref 13–35)
Alkaline Phosphatase: 59 (ref 25–125)
Bilirubin, Total: 0.7

## 2022-09-09 LAB — COMPREHENSIVE METABOLIC PANEL
Albumin: 4.6 (ref 3.5–5.0)
Calcium: 9.3 (ref 8.7–10.7)

## 2022-09-09 LAB — CBC: RBC: 4.13 (ref 3.87–5.11)

## 2022-09-09 NOTE — Progress Notes (Signed)
[facility]  [address] [place],  [place]  [address] [phone]  Clinic Day:  09/09/2022  Referring physician: Townsend Roger, MD  ASSESSMENT & PLAN:   Assessment & Plan: Thrombocytopenia ([place]) Mild thrombocytopenia, which had resolved in April.  She has mild thrombocytopenia again at this time.  We will continue to monitor this.  I will plan to see her back in 3 months for repeat clinical assessment.  Leukopenia Intermittent mild leukopenia, which is resolved this time.   We will plan to see her back in 6 months with a CBC and comprehensive metabolic panel for repeat clinical assessment.  The patient understands the plans discussed today and is in agreement with them.  She knows to contact our office if she develops concerns prior to her next appointment.   I provided 10 minutes of face-to-face time during this encounter and > 50% was spent counseling as documented under my assessment and plan.    Marvia Pickles, PA-C  [facility] [address] [place] [address] Dept: [phone] Dept Fax: [phone]   Orders Placed This Encounter  Procedures   CBC and differential    This external order was created through the Results Console.   CBC    This external order was created through the Results Console.   Basic metabolic panel    This external order was created through the Results Console.   Comprehensive metabolic panel    This external order was created through the Results Console.   Hepatic function panel    This external order was created through the Results Console.      CHIEF COMPLAINT:  CC: Thrombocytopenia and leukopenia  Current Treatment: Surveillance  HISTORY OF PRESENT ILLNESS:  Rebecca [name] is a 76 y.o. female who we began seeing in February for mild thrombocytopenia and leukopenia. She was initially found to have thrombocytopenia in July 2022  with a platelet count of 137,000.  In October, her platelet count was 141,000, white blood cell count was 3.3 with an ANC of 1600, and hemoglobin was normal at 12.6. She underwent GI evaluation with Dr. Lyndel Safe in December, and gastric polyps were seen and biopsies were benign. She also has atrophic gastritis and has been placed on Carafate. Labs at that time showed a white count of 3.6 with an ANC of 2200, platelets were 137,000, and hemoglobin was normal at 13.0.  She states that her white count has been low in the past. She does have a significant family history of malignancy, and states that she did undergo genetic testing a few years ago which was negative. She has had 95% of her breast tissue removed and has had bilateral reconstructions. She underwent partial hysterectomy in 1984 due to menorrhagia, and was on hormone replacement for a short time.   INTERVAL HISTORY:  Rebecca [name] is here today for repeat clinical assessment and states she has been doing well.  She states she bruises easily, but denies excessive bruising or abnormal bleeding.  She reports constipation for which she uses stool softener or Colace as needed.  She was found to have osteoporosis with a T score of -6.1 in the spine and a T score of -3.6 in the femur and bone density scan in April.  She was given IV Reclast in June. She denies fevers or chills. She denies pain. Her appetite is good. Her weight has increased 6 pounds over last 6 months .  REVIEW OF SYSTEMS:  Review of Systems  Constitutional:  Negative for appetite change, chills, fatigue, fever and unexpected weight change.  HENT:   Negative for lump/mass, mouth sores and sore throat.   Respiratory:  Negative for cough and shortness of breath.   Cardiovascular:  Negative for chest pain and leg swelling.  Gastrointestinal:  Positive for constipation. Negative for abdominal pain, diarrhea, nausea and vomiting.  Endocrine: Negative for hot flashes.  Genitourinary:  Negative for  difficulty urinating, dysuria, frequency and hematuria.   Musculoskeletal:  Negative for arthralgias, back pain and myalgias.  Skin:  Negative for rash.  Neurological:  Negative for dizziness and headaches.  Hematological:  Negative for adenopathy. Bruises/bleeds easily (Chronic easy bruising).  Psychiatric/Behavioral:  Negative for depression and sleep disturbance. The patient is not nervous/anxious.     VITALS:  Blood pressure (!) 111/56, pulse (!) 57, temperature 97.8 F (36.6 C), resp. rate 20, height 5' (1.524 m), weight 120 lb (54.4 kg), SpO2 98 %.  Wt Readings from Last 3 Encounters:  09/09/22 120 lb (54.4 kg)  03/01/22 114 lb 8 oz (51.9 kg)  01/01/22 111 lb 1.6 oz (50.4 kg)    Body mass index is 23.44 kg/m.  Performance status (ECOG): 1 - Symptomatic but completely ambulatory  PHYSICAL EXAM:  Physical Exam Vitals and nursing note reviewed.  Constitutional:      General: She is not in acute distress.    Appearance: Normal appearance.  HENT:     Head: Normocephalic and atraumatic.     Mouth/Throat:     Mouth: Mucous membranes are moist.     Pharynx: Oropharynx is clear. No oropharyngeal exudate or posterior oropharyngeal erythema.  Eyes:     General: No scleral icterus.    Extraocular Movements: Extraocular movements intact.     Conjunctiva/sclera: Conjunctivae normal.     Pupils: Pupils are equal, round, and reactive to light.  Cardiovascular:     Rate and Rhythm: Normal rate and regular rhythm.     Heart sounds: Normal heart sounds. No murmur heard.    No friction rub. No gallop.  Pulmonary:     Effort: Pulmonary effort is normal.     Breath sounds: Normal breath sounds. No wheezing, rhonchi or rales.  Abdominal:     General: There is no distension.     Palpations: Abdomen is soft. There is no hepatomegaly, splenomegaly or mass.     Tenderness: There is no abdominal tenderness.  Musculoskeletal:        General: Normal range of motion.     Cervical back: Normal  range of motion and neck supple. No tenderness.     Right lower leg: No edema.     Left lower leg: No edema.  Lymphadenopathy:     Cervical: No cervical adenopathy.     Upper Body:     Right upper body: No supraclavicular or axillary adenopathy.     Left upper body: No supraclavicular or axillary adenopathy.     Lower Body: No right inguinal adenopathy. No left inguinal adenopathy.  Skin:    General: Skin is warm and dry.     Coloration: Skin is not jaundiced.     Findings: No rash.  Neurological:     Mental Status: She is alert and oriented to person, place, and time.     Cranial Nerves: No cranial nerve deficit.  Psychiatric:        Mood and Affect: Mood normal.        Behavior:  Behavior normal.        Thought Content: Thought content normal.   LABS:      Latest Ref Rng & Units 09/09/2022   12:00 AM 03/01/2022   12:00 AM 01/01/2022   12:00 AM  CBC  WBC  4.3     3.3     3.4   Hemoglobin 12.0 - 16.0 13.0     13.3     13.1   Hematocrit 36 - 46 39     41     39   Platelets 150 - 400 K/uL 127     134     141      This result is from an external source.      Latest Ref Rng & Units 09/09/2022   12:00 AM 03/01/2022   12:00 AM 01/01/2022   12:00 AM  CMP  BUN 4 - '21 24     20     20   '$ Creatinine 0.5 - 1.1 0.9     0.7     0.8   Sodium 137 - 147 139     142     142   Potassium 3.5 - 5.1 mEq/L 4.1     4.0     4.2   Chloride 99 - 108 106     104     106   CO2 13 - 22 26     32     29   Calcium 8.7 - 10.7 9.3     9.4       9.4     9.1   Alkaline Phos 25 - 125 59     102     78   AST 13 - 35 37     59     33   ALT 7 - 35 U/L 36     77     32      This result is from an external source.   Multiple values from one day are sorted in reverse-chronological order     No results found for: "CEA1", "CEA" / No results found for: "CEA1", "CEA" No results found for: "PSA1" No results found for: "CAN199" No results found for: "CAN125"  Lab Results  Component Value Date   TOTALPROTELP  6.4 12/16/2021   ALBUMINELP 3.9 12/16/2021   A1GS 0.2 12/16/2021   A2GS 0.7 12/16/2021   BETS 0.8 12/16/2021   GAMS 0.7 12/16/2021   MSPIKE Not Observed 12/16/2021   SPEI Comment 12/16/2021   Lab Results  Component Value Date   FERRITIN 56.2 11/03/2018   IRONPCTSAT 30.1 11/03/2018   No results found for: "LDH"  STUDIES:  No results found.    HISTORY:   Past Medical History:  Diagnosis Date   Anxiety disorder    Blood transfusion    Blood bank notified Ashley Akin)   Blood transfusion without reported diagnosis 1984 x7   [facility]   Complication of anesthesia 2006   Drop in  Bp intra-op with Lap Chole. [employer].   Depression    GERD (gastroesophageal reflux disease)    Headache(784.0)    migraines   History of colon polyps    IBS (irritable bowel syndrome)    Internal hemorrhoids without complication     Past Surgical History:  Procedure Laterality Date   ABDOMINAL HYSTERECTOMY  1984   APPENDECTOMY  1967   AUGMENTATION MAMMAPLASTY Bilateral    BLADDER SUSPENSION  06/23/2011   Procedure: TRANSVAGINAL TAPE (  TVT) PROCEDURE;  Surgeon: Daria Pastures;  Location: [facility];  Service: Gynecology;  Laterality: N/A;   BREAST SURGERY     implants due to removal of breast tissue   CATARACT EXTRACTION     Inserted prosthetic lens   CHOLECYSTECTOMY     due to biliary dyskinesia   COLONOSCOPY  10/11/2016   Minimal sigmoid diverticulosis. Otherwise normal colonsocopy.    CYSTOCELE REPAIR  06/23/2011   Procedure: ANTERIOR REPAIR (CYSTOCELE);  Surgeon: Daria Pastures;  Location: [facility];  Service: Gynecology;  Laterality: N/A;   CYSTOSCOPY  06/23/2011   Procedure: CYSTOSCOPY;  Surgeon: Daria Pastures;  Location: [facility];  Service: Gynecology;  Laterality: N/A;   DNS s/p nasal surgery     ESOPHAGOGASTRODUODENOSCOPY  03/03/2016   Small hiatal hernia. Incidental gastric polyps. Mild gastritis.    EYE MUSCLE SURGERY  as child   HERNIA REPAIR  12/30/2010   inguinal.  performed by Dr Noberto Retort    KNEE ARTHROPLASTY  2006   MASTECTOMY Bilateral    pt states that 95% of tissue removed   OVARIAN CYST REMOVAL     bilateral   RECTOCELE REPAIR  11/10/2011   Procedure: POSTERIOR REPAIR (RECTOCELE);  Surgeon: Daria Pastures, MD;  Location: [facility];  Service: Gynecology;  Laterality: N/A;   RECTOCELE REPAIR N/A 07/03/2014   Procedure: revision of POSTERIOR REPAIR (RECTOCELE) with bladder instillation ;  Surgeon: Daria Pastures, MD;  Location: [facility];  Service: Gynecology;  Laterality: N/A;   RECTOCELE REPAIR N/A 07/03/2014   Procedure: POSTERIOR REPAIR (RECTOCELE) with suture repair for hemostasis ;  Surgeon: Daria Pastures, MD;  Location: [facility];  Service: Gynecology;  Laterality: N/A;   ROTATOR CUFF REPAIR Left    SHOULDER SURGERY Right 06/2019    Family History  Problem Relation Age of Onset   Breast cancer Mother        diagnosed in her 9s   Pancreatic cancer Sister        metastatic   Cancer Brother        73   Cancer Maternal Grandmother    Cancer Maternal Grandfather    Cancer Nephew    Cancer Niece    Colon cancer Neg Hx    Esophageal cancer Neg Hx    Rectal cancer Neg Hx    Stomach cancer Neg Hx     Social History:  reports that she has quit smoking. Her smoking use included cigarettes. She has never used smokeless tobacco. She reports that she does not drink alcohol and does not use drugs.The patient is accompanied by her husband easy bruising today.  Allergies:  Allergies  Allergen Reactions   Ciprofloxacin Other (See Comments)   Zonisamide Other (See Comments)    Unsure about this medication    Codeine Nausea Only and Other (See Comments)   Nitrofurantoin Nausea Only   Other Nausea Only    Current Medications: Current Outpatient Medications  Medication Sig Dispense Refill   conjugated estrogens (PREMARIN) vaginal cream Insert 0.5 g twice a day by vaginal route for 28 days.     cyclobenzaprine (FLEXERIL) 10 MG tablet  Take 10 mg by mouth 3 (three) times daily as needed for muscle spasms.     Dexlansoprazole (DEXILANT) 30 MG capsule DR Take 1 capsule (30 mg total) by mouth daily. 90 capsule 3   dexlansoprazole (DEXILANT) 60 MG capsule Take 1 capsule every day by oral route for 56 days.     Erenumab-aooe (AIMOVIG  [place]) Inject into the skin every 30 (thirty) days.     metoprolol tartrate (LOPRESSOR) 25 MG tablet Take 3 tablets by mouth 2 (two) times daily.     pravastatin (PRAVACHOL) 40 MG tablet Take 40 mg by mouth at bedtime.     traZODone (DESYREL) 50 MG tablet Take by mouth.     Zoledronic Acid (RECLAST IV)      Current Facility-Administered Medications  Medication Dose Route Frequency Provider Last Rate Last Admin   0.9 %  sodium chloride infusion  500 mL Intravenous Once Jackquline Denmark, MD

## 2022-09-09 NOTE — Assessment & Plan Note (Addendum)
Mild thrombocytopenia, which had resolved in April.  She has mild thrombocytopenia again at this time.  We will continue to monitor this.  I will plan to see her back in 3 months for repeat clinical assessment.

## 2022-09-09 NOTE — Assessment & Plan Note (Signed)
Intermittent mild leukopenia, which is resolved this time.

## 2022-10-01 DIAGNOSIS — Z79899 Other long term (current) drug therapy: Secondary | ICD-10-CM | POA: Diagnosis not present

## 2022-10-01 DIAGNOSIS — S299XXA Unspecified injury of thorax, initial encounter: Secondary | ICD-10-CM | POA: Diagnosis not present

## 2022-10-01 DIAGNOSIS — R0781 Pleurodynia: Secondary | ICD-10-CM | POA: Diagnosis not present

## 2022-10-01 DIAGNOSIS — M40204 Unspecified kyphosis, thoracic region: Secondary | ICD-10-CM | POA: Diagnosis not present

## 2022-10-01 DIAGNOSIS — S22070A Wedge compression fracture of T9-T10 vertebra, initial encounter for closed fracture: Secondary | ICD-10-CM | POA: Diagnosis not present

## 2022-10-01 DIAGNOSIS — W19XXXA Unspecified fall, initial encounter: Secondary | ICD-10-CM | POA: Diagnosis not present

## 2022-10-01 DIAGNOSIS — M47814 Spondylosis without myelopathy or radiculopathy, thoracic region: Secondary | ICD-10-CM | POA: Diagnosis not present

## 2022-10-01 DIAGNOSIS — M25561 Pain in right knee: Secondary | ICD-10-CM | POA: Diagnosis not present

## 2022-10-01 DIAGNOSIS — K219 Gastro-esophageal reflux disease without esophagitis: Secondary | ICD-10-CM | POA: Diagnosis not present

## 2022-10-07 DIAGNOSIS — S22080A Wedge compression fracture of T11-T12 vertebra, initial encounter for closed fracture: Secondary | ICD-10-CM | POA: Diagnosis not present

## 2022-10-07 DIAGNOSIS — S32010A Wedge compression fracture of first lumbar vertebra, initial encounter for closed fracture: Secondary | ICD-10-CM | POA: Diagnosis not present

## 2022-11-05 ENCOUNTER — Other Ambulatory Visit: Payer: Self-pay

## 2022-11-05 ENCOUNTER — Telehealth: Payer: Self-pay

## 2022-11-05 MED ORDER — DEXLANSOPRAZOLE 60 MG PO CPDR: 60.0000 mg | DELAYED_RELEASE_CAPSULE | Freq: Every day | ORAL | 11 refills | Status: DC

## 2022-11-05 NOTE — Telephone Encounter (Signed)
Per message from answering service pt needs refill of dexilant.  I have refilled and notified the pt

## 2022-11-24 ENCOUNTER — Encounter: Payer: Self-pay | Admitting: Gastroenterology

## 2022-12-17 ENCOUNTER — Ambulatory Visit: Payer: Medicare Other | Admitting: Internal Medicine

## 2022-12-17 ENCOUNTER — Encounter: Payer: Self-pay | Admitting: Internal Medicine

## 2022-12-17 VITALS — BP 114/64 | HR 65 | Temp 97.3°F | Resp 16 | Ht 60.0 in | Wt 118.8 lb

## 2022-12-17 DIAGNOSIS — S22000D Wedge compression fracture of unspecified thoracic vertebra, subsequent encounter for fracture with routine healing: Secondary | ICD-10-CM

## 2022-12-17 DIAGNOSIS — M8000XD Age-related osteoporosis with current pathological fracture, unspecified site, subsequent encounter for fracture with routine healing: Secondary | ICD-10-CM | POA: Diagnosis not present

## 2022-12-17 DIAGNOSIS — I10 Essential (primary) hypertension: Secondary | ICD-10-CM

## 2022-12-17 DIAGNOSIS — M8000XA Age-related osteoporosis with current pathological fracture, unspecified site, initial encounter for fracture: Secondary | ICD-10-CM | POA: Insufficient documentation

## 2022-12-17 MED ORDER — METOPROLOL TARTRATE 25 MG PO TABS: 75.0000 mg | ORAL_TABLET | Freq: Two times a day (BID) | ORAL | 3 refills | Status: DC

## 2022-12-17 NOTE — Assessment & Plan Note (Signed)
She has multiple vertebral compression fractures with severe osteoporosis.  I am going to see her back in 3 months and at that time she will be due for her repeat reclast infusion.  She is to continue on calcium and Vit D and continue to exercise.

## 2022-12-17 NOTE — Assessment & Plan Note (Signed)
She has a new T9 compression fracture from her fall in 09/2022.  She did see ortho and they recommend conservative management.  She goes back to see them next week.

## 2022-12-17 NOTE — Progress Notes (Signed)
Office Visit  Subjective   Patient ID: Rebecca [name]   DOB: 1946/10/21   Age: 77 y.o.   MRN: [id number]   Chief Complaint Chief Complaint  Patient presents with   Follow-up     History of Present Illness Rebecca [name] has a known history of osteoporosis as well as a history of scoliosis and thoracic vertebral compression fractures.  Over the interim, she states she fell slipped while it was raining in 09/2022.  She went to the ER and they did a CT of her chest which showed a new mild compression fracture at T9 but no acute rib fractures.  She tells me they told her she had rib fractures and I asked her why she didn't come see me after this and she does not know why.  She was sent home in a TLSO brace and followed up with ortho where he recommended conservative treatment due to her history of prior compression fracture and severe osteoporosis.  She was given oxycodone at that time.  She tells me she goes back next week to see ortho.  She was followed by the [facility] in [place]. She had an accidental fall in 10/2020 where she slipped on ice and sustained T4 and T5 vertebral fractures.  She did see the pain and sc[facility] center where it sounds like they did vertebroplasty on one of her vertebra.  She was placed on flexeril and ultram at that time.  She states she has had back pain since she was a child and has a history of scoliosis.  Her back pain can be intermittent and severe at times but this depends on what she does.  Staying on her feet for long periods of time makes it worse.  She presented to me with lower back with radiation into her midback in 01/2021.  There was no loss of bowel/bladder function and she denies any new weakness/numbness.  We did a chest xray at that time looking for a rib fracture.  This was done in 02/2021 and showed prior upper thoracic vertebroplasty with multiple thoracic spine compression fractures some which are new compared to her MRI done on 01/09/2021.   She did go see [facility] for these new compression fractures (no note available).  They did not repeat a MRI but she states they gave her ESI's which did help with her pain.  She states they are going to do a radiofrequency ablation to nerve but this was never done.  I sent her for a bone density which was done in 03/03/2022 and this showed a t-score of -6.1.  We therefore set her up for reclast injections where she received an infusion on 04/08/2022.  She had no side effects from her iv reclast.   The patient is a 77 year old Caucasian/White female who presents for a follow-up evaluation of hypertension.  Her blood pressure was borderline this past year.  The patient has not been checking her blood pressure at home. The patient's current medications include: metoprolol '75mg'$  BID. The patient has been tolerating her medications well. The patient denies any headache, visual changes, dizziness, lightheadness, chest pain, shortness of breath, weakness/numbness, and edema.      Past Medical History Past Medical History:  Diagnosis Date   Anxiety disorder    Complication of anesthesia 2006   Drop in  Bp intra-op with Lap Chole. [employer].   Depression    GERD (gastroesophageal reflux disease)    History of  colon polyps    IBS (irritable bowel syndrome)    Internal hemorrhoids without complication    Migraine      Allergies Allergies  Allergen Reactions   Ciprofloxacin Other (See Comments)   Zonisamide Other (See Comments)    Unsure about this medication    Codeine Nausea Only and Other (See Comments)   Nitrofurantoin Nausea Only   Other Nausea Only     Medications  Current Outpatient Medications:    dexlansoprazole (DEXILANT) 60 MG capsule, Take 1 capsule (60 mg total) by mouth daily., Disp: 30 capsule, Rfl: 11   metoprolol tartrate (LOPRESSOR) 25 MG tablet, Take 3 tablets by mouth 2 (two) times daily., Disp: , Rfl:    pravastatin (PRAVACHOL) 40 MG tablet, Take 40 mg by  mouth at bedtime., Disp: , Rfl:    traZODone (DESYREL) 50 MG tablet, Take by mouth., Disp: , Rfl:    conjugated estrogens (PREMARIN) vaginal cream, Insert 0.5 g twice a day by vaginal route for 28 days., Disp: , Rfl:    cyclobenzaprine (FLEXERIL) 10 MG tablet, Take 10 mg by mouth 3 (three) times daily as needed for muscle spasms., Disp: , Rfl:    Erenumab-aooe (AIMOVIG [place]), Inject into the skin every 30 (thirty) days., Disp: , Rfl:    Zoledronic Acid (RECLAST IV), , Disp: , Rfl:   Current Facility-Administered Medications:    0.9 %  sodium chloride infusion, 500 mL, Intravenous, Once, Jackquline Denmark, MD   Review of Systems Review of Systems  Constitutional:  Negative for chills and fever.  Eyes:  Negative for blurred vision and double vision.  Respiratory:  Negative for cough and shortness of breath.   Cardiovascular:  Negative for chest pain, palpitations and leg swelling.  Gastrointestinal:  Negative for abdominal pain, constipation, diarrhea, nausea and vomiting.  Neurological:  Positive for headaches. Negative for dizziness and weakness.       Objective:    Vitals BP 114/64 (BP Location: Left Arm, Patient Position: Sitting, Cuff Size: Normal)   Pulse 65   Temp (!) 97.3 F (36.3 C) (Temporal)   Resp 16   Ht 5' (1.524 m)   Wt 118 lb 12.8 oz (53.9 kg)   SpO2 97%   BMI 23.20 kg/m    Physical Examination Physical Exam Constitutional:      Appearance: Normal appearance. She is not ill-appearing.  Cardiovascular:     Rate and Rhythm: Normal rate and regular rhythm.     Pulses: Normal pulses.     Heart sounds: No murmur heard.    No friction rub. No gallop.  Pulmonary:     Effort: Pulmonary effort is normal. No respiratory distress.     Breath sounds: No wheezing, rhonchi or rales.  Abdominal:     General: Bowel sounds are normal. There is no distension.     Palpations: Abdomen is soft.     Tenderness: There is no abdominal tenderness.  Musculoskeletal:     Right lower  leg: No edema.     Left lower leg: No edema.  Skin:    General: Skin is warm and dry.     Findings: No rash.  Neurological:     Mental Status: She is alert.        Assessment & Plan:   Essential hypertension Her BP is currently doing well.  We will continue on her metoprolol.  Compression fracture of thoracic vertebra with routine healing She has a new T9 compression fracture from her fall in 09/2022.  She did see ortho and they recommend conservative management.  She goes back to see them next week.  Age-related osteoporosis with current pathological fracture She has multiple vertebral compression fractures with severe osteoporosis.  I am going to see her back in 3 months and at that time she will be due for her repeat reclast infusion.  She is to continue on calcium and Vit D and continue to exercise.    Return in about 3 months (around 03/17/2023).   Townsend Roger, MD

## 2022-12-17 NOTE — Assessment & Plan Note (Signed)
Her BP is currently doing well.  We will continue on her metoprolol.

## 2022-12-23 ENCOUNTER — Ambulatory Visit (INDEPENDENT_AMBULATORY_CARE_PROVIDER_SITE_OTHER): Payer: Medicare Other | Admitting: Gastroenterology

## 2022-12-23 ENCOUNTER — Other Ambulatory Visit (INDEPENDENT_AMBULATORY_CARE_PROVIDER_SITE_OTHER): Payer: Medicare Other

## 2022-12-23 ENCOUNTER — Encounter: Payer: Self-pay | Admitting: Gastroenterology

## 2022-12-23 VITALS — BP 118/68 | HR 54 | Ht 60.0 in | Wt 117.5 lb

## 2022-12-23 DIAGNOSIS — K449 Diaphragmatic hernia without obstruction or gangrene: Secondary | ICD-10-CM | POA: Diagnosis not present

## 2022-12-23 DIAGNOSIS — K219 Gastro-esophageal reflux disease without esophagitis: Secondary | ICD-10-CM

## 2022-12-23 DIAGNOSIS — R1084 Generalized abdominal pain: Secondary | ICD-10-CM

## 2022-12-23 DIAGNOSIS — Z800 Family history of malignant neoplasm of digestive organs: Secondary | ICD-10-CM

## 2022-12-23 DIAGNOSIS — K581 Irritable bowel syndrome with constipation: Secondary | ICD-10-CM | POA: Diagnosis not present

## 2022-12-23 LAB — CBC WITH DIFFERENTIAL/PLATELET
Basophils Absolute: 0 10*3/uL (ref 0.0–0.1)
Basophils Relative: 0.6 % (ref 0.0–3.0)
Eosinophils Absolute: 0.1 10*3/uL (ref 0.0–0.7)
Eosinophils Relative: 2.3 % (ref 0.0–5.0)
HCT: 39.7 % (ref 36.0–46.0)
Hemoglobin: 13.1 g/dL (ref 12.0–15.0)
Lymphocytes Relative: 32.3 % (ref 12.0–46.0)
Lymphs Abs: 1.3 10*3/uL (ref 0.7–4.0)
MCHC: 33 g/dL (ref 30.0–36.0)
MCV: 93.5 fl (ref 78.0–100.0)
Monocytes Absolute: 0.3 10*3/uL (ref 0.1–1.0)
Monocytes Relative: 7.4 % (ref 3.0–12.0)
Neutro Abs: 2.4 10*3/uL (ref 1.4–7.7)
Neutrophils Relative %: 57.4 % (ref 43.0–77.0)
Platelets: 150 10*3/uL (ref 150.0–400.0)
RBC: 4.24 Mil/uL (ref 3.87–5.11)
RDW: 12.7 % (ref 11.5–15.5)
WBC: 4.1 10*3/uL (ref 4.0–10.5)

## 2022-12-23 LAB — COMPREHENSIVE METABOLIC PANEL
ALT: 30 U/L (ref 0–35)
AST: 22 U/L (ref 0–37)
Albumin: 4.4 g/dL (ref 3.5–5.2)
Alkaline Phosphatase: 59 U/L (ref 39–117)
BUN: 16 mg/dL (ref 6–23)
CO2: 30 mEq/L (ref 19–32)
Calcium: 10.6 mg/dL — ABNORMAL HIGH (ref 8.4–10.5)
Chloride: 105 mEq/L (ref 96–112)
Creatinine, Ser: 0.92 mg/dL (ref 0.40–1.20)
GFR: 61.39 mL/min (ref >60.00)
Glucose, Bld: 95 mg/dL (ref 70–99)
Potassium: 3.9 mEq/L (ref 3.5–5.1)
Sodium: 142 mEq/L (ref 135–145)
Total Bilirubin: 0.5 mg/dL (ref 0.2–1.2)
Total Protein: 6.8 g/dL (ref 6.0–8.3)

## 2022-12-23 MED ORDER — DEXLANSOPRAZOLE 60 MG PO CPDR: 60.0000 mg | DELAYED_RELEASE_CAPSULE | Freq: Every day | ORAL | 4 refills | Status: DC

## 2022-12-23 NOTE — Progress Notes (Signed)
Chief Complaint: FU  Referring Provider:  Nona Dell, Corene Cornea, MD      ASSESSMENT AND PLAN;   #1. Gen abdo pain (after fall)  #2. H/O tubular adenomas.  Next colon due 07/2026 only if medically stable.  #3. IBS-C (H/O diarrhea in the past). Neg colon 07/2019 except for mild sigmoid diverticulosis, small tubular adenomas.   #4. GERD with small HH and occ dysphagia d/t presbyesophagus s/p dil Jan 2023  #5. FH metastatic pancreatic cancer (sister).  Plan: -CBC, CMP, lipase -CT AP with contrast -Continue colace 3/day.  -Increase water intake. -Dexilant 19m po qd #90, 4 refills -FU in 12-18 weeks.   HPI:    Rebecca Sadriis a 77y.o. female  Goes to same church as JAnderson MaltaFor follow-up visit  Abdo pain after fall Nov 2023 (as she was getting out of the truck)- frcture ribs/vertebrae Since then having generalized abdominal pain, abdominal bloating, tenderness.  She denies having any nausea or vomiting.  Has chronic intermittent dysphagia, better after previous dilatation.  No heartburn as long as she takes Dexilant.  Constipation is under good control with Colace 3/day.  She does drink plenty of water and uses MiraLAX on as-needed basis.  No melena or hematochezia.   Additional GI history: -Sister had metastatic pancreatic cancer -History of abnormal liver function tests due to fatty liver diagnosed 2002, had liver biopsy 12/2004.  Negative autoimmune hepatitis profile, acute hepatitis profile, ANA  Previous GI workup: EGD 12/01/2021 -Mild presbyesophagus s/p dil 50 FR -2 cm HH -Atrophic gastritis -Few gastric polyps-previously determined to be fundic gland polyps. -Neg gastric Bx  EGD 07/24/2019 -Small HH, mild gastritis -Multiple gastric polyps s/p polypectomy x 3/ Bx-fundic gland polyps -Negative gastric and small bowel biopsies.   Colon: 07/24/2019 -Colonic polyps s/p polypectomy. -Mild sigmoid diverticulosis -Otherwise normal colonoscopy to TI.  Highly  redundant colon. -Bx- TA  Neg CT scan of the abdomen/pelvis February 2019 Past Medical History:  Diagnosis Date   Anxiety disorder    Complication of anesthesia 2006   Drop in  Bp intra-op with Lap Chole. [employer]   Depression    GERD (gastroesophageal reflux disease)    History of colon polyps    IBS (irritable bowel syndrome)    Internal hemorrhoids without complication    Migraine     Past Surgical History:  Procedure Laterality Date   ABDOMINAL HYSTERECTOMY  1984   APPENDECTOMY  1967   AUGMENTATION MAMMAPLASTY Bilateral    BLADDER SUSPENSION  06/23/2011   Procedure: TRANSVAGINAL TAPE (TVT) PROCEDURE;  Surgeon: MDaria Pastures  [facility];  Service: Gynecology;  Laterality: N/A;   BREAST SURGERY     implants due to removal of breast tissue   CATARACT EXTRACTION     Inserted prosthetic lens   CHOLECYSTECTOMY     due to biliary dyskinesia   COLONOSCOPY  10/11/2016   Minimal sigmoid diverticulosis. Otherwise normal colonsocopy.    CYSTOCELE REPAIR  06/23/2011   Procedure: ANTERIOR REPAIR (CYSTOCELE);  Surgeon: MDaria Pastures  [facility];  Service: Gynecology;  Laterality: N/A;   CYSTOSCOPY  06/23/2011   Procedure: CYSTOSCOPY;  Surgeon: MDaria Pastures  [facility];  Service: Gynecology;  Laterality: N/A;   DNS s/p nasal surgery     ESOPHAGOGASTRODUODENOSCOPY  03/03/2016   Small hiatal hernia. Incidental gastric polyps. Mild gastritis.    EYE MUSCLE SURGERY  as child   HERNIA REPAIR  12/30/2010   inguinal. performed by Dr  Lininger    KNEE ARTHROPLASTY  2006   MASTECTOMY Bilateral    pt states that 95% of tissue removed   OVARIAN CYST REMOVAL     bilateral   RECTOCELE REPAIR  11/10/2011   Procedure: POSTERIOR REPAIR (RECTOCELE);  Surgeon: Daria Pastures, MD;  Location: [facility];  Service: Gynecology;  Laterality: N/A;   RECTOCELE REPAIR N/A 07/03/2014   Procedure: revision of POSTERIOR REPAIR (RECTOCELE) with bladder instillation ;   Surgeon: Daria Pastures, MD;  Location: [facility];  Service: Gynecology;  Laterality: N/A;   RECTOCELE REPAIR N/A 07/03/2014   Procedure: POSTERIOR REPAIR (RECTOCELE) with suture repair for hemostasis ;  Surgeon: Daria Pastures, MD;  Location: [facility];  Service: Gynecology;  Laterality: N/A;   ROTATOR CUFF REPAIR Left    SHOULDER SURGERY Right 06/2019    Family History  Problem Relation Age of Onset   Breast cancer Mother        diagnosed in her 79s   Pancreatic cancer Sister        metastatic   Cancer Brother        74   Cancer Maternal Grandmother    Cancer Maternal Grandfather    Cancer Nephew    Cancer Niece    Colon cancer Neg Hx    Esophageal cancer Neg Hx    Rectal cancer Neg Hx    Stomach cancer Neg Hx     Social History   Tobacco Use   Smoking status: Former    Years: 11.00    Types: Cigarettes   Smokeless tobacco: Never   Tobacco comments:    quit 1984  Vaping Use   Vaping Use: Never used  Substance Use Topics   Alcohol use: No   Drug use: No    Current Outpatient Medications  Medication Sig Dispense Refill   cyclobenzaprine (FLEXERIL) 10 MG tablet Take 10 mg by mouth 3 (three) times daily as needed for muscle spasms.     dexlansoprazole (DEXILANT) 60 MG capsule Take 1 capsule (60 mg total) by mouth daily. 30 capsule 11   Erenumab-aooe (AIMOVIG [place]) Inject into the skin every 30 (thirty) days.     metoprolol tartrate (LOPRESSOR) 25 MG tablet Take 3 tablets (75 mg total) by mouth 2 (two) times daily. 540 tablet 3   pravastatin (PRAVACHOL) 40 MG tablet Take 40 mg by mouth at bedtime.     traZODone (DESYREL) 50 MG tablet Take by mouth.     Zoledronic Acid (RECLAST IV)      Current Facility-Administered Medications  Medication Dose Route Frequency Provider Last Rate Last Admin   0.9 %  sodium chloride infusion  500 mL Intravenous Once Jackquline Denmark, MD        Allergies  Allergen Reactions   Ciprofloxacin Other (See Comments)   Zonisamide Other (See  Comments)    Unsure about this medication    Codeine Nausea Only and Other (See Comments)   Nitrofurantoin Nausea Only   Other Nausea Only    Review of Systems:  neg    Physical Exam:    BP 118/68   Pulse (!) 54   Ht 5' (1.524 m)   Wt 117 lb 8 oz (53.3 kg)   SpO2 98%   BMI 22.95 kg/m  Filed Weights   12/23/22 1041  Weight: 117 lb 8 oz (53.3 kg)  Gen: awake, alert, NAD HEENT: anicteric, no pallor CV: RRR, no mrg Pulm: CTA b/l Abd: soft, epigastric and right lower quadrant  abdominal tenderness.  No definite masses.  +BS throughout Ext: no c/c/e Neuro: nonfocal      Carmell Austria, MD 12/23/2022, 10:57 AM  Cc: Townsend Roger, MD

## 2022-12-23 NOTE — Patient Instructions (Signed)
_______________________________________________________  If your blood pressure at your visit was 140/90 or greater, please contact your primary care physician to follow up on this.  _______________________________________________________  If you are age 75 or older, your body mass index should be between 23-30. Your Body mass index is 22.95 kg/m. If this is out of the aforementioned range listed, please consider follow up with your Primary Care Provider.  If you are age 23 or younger, your body mass index should be between 19-25. Your Body mass index is 22.95 kg/m. If this is out of the aformentioned range listed, please consider follow up with your Primary Care Provider.   ________________________________________________________  The [facility] providers would like to encourage you to use [facility] to communicate with providers for non-urgent requests or questions.  Due to long hold times on the telephone, sending your provider a message by [facility] may be a faster and more efficient way to get a response.  Please allow 48 business hours for a response.  Please remember that this is for non-urgent requests.  _______________________________________________________  Your provider has requested that you go to the basement level for lab work before leaving today. Press "B" on the elevator. The lab is located at the first door on the left as you exit the elevator.  We have sent the following medications to your pharmacy for you to pick up at your convenience: Dexilant-mail order pharmacy  Continue colace   Please follow up in 3 months. Give [place] a call at [phone] to schedule an appointment.   You have been scheduled for a CT scan of the abdomen and pelvis at [facility] ([address], [place], [place] [address]).   You are scheduled on 01-11-2023 at 1130am. You should arrive by 930am for your appointment time for registration. Please follow the written instructions below on the  day of your exam:  WARNING: IF YOU ARE ALLERGIC TO IODINE/X-RAY DYE, PLEASE NOTIFY RADIOLOGY IMMEDIATELY AT [phone]! YOU WILL BE GIVEN A 13 HOUR PREMEDICATION PREP.  1) Do not eat or drink anything after 730am  (4 hours prior to your test) 2) You will be given 2 bottles of oral contrast to drink on site. The solution may taste better if refrigerated, but do NOT add ice or any other liquid to this solution. Shake well before drinking.    Drink 1 bottle of contrast @ 930am (2 hours prior to your exam)  Drink 1 bottle of contrast @ 1030am (1 hour prior to your exam)  You may take any medications as prescribed with a small amount of water, if necessary. If you take any of the following medications: METFORMIN, GLUCOPHAGE, GLUCOVANCE, AVANDAMET, RIOMET, FORTAMET, [place] MET, JANUMET, GLUMETZA or METAGLIP, you MAY be asked to HOLD this medication 48 hours AFTER the exam.  The purpose of you drinking the oral contrast is to aid in the visualization of your intestinal tract. The contrast solution may cause some diarrhea. Depending on your individual set of symptoms, you may also receive an intravenous injection of x-ray contrast/dye. Plan on being at [facility] for 30 minutes or longer, depending on the type of exam you are having performed.  This test typically takes 30-45 minutes to complete.  If you have any questions regarding your exam or if you need to reschedule, you may call the CT department at [phone] between the hours of 8:00 am and 5:00 pm, Monday-Friday.  ________________________________________________________________________  Thank you,  Dr. Jackquline Denmark

## 2022-12-27 DIAGNOSIS — S32010A Wedge compression fracture of first lumbar vertebra, initial encounter for closed fracture: Secondary | ICD-10-CM | POA: Diagnosis not present

## 2022-12-27 DIAGNOSIS — S22080A Wedge compression fracture of T11-T12 vertebra, initial encounter for closed fracture: Secondary | ICD-10-CM | POA: Diagnosis not present

## 2022-12-29 DIAGNOSIS — G43719 Chronic migraine without aura, intractable, without status migrainosus: Secondary | ICD-10-CM | POA: Diagnosis not present

## 2023-01-11 ENCOUNTER — Encounter (HOSPITAL_COMMUNITY): Payer: Self-pay

## 2023-01-11 ENCOUNTER — Ambulatory Visit (HOSPITAL_COMMUNITY)
Admission: RE | Admit: 2023-01-11 | Discharge: 2023-01-11 | Disposition: A | Discharge status: Home / Self Care | Payer: Medicare Other | Source: Ambulatory Visit | Attending: Gastroenterology | Admitting: Gastroenterology

## 2023-01-11 DIAGNOSIS — R1084 Generalized abdominal pain: Secondary | ICD-10-CM

## 2023-01-11 DIAGNOSIS — K449 Diaphragmatic hernia without obstruction or gangrene: Secondary | ICD-10-CM | POA: Diagnosis not present

## 2023-01-11 DIAGNOSIS — K581 Irritable bowel syndrome with constipation: Secondary | ICD-10-CM | POA: Diagnosis not present

## 2023-01-11 DIAGNOSIS — K219 Gastro-esophageal reflux disease without esophagitis: Secondary | ICD-10-CM | POA: Insufficient documentation

## 2023-01-11 DIAGNOSIS — Z800 Family history of malignant neoplasm of digestive organs: Secondary | ICD-10-CM | POA: Diagnosis not present

## 2023-01-11 DIAGNOSIS — I700 Atherosclerosis of aorta: Secondary | ICD-10-CM | POA: Diagnosis not present

## 2023-01-11 DIAGNOSIS — R109 Unspecified abdominal pain: Secondary | ICD-10-CM | POA: Diagnosis not present

## 2023-01-11 MED ORDER — IOHEXOL 9 MG/ML PO SOLN
1000.0000 mL | ORAL | Status: AC
  Administered 2023-01-11: 1000 mL via ORAL

## 2023-01-11 MED ORDER — IOHEXOL 9 MG/ML PO SOLN
ORAL | Status: AC
  Filled 2023-01-11: qty 1000

## 2023-01-11 MED ORDER — IOHEXOL 300 MG/ML  SOLN
100.0000 mL | Freq: Once | INTRAMUSCULAR | Status: AC | PRN
  Administered 2023-01-11: 80 mL via INTRAVENOUS

## 2023-01-11 MED ORDER — SODIUM CHLORIDE (PF) 0.9 % IJ SOLN
INTRAMUSCULAR | Status: AC
  Filled 2023-01-11: qty 50

## 2023-01-17 ENCOUNTER — Telehealth: Payer: Self-pay | Admitting: Gastroenterology

## 2023-01-17 NOTE — Telephone Encounter (Signed)
Inbound call from patient, returning [name]'s call in regards to CT results.

## 2023-01-17 NOTE — Telephone Encounter (Signed)
Calling to go over results of CT scan please advise.

## 2023-01-18 NOTE — Telephone Encounter (Signed)
Spoke with pt.  Documented in result notes. Pt verbalized understanding with all questions answered.   

## 2023-01-19 DIAGNOSIS — G43719 Chronic migraine without aura, intractable, without status migrainosus: Secondary | ICD-10-CM | POA: Diagnosis not present

## 2023-01-19 DIAGNOSIS — G43019 Migraine without aura, intractable, without status migrainosus: Secondary | ICD-10-CM | POA: Diagnosis not present

## 2023-03-08 NOTE — Progress Notes (Signed)
[facility]  [address] [place],  [place]  [address] [phone]  Clinic Day:  03/10/23  Referring physician: Crist Fat, MD  ASSESSMENT & PLAN:   Thrombocytopenia, mild, and currently resolved. She has been running between 130-140,000. Evaluation was negative. If her counts start to fall again, we can consider bone marrow evaluation, but her blood count abnormalities are very mild at this time.  Leukopenia with neutropenia, improved. This has been fluctuating up and down. This could represent cyclic neutropenia, but we wouldn't expect thrombocytopenia with that.  There was no evidence of nutritional deficiency or multiple myeloma, so this is likely to be myelodysplasia, which would be diagnosed with a bone marrow.  Chronic back pain secondary to fractures of the vertebrae after a fall in December 2021. For completeness, we will obtain a bone density scan as it has been many years since her last imaging. She is not currently on oral calcium or vitamin D.   Strong family history of malignancy. She has undergone genetic testing with BRCA and Myriad Myrisk panel in July 2021 which was negative.  5.   Poor appetite and weight loss. She is currently on trazodone 75 mg at bedtime and drinking 3 ensures daily. She has gained 3-1/2 pounds since her last visit.  Plan Her CBC remained normal at her last visit on 09/09/2022. Her labs today are pending and I will add a TSH to that and call her back with those results. I will see her back in 6 months with CBC and CMP.  She understands and agrees with this plan of care. I have answered her questions and she knows to call with any concerns.  I provided 20 minutes of face-to-face time during this this encounter and > 50% was spent counseling as documented under my assessment and plan.    Dellia Beckwith, MD [facility] [address]  [place] [address] Dept: [phone] Dept Fax: [phone]    CHIEF COMPLAINT:  CC: Mild thrombocytopenia and mild leukopenia  Current Treatment:  Surveillance   HISTORY OF PRESENT ILLNESS:  Rebecca [name] is a 77 y.o. female referred by Dr. Michel Santee Eyk for the evaluation and treatment of mild thrombocytopenia and leukopenia. She was initially found to have thrombocytopenia back in July 2022 with a platelet count of 137,000. By October her platelet count was 141,000, white count was 3.3 with an ANC of 1600, and hemoglobin was normal at 12.6. Chemistries were unremarkable. She underwent GI evaluation with Dr. Chales Abrahams in December, and gastric polyps were seen and biopsies were benign. She also has atrophic gastritis and has been placed on Carafate. Labs at that time showed a white count of 3.6 with an ANC of 2200, platelets were 137,000, and hemoglobin was normal at 13.0.  She states that her white count has been low in the past. She does have a significant family history of malignancy, and states that she did undergo genetic testing a few years ago which was negative. She has had 95% of her breast tissue removed and has had bilateral reconstructions. She underwent partial hysterectomy in 1984 due to menorrhagia, and was on hormone replacement for a short time.  I have reviewed her chart and materials related to her blood extensively and collaborated history with the patient. Oncology History   No history exists.   INTERVAL HISTORY:  Rebecca [name] is here for routine follow up for mild thrombocytopenia and  mild leukopenia. Patient states that she feels well and has no complaints of pain but wants some back/shoulder support. She does have a brace but doesn't use it often, as it is not fitted properly for someone of her short stature. Her CBC remained normal at her last visit on 09/09/2022. Her labs today are pending and I will add a TSH to that and call her back with those results. I will see her  back in 6 months with CBC and CMP.  She denies signs of infection such as sore throat, sinus drainage, cough, or urinary symptoms.  She denies fevers or recurrent chills. She denies pain. She denies nausea, vomiting, chest pain, dyspnea or cough. Her appetite is alright and her weight has increased 3 pounds over last 1.5 weeks . She is actively trying to gain weight and drinks about 3 boosts a day. This patient is accompanied in the office by her  daughter  .  HISTORY:  Allergies:  Allergies  Allergen Reactions   Ciprofloxacin Other (See Comments)   Zonisamide Other (See Comments)    Unsure about this medication    Codeine Nausea Only and Other (See Comments)   Nitrofurantoin Nausea Only   Other Nausea Only    Current Medications: Current Outpatient Medications  Medication Sig Dispense Refill   Galcanezumab-gnlm (EMGALITY [place]) Inject into the skin. Pt unsure of dosage     dexlansoprazole (DEXILANT) 60 MG capsule Take 1 capsule (60 mg total) by mouth daily. 90 capsule 4   metoprolol tartrate (LOPRESSOR) 25 MG tablet Take 3 tablets (75 mg total) by mouth 2 (two) times daily. 540 tablet 3   pravastatin (PRAVACHOL) 40 MG tablet Take 40 mg by mouth at bedtime.     traZODone (DESYREL) 50 MG tablet Take 1.5 tablets (75 mg total) by mouth at bedtime. 100 tablet 1   Zoledronic Acid (RECLAST IV)      Current Facility-Administered Medications  Medication Dose Route Frequency Provider Last Rate Last Admin   0.9 %  sodium chloride infusion  500 mL Intravenous Once Lynann Bologna, MD        REVIEW OF SYSTEMS:  Review of Systems  Constitutional:  Positive for fatigue. Negative for appetite change (poor), chills, diaphoresis, fever and unexpected weight change.  HENT:  Negative.  Negative for hearing loss, lump/mass, mouth sores, nosebleeds, sore throat, tinnitus, trouble swallowing and voice change.   Eyes: Negative.  Negative for eye problems and icterus.  Respiratory: Negative.  Negative for  chest tightness, cough, hemoptysis, shortness of breath and wheezing.   Cardiovascular: Negative.  Negative for chest pain, leg swelling and palpitations.  Gastrointestinal: Negative.  Negative for abdominal distention, abdominal pain, blood in stool, constipation, diarrhea, nausea, rectal pain and vomiting.  Endocrine: Negative.   Genitourinary: Negative.  Negative for bladder incontinence, difficulty urinating, dyspareunia, dysuria, frequency, hematuria, menstrual problem, nocturia, pelvic pain, vaginal bleeding and vaginal discharge.   Musculoskeletal: Negative.  Negative for arthralgias, back pain, flank pain, gait problem, myalgias, neck pain and neck stiffness.  Skin: Negative.  Negative for itching, rash and wound.  Neurological: Negative.  Negative for dizziness, extremity weakness, gait problem, headaches, light-headedness, numbness, seizures and speech difficulty.  Hematological:  Negative for adenopathy. Bruises/bleeds easily (Bruises only).  Psychiatric/Behavioral: Negative.  Negative for confusion, decreased concentration, depression, sleep disturbance and suicidal ideas. The patient is not nervous/anxious.     VITALS:  Blood pressure (!) 112/52, pulse (!) 53, temperature 97.6 F (36.4 C), temperature source Oral, height 5' (1.524 m),  weight 120 lb 14.4 oz (54.8 kg), SpO2 100 %.  Wt Readings from Last 3 Encounters:  03/17/23 122 lb 3.2 oz (55.4 kg)  03/10/23 120 lb 14.4 oz (54.8 kg)  12/23/22 117 lb 8 oz (53.3 kg)    Body mass index is 23.61 kg/m.  Performance status (ECOG): 1 - Symptomatic but completely ambulatory  PHYSICAL EXAM:  Physical Exam Vitals and nursing note reviewed. Exam conducted with a chaperone present.  Constitutional:      General: She is not in acute distress.    Appearance: Normal appearance. She is normal weight. She is not ill-appearing, toxic-appearing or diaphoretic.  HENT:     Head: Normocephalic and atraumatic.     Right Ear: Tympanic membrane,  ear canal and external ear normal. There is no impacted cerumen.     Left Ear: Tympanic membrane, ear canal and external ear normal. There is no impacted cerumen.     Nose: Nose normal. No congestion or rhinorrhea.     Mouth/Throat:     Mouth: Mucous membranes are moist.     Pharynx: Oropharynx is clear. No oropharyngeal exudate or posterior oropharyngeal erythema.  Eyes:     General: No scleral icterus.       Right eye: No discharge.        Left eye: No discharge.     Extraocular Movements: Extraocular movements intact.     Conjunctiva/sclera: Conjunctivae normal.     Pupils: Pupils are equal, round, and reactive to light.  Neck:     Vascular: No carotid bruit.  Cardiovascular:     Rate and Rhythm: Normal rate and regular rhythm.     Pulses: Normal pulses.     Heart sounds: Normal heart sounds. No murmur heard.    No friction rub. No gallop.  Pulmonary:     Effort: Pulmonary effort is normal. No respiratory distress.     Breath sounds: Normal breath sounds. No stridor. No wheezing, rhonchi or rales.  Chest:     Chest wall: No tenderness.  Abdominal:     General: Bowel sounds are normal. There is no distension.     Palpations: Abdomen is soft. There is no mass.     Tenderness: There is no abdominal tenderness. There is no right CVA tenderness, left CVA tenderness, guarding or rebound.     Hernia: No hernia is present.  Musculoskeletal:        General: No swelling, tenderness, deformity or signs of injury. Normal range of motion.     Cervical back: Normal range of motion. No rigidity or tenderness.     Right lower leg: No edema.     Left lower leg: No edema.     Left ankle: Ecchymosis present.     Comments: Fairly large bruise on the back of her left ankle.  She does have Kyphosis  Lymphadenopathy:     Cervical: No cervical adenopathy.  Skin:    General: Skin is warm and dry.     Coloration: Skin is not jaundiced or pale.     Findings: No bruising, erythema, lesion or rash.   Neurological:     General: No focal deficit present.     Mental Status: She is alert and oriented to person, place, and time. Mental status is at baseline.     Cranial Nerves: No cranial nerve deficit.     Sensory: No sensory deficit.     Motor: No weakness.     Coordination: Coordination normal.  Gait: Gait normal.     Deep Tendon Reflexes: Reflexes normal.  Psychiatric:        Mood and Affect: Mood normal.        Behavior: Behavior normal.        Thought Content: Thought content normal.        Judgment: Judgment normal.     LABS:      Latest Ref Rng & Units 03/10/2023    9:35 AM 12/23/2022   11:39 AM 09/09/2022   12:00 AM  CBC  WBC 4.0 - 10.5 K/uL 3.5  4.1  4.3      Hemoglobin 12.0 - 15.0 g/dL 16.1  09.6  04.5      Hematocrit 36.0 - 46.0 % 40.8  39.7  39      Platelets 150 - 400 K/uL 138  150.0  127         This result is from an external source.      Latest Ref Rng & Units 03/17/2023   10:40 AM 03/10/2023    9:35 AM 12/23/2022   11:39 AM  CMP  Glucose 70 - 99 mg/dL 84  63  95   BUN 8 - 27 mg/dL 19  20  16    Creatinine 0.57 - 1.00 mg/dL 4.09  8.11  9.14   Sodium 134 - 144 mmol/L 142  140  142   Potassium 3.5 - 5.2 mmol/L 4.4  4.0  3.9   Chloride 96 - 106 mmol/L 105  106  105   CO2 20 - 29 mmol/L 25  27  30    Calcium 8.7 - 10.3 mg/dL 9.2  9.5  78.2   Total Protein 6.0 - 8.5 g/dL 6.2  6.8  6.8   Total Bilirubin 0.0 - 1.2 mg/dL 0.3  0.6  0.5   Alkaline Phos 44 - 121 IU/L 62  53  59   AST 0 - 40 IU/L 26  27  22    ALT 0 - 32 IU/L 23  26  30      Lab Results  Component Value Date   TOTALPROTELP 6.4 12/16/2021   ALBUMINELP 3.9 12/16/2021   A1GS 0.2 12/16/2021   A2GS 0.7 12/16/2021   BETS 0.8 12/16/2021   GAMS 0.7 12/16/2021   MSPIKE Not Observed 12/16/2021   SPEI Comment 12/16/2021   Lab Results  Component Value Date   FERRITIN 56.2 11/03/2018   IRONPCTSAT 30.1 11/03/2018   No results found for: "LDH"   STUDIES:  No results found.  EXAM: 01/11/2023 CT  ABDOMEN AND PELVIS WITH CONTRAST IMPRESSION: No acute findings within the abdomen or pelvis. Tiny left renal calculi. No evidence of ureteral calculi or hydronephrosis. 3. Aortic Atherosclerosis (ICD10-I70.0).     I,Jasmine M Lassiter,acting as a scribe for Dellia Beckwith, MD.,have documented all relevant documentation on the behalf of Dellia Beckwith, MD,as directed by  Dellia Beckwith, MD while in the presence of Dellia Beckwith, MD.

## 2023-03-09 ENCOUNTER — Other Ambulatory Visit: Payer: Self-pay | Admitting: Oncology

## 2023-03-09 DIAGNOSIS — D72819 Decreased white blood cell count, unspecified: Secondary | ICD-10-CM

## 2023-03-10 ENCOUNTER — Other Ambulatory Visit: Payer: Self-pay | Admitting: Oncology

## 2023-03-10 ENCOUNTER — Encounter: Payer: Self-pay | Admitting: Oncology

## 2023-03-10 ENCOUNTER — Inpatient Hospital Stay: Payer: Medicare Other | Attending: Oncology | Admitting: Oncology

## 2023-03-10 ENCOUNTER — Inpatient Hospital Stay: Payer: Medicare Other

## 2023-03-10 ENCOUNTER — Other Ambulatory Visit: Payer: Self-pay

## 2023-03-10 VITALS — BP 112/52 | HR 53 | Temp 97.6°F | Ht 60.0 in | Wt 120.9 lb

## 2023-03-10 DIAGNOSIS — R5383 Other fatigue: Secondary | ICD-10-CM

## 2023-03-10 DIAGNOSIS — Z79899 Other long term (current) drug therapy: Secondary | ICD-10-CM | POA: Diagnosis not present

## 2023-03-10 DIAGNOSIS — D72819 Decreased white blood cell count, unspecified: Secondary | ICD-10-CM

## 2023-03-10 DIAGNOSIS — D696 Thrombocytopenia, unspecified: Secondary | ICD-10-CM

## 2023-03-10 LAB — CBC WITH DIFFERENTIAL (CANCER CENTER ONLY)
Abs Immature Granulocytes: 0.01 10*3/uL (ref 0.00–0.07)
Basophils Absolute: 0 10*3/uL (ref 0.0–0.1)
Basophils Relative: 1 %
Eosinophils Absolute: 0.1 10*3/uL (ref 0.0–0.5)
Eosinophils Relative: 3 %
HCT: 40.8 % (ref 36.0–46.0)
Hemoglobin: 12.8 g/dL (ref 12.0–15.0)
Immature Granulocytes: 0 %
Lymphocytes Relative: 28 %
Lymphs Abs: 1 10*3/uL (ref 0.7–4.0)
MCH: 31 pg (ref 26.0–34.0)
MCHC: 31.4 g/dL (ref 30.0–36.0)
MCV: 98.8 fL (ref 80.0–100.0)
Monocytes Absolute: 0.4 10*3/uL (ref 0.1–1.0)
Monocytes Relative: 12 %
Neutro Abs: 1.9 10*3/uL (ref 1.7–7.7)
Neutrophils Relative %: 56 %
Platelet Count: 138 10*3/uL — ABNORMAL LOW (ref 150–400)
RBC: 4.13 MIL/uL (ref 3.87–5.11)
RDW: 12.2 % (ref 11.5–15.5)
WBC Count: 3.5 10*3/uL — ABNORMAL LOW (ref 4.0–10.5)
nRBC: 0 % (ref 0.0–0.2)

## 2023-03-10 LAB — TSH: TSH: 3.228 u[IU]/mL (ref 0.350–4.500)

## 2023-03-10 LAB — CMP (CANCER CENTER ONLY)
ALT: 26 U/L (ref 0–44)
AST: 27 U/L (ref 15–41)
Albumin: 4.1 g/dL (ref 3.5–5.0)
Alkaline Phosphatase: 53 U/L (ref 38–126)
Anion gap: 7 (ref 5–15)
BUN: 20 mg/dL (ref 8–23)
CO2: 27 mmol/L (ref 22–32)
Calcium: 9.5 mg/dL (ref 8.9–10.3)
Chloride: 106 mmol/L (ref 98–111)
Creatinine: 0.8 mg/dL (ref 0.44–1.00)
GFR, Estimated: >60 mL/min (ref >60)
Glucose, Bld: 63 mg/dL — ABNORMAL LOW (ref 70–99)
Potassium: 4 mmol/L (ref 3.5–5.1)
Sodium: 140 mmol/L (ref 135–145)
Total Bilirubin: 0.6 mg/dL (ref 0.3–1.2)
Total Protein: 6.8 g/dL (ref 6.5–8.1)

## 2023-03-11 ENCOUNTER — Other Ambulatory Visit: Payer: Self-pay

## 2023-03-11 MED ORDER — TRAZODONE HCL 50 MG PO TABS: 75.0000 mg | ORAL_TABLET | Freq: Every day | ORAL | 1 refills | Status: DC

## 2023-03-15 DIAGNOSIS — L918 Other hypertrophic disorders of the skin: Secondary | ICD-10-CM | POA: Diagnosis not present

## 2023-03-15 DIAGNOSIS — D485 Neoplasm of uncertain behavior of skin: Secondary | ICD-10-CM | POA: Diagnosis not present

## 2023-03-17 ENCOUNTER — Ambulatory Visit: Payer: Medicare Other | Admitting: Internal Medicine

## 2023-03-17 ENCOUNTER — Encounter: Payer: Self-pay | Admitting: Internal Medicine

## 2023-03-17 VITALS — BP 118/70 | HR 69 | Temp 98.1°F | Resp 16 | Ht 61.0 in | Wt 122.2 lb

## 2023-03-17 DIAGNOSIS — M8000XD Age-related osteoporosis with current pathological fracture, unspecified site, subsequent encounter for fracture with routine healing: Secondary | ICD-10-CM | POA: Diagnosis not present

## 2023-03-17 DIAGNOSIS — S22070D Wedge compression fracture of T9-T10 vertebra, subsequent encounter for fracture with routine healing: Secondary | ICD-10-CM | POA: Diagnosis not present

## 2023-03-17 NOTE — Assessment & Plan Note (Signed)
We will obtain a CMP and set her up for iv reclast infusion next month.

## 2023-03-17 NOTE — Progress Notes (Signed)
Office Visit  Subjective   Patient ID: Rebecca [name]   DOB: 06/26/1946   Age: 77 y.o.   MRN: [id number]   Chief Complaint Chief Complaint  Patient presents with   Follow-up     History of Present Illness Rebecca [name] has a known history of osteoporosis as well as a history of scoliosis and thoracic vertebral compression fractures.  She returns today for her upcoming iv reclast injection.   This past year,  she fell where she slipped while it was raining in 09/2022 and was discovered to have a new mild compression fracture of T9.  She went to the ER and they did a CT of her chest which showed a new mild compression fracture at T9 but no acute rib fractures.  She tells me they told her she had rib fractures and I asked her why she didn't come see me after this and she does not know why.  She was sent home in a TLSO brace and followed up with ortho where he recommended conservative treatment due to her history of prior compression fracture and severe osteoporosis.  She was given oxycodone at that time.  She did go to see ortho in 12/2022 (I do not have these notes) but the patient was told that her compression fracture was healing.  She states she has not had any repeat xrays.  [facility] [place]. She had an accidental fall in 10/2020 where she slipped on ice and sustained T4 and T5 vertebral fractures.  She did see the [facility] center where it sounds like they did vertebroplasty on one of her vertebra.  She was placed on flexeril and ultram at that time.  She states she has had back pain since she was a child and has a history of scoliosis.  Her back pain can be intermittent and severe at times but this depends on what she does.  Staying on her feet for long periods of time makes it worse.  She presented to me with lower back with radiation into her midback in 01/2021.  There was no loss of bowel/bladder function and she denies any new  weakness/numbness.  We did a chest xray at that time looking for a rib fracture.  This was done in 02/2021 and showed prior upper thoracic vertebroplasty with multiple thoracic spine compression fractures some which are new compared to her MRI done on 01/09/2021.  She did [facility] these new compression fractures (no note available).  They did not repeat a MRI but she states they gave her ESI's which did help with her pain.  She states they are going to do a radiofrequency ablation to nerve but this was never done.  I sent her for a bone density which was done in 03/03/2022 and this showed a t-score of -6.1.  We therefore set her up for reclast injections where she received an infusion on 04/08/2022.  She had no side effects from her iv reclast.  She is due next month for her next IV reclast.  Her last Vit D level was drawn in 05/2022 and this was normal at 42.9 ng/mL.       Past Medical History Past Medical History:  Diagnosis Date   Anxiety disorder    Complication of anesthesia 2006   Drop in  Bp intra-op with Lap Chole. [employer].   Depression    GERD (gastroesophageal reflux disease)  History of colon polyps    IBS (irritable bowel syndrome)    Internal hemorrhoids without complication    Migraine      Allergies Allergies  Allergen Reactions   Ciprofloxacin Other (See Comments)   Zonisamide Other (See Comments)    Unsure about this medication    Codeine Nausea Only and Other (See Comments)   Nitrofurantoin Nausea Only   Other Nausea Only     Medications  Current Outpatient Medications:    dexlansoprazole (DEXILANT) 60 MG capsule, Take 1 capsule (60 mg total) by mouth daily., Disp: 90 capsule, Rfl: 4   Galcanezumab-gnlm (EMGALITY [place]), Inject into the skin. Pt unsure of dosage, Disp: , Rfl:    metoprolol tartrate (LOPRESSOR) 25 MG tablet, Take 3 tablets (75 mg total) by mouth 2 (two) times daily., Disp: 540 tablet, Rfl: 3   pravastatin (PRAVACHOL) 40 MG  tablet, Take 40 mg by mouth at bedtime., Disp: , Rfl:    traZODone (DESYREL) 50 MG tablet, Take 1.5 tablets (75 mg total) by mouth at bedtime., Disp: 100 tablet, Rfl: 1   Zoledronic Acid (RECLAST IV), , Disp: , Rfl:   Current Facility-Administered Medications:    0.9 %  sodium chloride infusion, 500 mL, Intravenous, Once, Lynann Bologna, MD   Review of Systems Review of Systems  Constitutional:  Negative for chills and fever.  Respiratory:  Negative for cough and shortness of breath.   Cardiovascular:  Negative for chest pain, palpitations and leg swelling.  Gastrointestinal:  Negative for abdominal pain, constipation, diarrhea, nausea and vomiting.  Musculoskeletal:  Positive for back pain. Negative for myalgias and neck pain.  Neurological:  Negative for dizziness, weakness and headaches.       Objective:    Vitals BP 118/70   Pulse 69   Temp 98.1 F (36.7 C)   Resp 16   Ht 5\' 1"  (1.549 m)   Wt 122 lb 3.2 oz (55.4 kg)   SpO2 93%   BMI 23.09 kg/m    Physical Examination Physical Exam Constitutional:      Appearance: Normal appearance. She is not ill-appearing.  Cardiovascular:     Rate and Rhythm: Normal rate and regular rhythm.     Pulses: Normal pulses.     Heart sounds: No murmur heard.    No friction rub. No gallop.  Pulmonary:     Effort: Pulmonary effort is normal. No respiratory distress.     Breath sounds: No wheezing, rhonchi or rales.  Abdominal:     General: Bowel sounds are normal. There is no distension.     Palpations: Abdomen is soft.     Tenderness: There is no abdominal tenderness.  Musculoskeletal:     Right lower leg: No edema.     Left lower leg: No edema.  Skin:    General: Skin is warm and dry.     Findings: No rash.  Neurological:     Mental Status: She is alert.        Assessment & Plan:   Compression fracture of thoracic vertebra with routine healing She has seen ortho that was told this is healing.  She is no longer on any pain  meds or her back brace.  Age-related osteoporosis with current pathological fracture We will obtain a CMP and set her up for iv reclast infusion next month.    Return in about 3 months (around 06/17/2023) for annual.   Crist Fat, MD

## 2023-03-17 NOTE — Assessment & Plan Note (Signed)
She has seen ortho that was told this is healing.  She is no longer on any pain meds or her back brace.

## 2023-03-18 LAB — CMP14 + ANION GAP
ALT: 23 IU/L (ref 0–32)
AST: 26 IU/L (ref 0–40)
Albumin/Globulin Ratio: 2.4 — ABNORMAL HIGH (ref 1.2–2.2)
Albumin: 4.4 g/dL (ref 3.8–4.8)
Alkaline Phosphatase: 62 IU/L (ref 44–121)
Anion Gap: 12 mmol/L (ref 10.0–18.0)
BUN/Creatinine Ratio: 17 (ref 12–28)
BUN: 19 mg/dL (ref 8–27)
Bilirubin Total: 0.3 mg/dL (ref 0.0–1.2)
CO2: 25 mmol/L (ref 20–29)
Calcium: 9.2 mg/dL (ref 8.7–10.3)
Chloride: 105 mmol/L (ref 96–106)
Creatinine, Ser: 1.11 mg/dL — ABNORMAL HIGH (ref 0.57–1.00)
Globulin, Total: 1.8 g/dL (ref 1.5–4.5)
Glucose: 84 mg/dL (ref 70–99)
Potassium: 4.4 mmol/L (ref 3.5–5.2)
Sodium: 142 mmol/L (ref 134–144)
Total Protein: 6.2 g/dL (ref 6.0–8.5)
eGFR: 52 mL/min/{1.73_m2} — ABNORMAL LOW (ref >59)

## 2023-03-31 ENCOUNTER — Telehealth: Payer: Self-pay

## 2023-03-31 NOTE — Telephone Encounter (Signed)
-----   Message from Crist Fat, MD sent at 03/27/2023  8:28 PM EDT ----- She is a little dehydrated on labs.  Drink more water.

## 2023-03-31 NOTE — Telephone Encounter (Signed)
Pt notified of lab results

## 2023-04-11 DIAGNOSIS — M810 Age-related osteoporosis without current pathological fracture: Secondary | ICD-10-CM | POA: Diagnosis not present

## 2023-04-13 DIAGNOSIS — G43719 Chronic migraine without aura, intractable, without status migrainosus: Secondary | ICD-10-CM | POA: Diagnosis not present

## 2023-05-02 DIAGNOSIS — H26493 Other secondary cataract, bilateral: Secondary | ICD-10-CM | POA: Diagnosis not present

## 2023-05-02 DIAGNOSIS — H04123 Dry eye syndrome of bilateral lacrimal glands: Secondary | ICD-10-CM | POA: Diagnosis not present

## 2023-05-02 DIAGNOSIS — Z961 Presence of intraocular lens: Secondary | ICD-10-CM | POA: Diagnosis not present

## 2023-05-02 DIAGNOSIS — H524 Presbyopia: Secondary | ICD-10-CM | POA: Diagnosis not present

## 2023-05-09 ENCOUNTER — Ambulatory Visit: Payer: Medicare Other | Admitting: Internal Medicine

## 2023-05-09 ENCOUNTER — Encounter: Payer: Self-pay | Admitting: Internal Medicine

## 2023-05-09 VITALS — BP 120/60 | HR 64 | Temp 98.1°F | Resp 16 | Ht 61.0 in | Wt 126.2 lb

## 2023-05-09 DIAGNOSIS — M542 Cervicalgia: Secondary | ICD-10-CM | POA: Diagnosis not present

## 2023-05-09 NOTE — Progress Notes (Signed)
Office Visit  Subjective   Patient ID: Rebecca [name]   DOB: July 24, 1946   Age: 77 y.o.   MRN: [id number]   Chief Complaint Chief Complaint  Patient presents with   Acute Visit    Neck pain     History of Present Illness Rebecca [name] has a known history of osteoporosis as well as a history of scoliosis and thoracic vertebral compression fractures.  She returns today for her upcoming iv reclast injection.  This past year,  she fell where she slipped while it was raining in 09/2022 and was discovered to have a new mild compression fracture of T9.  She went to the ER and they did a CT of her chest which showed a new mild compression fracture at T9 but no acute rib fractures.  She tells me they told her she had rib fractures and I asked her why she didn't come see me after this and she does not know why.  She was sent home in a TLSO brace and followed up with ortho where he recommended conservative treatment due to her history of prior compression fracture and severe osteoporosis.  She was given oxycodone at that time.  She did go to see ortho in 12/2022 but the patient was told that her compression fracture was healing.  She states she has not had any repeat xrays.  She was followed by [facility] in [place]. She had an accidental fall in 10/2020 where she slipped on ice and sustained T4 and T5 vertebral fractures.  She did see the [facility] and [facility] center where it sounds like they did vertebroplasty on one of her vertebra.  She was placed on flexeril and ultram at that time.  She states she has had back pain since she was a child and has a history of scoliosis.  Her back pain can be intermittent and severe at times but this depends on what she does.  Staying on her feet for long periods of time makes it worse.  Today, on 05/09/2023, she states that her back pain is controlled.  She presented to me with lower back with radiation into her midback in 01/2021.  There was no loss of  bowel/bladder function and she denies any new weakness/numbness.  We did a chest xray at that time looking for a rib fracture.  This was done in 02/2021 and showed prior upper thoracic vertebroplasty with multiple thoracic spine compression fractures some which are new compared to her MRI done on 01/09/2021.  She did go see [facility] for these new compression fractures (no note available).  They did not repeat a MRI but she states they gave her ESI's which did help with her pain.  She states they are going to do a radiofrequency ablation to nerve but this was never done.  I sent her for a bone density which was done in 03/03/2022 and this showed a t-score of -6.1.  We therefore set her up for reclast injections where she received an infusion on 04/08/2022.  She had no side effects from her iv reclast.  She is due next month for her next IV reclast.  Her last Vit D level was drawn in 05/2022 and this was normal at 42.9 ng/mL.  Rebecca [name] returns today for her neck pain.  Over the interim, it has not worsened but it has not improved and has been stable.  Again, this pain initially began after sustain a fall in 10/2020  where she slipped on ice and sustained T4 and T5 vertebral fractures.  We did obtain a MRI of her cervical spine last year in 04/2022.  She saw me about 3-4 weeks prior due to chronic headaches.  She does have a history of migraines where she states they initially started in 2005 however they went away and she was not effected by migraines for years.  These migraines were located in a bifrontal type distribution.  She denied any auras but she would have photophobia and phonophobia.  She is following with Dr [facility] where she is on Aimovig 140mg  monthly and prn Cyclobenzaprine 10mg  3 times a day as needed. She has also used Imipramine and Botox in the past.  Her Botox is no longer covered by her insurance anymore.  She saw her headache doctor in April where he did  trigger point injections per the patient.  This helped for 2 days then her headaches returned.  There is no focal deficit associated with these headaches.  I did start her on a trial of gabapentin 200mg  qhs but she states this did not help with her headaches.  We therefore ordered a MRI of her cervical spine which was done on 04/23/2022.  This showed trace grade 1 anterolisthesis of C5 on C6 with multilevel degenerative disc disease.  She had unchanged chronic mild T3 and moderate to high grade T5 vertebral body compression fractures (see below).  She had very mild left C3-C4 and C4-C5 neuroforaminal narrowing without true stenosis.  There was mild posteroinferior T4 vertebral retropulsion mildly contacting the ventral cord that is unchanged from her prior MRI compared to her scans in 2014 and 2022.  I referred her to [facility] whom she saw on 05/24/2022 where he noted she had worsening left sided headaches.  He felt a component of this was occiptial neuralgia and he did not recommend any surgical intervention.   He felt that it may be worthwhile to try an occipital nerve block.  He also left it up to Dr. Neale Burly to decided whether cervical spine injections would be worthwhile and they could refer her to their pain management specialist.   I felt she has cervicogenic headaches with component of occiptogenic. I think she has a neuropathic and arthritic component to her pain at looking at her MRI. I discontinued her gabapentin in 05/2022 and tried her on lyrica 50mg  BID with Celebrex 200mg  daily x 2 weeks to see if this helps.  She states that this did helped initally but then it stopped helping with her neck pain and she stopped the medications.  She was also on tramadol at that time but she tells me that she did not have to take extra tramadol to help control her pain.  Today, there is no loss of bowel/bladder function and no new weakness/numbness of hand or feet.  The patient tells me that Dr. Neale Burly did  not discuss her neck pain this past year.         Past Medical History Past Medical History:  Diagnosis Date   Anxiety disorder    Complication of anesthesia 2006   Drop in  Bp intra-op with Lap Chole. [employer].   Depression    GERD (gastroesophageal reflux disease)    History of colon polyps    IBS (irritable bowel syndrome)    Internal hemorrhoids without complication    Migraine      Allergies Allergies  Allergen Reactions   Ciprofloxacin Other (  See Comments)   Zonisamide Other (See Comments)    Unsure about this medication    Codeine Nausea Only and Other (See Comments)   Nitrofurantoin Nausea Only   Other Nausea Only     Medications  Current Outpatient Medications:    dexlansoprazole (DEXILANT) 60 MG capsule, Take 1 capsule (60 mg total) by mouth daily., Disp: 90 capsule, Rfl: 4   Galcanezumab-gnlm (EMGALITY [place]), Inject into the skin. Pt unsure of dosage, Disp: , Rfl:    metoprolol tartrate (LOPRESSOR) 25 MG tablet, Take 3 tablets (75 mg total) by mouth 2 (two) times daily., Disp: 540 tablet, Rfl: 3   pravastatin (PRAVACHOL) 40 MG tablet, Take 40 mg by mouth at bedtime., Disp: , Rfl:    traZODone (DESYREL) 50 MG tablet, Take 1.5 tablets (75 mg total) by mouth at bedtime., Disp: 100 tablet, Rfl: 1   Zoledronic Acid (RECLAST IV), , Disp: , Rfl:   Current Facility-Administered Medications:    0.9 %  sodium chloride infusion, 500 mL, Intravenous, Once, Lynann Bologna, MD   Review of Systems Review of Systems  Constitutional:  Negative for chills and fever.  Eyes:  Negative for blurred vision and double vision.  Respiratory:  Negative for cough and shortness of breath.   Cardiovascular:  Negative for chest pain, palpitations and leg swelling.  Gastrointestinal:  Negative for abdominal pain, constipation, diarrhea, nausea and vomiting.  Musculoskeletal:  Positive for neck pain. Negative for back pain and myalgias.  Neurological:  Negative for dizziness,  weakness and headaches.       Objective:    Vitals BP 120/60   Pulse 64   Temp 98.1 F (36.7 C)   Resp 16   Ht 5\' 1"  (1.549 m)   Wt 126 lb 3.2 oz (57.2 kg)   SpO2 97%   BMI 23.85 kg/m    Physical Examination Physical Exam Constitutional:      Appearance: Normal appearance. She is not ill-appearing.  Cardiovascular:     Rate and Rhythm: Normal rate and regular rhythm.     Pulses: Normal pulses.     Heart sounds: No murmur heard.    No friction rub. No gallop.  Pulmonary:     Effort: Pulmonary effort is normal. No respiratory distress.     Breath sounds: No wheezing, rhonchi or rales.  Abdominal:     General: Bowel sounds are normal. There is no distension.     Palpations: Abdomen is soft.     Tenderness: There is no abdominal tenderness.  Musculoskeletal:     Right lower leg: No edema.     Left lower leg: No edema.  Skin:    General: Skin is warm and dry.     Findings: No rash.  Neurological:     Mental Status: She is alert.     Comments: Grip strength is normal bilaterally and her lower extremity strength if 5/5.        Assessment & Plan:   Neck pain She states there has been no change to her neck pain since 05/2022 when she was seen by neurosurgery.  I am going to refer her to Dr. Belva Bertin to see if he can do an occipital nerve block or even cervical neck injections.      No follow-ups on file.   Crist Fat, MD

## 2023-05-09 NOTE — Assessment & Plan Note (Signed)
She states there has been no change to her neck pain since 05/2022 when she was seen by neurosurgery.  I am going to refer her to Dr. Belva Bertin to see if he can do an occipital nerve block or even cervical neck injections.

## 2023-05-25 DIAGNOSIS — M47812 Spondylosis without myelopathy or radiculopathy, cervical region: Secondary | ICD-10-CM | POA: Diagnosis not present

## 2023-05-25 DIAGNOSIS — M62838 Other muscle spasm: Secondary | ICD-10-CM | POA: Diagnosis not present

## 2023-05-30 DIAGNOSIS — R1032 Left lower quadrant pain: Secondary | ICD-10-CM | POA: Diagnosis not present

## 2023-05-30 DIAGNOSIS — R350 Frequency of micturition: Secondary | ICD-10-CM | POA: Diagnosis not present

## 2023-06-09 ENCOUNTER — Other Ambulatory Visit: Payer: Self-pay | Admitting: Obstetrics and Gynecology

## 2023-06-09 DIAGNOSIS — Z1231 Encounter for screening mammogram for malignant neoplasm of breast: Secondary | ICD-10-CM

## 2023-07-05 ENCOUNTER — Other Ambulatory Visit: Payer: Self-pay

## 2023-07-05 MED ORDER — TRAZODONE HCL 50 MG PO TABS: 75.0000 mg | ORAL_TABLET | Freq: Every day | ORAL | 1 refills | Status: DC

## 2023-07-06 ENCOUNTER — Ambulatory Visit
Admission: RE | Admit: 2023-07-06 | Discharge: 2023-07-06 | Disposition: A | Discharge status: Home / Self Care | Payer: Medicare Other | Source: Ambulatory Visit | Attending: Obstetrics and Gynecology | Admitting: Obstetrics and Gynecology

## 2023-07-06 DIAGNOSIS — Z1231 Encounter for screening mammogram for malignant neoplasm of breast: Secondary | ICD-10-CM

## 2023-07-12 ENCOUNTER — Encounter: Payer: Medicare Other | Admitting: Internal Medicine

## 2023-07-18 DIAGNOSIS — G43019 Migraine without aura, intractable, without status migrainosus: Secondary | ICD-10-CM | POA: Diagnosis not present

## 2023-07-22 ENCOUNTER — Other Ambulatory Visit: Payer: Self-pay | Admitting: Internal Medicine

## 2023-07-22 ENCOUNTER — Other Ambulatory Visit: Payer: Self-pay

## 2023-07-22 ENCOUNTER — Encounter: Payer: Self-pay | Admitting: Internal Medicine

## 2023-07-22 ENCOUNTER — Ambulatory Visit: Payer: Medicare Other | Admitting: Internal Medicine

## 2023-07-22 VITALS — BP 110/68 | HR 67 | Temp 98.2°F | Resp 16 | Wt 126.0 lb

## 2023-07-22 DIAGNOSIS — U071 COVID-19: Secondary | ICD-10-CM | POA: Insufficient documentation

## 2023-07-22 MED ORDER — NIRMATRELVIR/RITONAVIR (PAXLOVID) TABLET (RENAL DOSING): 2.0000 | ORAL_TABLET | Freq: Two times a day (BID) | ORAL | 0 refills | Status: AC

## 2023-07-22 MED ORDER — NIRMATRELVIR/RITONAVIR (PAXLOVID) TABLET (RENAL DOSING): 2.0000 | ORAL_TABLET | Freq: Two times a day (BID) | ORAL | 0 refills | Status: DC

## 2023-07-22 NOTE — Assessment & Plan Note (Signed)
Her GFR is 54.  She will need renal dosed paxlovid.  I look at her interactions and I asked her not to take trazodone while on paxlovid.  She is to continue with supportive care.

## 2023-07-22 NOTE — Progress Notes (Signed)
Office Visit  Subjective   Patient ID: Rebecca [name]   DOB: [birth year]   Age: 77 y.o.   MRN: [id number]   Chief Complaint Chief Complaint  Patient presents with   Acute Visit    Covid Positive     History of Present Illness Rebecca [name] is a 77 yo female who comes in today for COVID-19 infection.  She began having cough and headache about 3 days ago.  She tested herself with a home test and was positive.  She denies any problems with breathing with no sinus or chest congestion, SOB, wheezing, nausea, vomiting, fevers, chills or myalgias.  She has not really been taking anything over the counter for this.  Her [facility].     Past Medical History Past Medical History:  Diagnosis Date   Anxiety disorder    Complication of anesthesia 2006   Drop in  Bp intra-op with Lap Chole. [employer].   Depression    GERD (gastroesophageal reflux disease)    History of colon polyps    IBS (irritable bowel syndrome)    Internal hemorrhoids without complication    Migraine      Allergies Allergies  Allergen Reactions   Ciprofloxacin Other (See Comments)   Zonisamide Other (See Comments)    Unsure about this medication    Codeine Nausea Only and Other (See Comments)   Nitrofurantoin Nausea Only   Other Nausea Only     Medications  Current Outpatient Medications:    nirmatrelvir/ritonavir, renal dosing, (PAXLOVID) 10 x 150 MG & 10 x 100MG  TABS, Take 2 tablets by mouth 2 (two) times daily for 5 days. Patient GFR is 54. Take nirmatrelvir (150 mg) one tablet twice daily for 5 days and ritonavir (100 mg) one tablet twice daily for 5 days., Disp: 20 tablet, Rfl: 0   dexlansoprazole (DEXILANT) 60 MG capsule, Take 1 capsule (60 mg total) by mouth daily., Disp: 90 capsule, Rfl: 4   Galcanezumab-gnlm (EMGALITY [place]), Inject into the skin. Pt unsure of dosage, Disp: , Rfl:    metoprolol tartrate (LOPRESSOR) 25 MG tablet, Take 3 tablets (75  mg total) by mouth 2 (two) times daily., Disp: 540 tablet, Rfl: 3   pravastatin (PRAVACHOL) 40 MG tablet, Take 40 mg by mouth at bedtime., Disp: , Rfl:    traZODone (DESYREL) 50 MG tablet, Take 1.5 tablets (75 mg total) by mouth at bedtime., Disp: 100 tablet, Rfl: 1   Zoledronic Acid (RECLAST IV), , Disp: , Rfl:   Current Facility-Administered Medications:    0.9 %  sodium chloride infusion, 500 mL, Intravenous, Once, Lynann Bologna, MD   Review of Systems Review of Systems  Constitutional:  Negative for chills and fever.  Respiratory:  Positive for cough. Negative for sputum production, shortness of breath and wheezing.   Cardiovascular:  Negative for chest pain, palpitations and leg swelling.  Gastrointestinal:  Negative for abdominal pain, constipation, diarrhea, nausea and vomiting.  Neurological:  Negative for dizziness, weakness and headaches.       Objective:    Vitals BP 110/68   Pulse 67   Temp 98.2 F (36.8 C)   Resp 16   Wt 126 lb (57.2 kg)   SpO2 95%   BMI 23.81 kg/m    Physical Examination Physical Exam Constitutional:      Appearance: Normal appearance. She is not ill-appearing.  Cardiovascular:     Rate and Rhythm: Normal rate and regular  rhythm.     Pulses: Normal pulses.     Heart sounds: No murmur heard.    No friction rub. No gallop.  Pulmonary:     Effort: Pulmonary effort is normal. No respiratory distress.     Breath sounds: No wheezing, rhonchi or rales.  Abdominal:     General: Bowel sounds are normal. There is no distension.     Palpations: Abdomen is soft.     Tenderness: There is no abdominal tenderness.  Skin:    General: Skin is warm and dry.     Findings: No rash.  Neurological:     Mental Status: She is alert.        Assessment & Plan:   COVID-19 Her GFR is 54.  She will need renal dosed paxlovid.  I look at her interactions and I asked her not to take trazodone while on paxlovid.  She is to continue with supportive care.     No follow-ups on file.   Crist Fat, MD

## 2023-08-02 ENCOUNTER — Encounter: Payer: Self-pay | Admitting: Student

## 2023-08-02 ENCOUNTER — Ambulatory Visit: Payer: Medicare Other | Admitting: Student

## 2023-08-02 VITALS — BP 104/62 | HR 68 | Temp 98.8°F | Resp 17 | Ht 61.0 in | Wt 126.0 lb

## 2023-08-02 DIAGNOSIS — J3489 Other specified disorders of nose and nasal sinuses: Secondary | ICD-10-CM | POA: Insufficient documentation

## 2023-08-02 MED ORDER — MUPIROCIN CALCIUM 2 % EX CREA: 1.0000 | TOPICAL_CREAM | Freq: Two times a day (BID) | CUTANEOUS | 0 refills | Status: DC

## 2023-08-02 NOTE — Progress Notes (Signed)
Acute Office Visit  Subjective:     Patient ID: Rebecca [name], female    DOB: [birth year], 77 y.o.   MRN: [id number]  Chief Complaint  Patient presents with   Follow-up    One week post positive covid. Ongoing dry cough and inside of rt nostril is red and sore.    HPI Patient is in today for complaint of ongoing dry cough and a dry area on the inside of her nose. 1 week ago she tested positive for Covid, she was prescribed Paxlovid but was unable to get it from the pharmacy. Today she reports a dry cough where she is using an OTC cough medication and mucinex. She denies congestion, nosebleeds, and no septal deviation.  She denies any problems with breathing with no sinus or chest congestion, SOB, wheezing, nausea, vomiting, fevers, chills or myalgias.     Review of Systems  Constitutional: Negative.   HENT: Negative.    Eyes: Negative.   Respiratory:  Positive for cough. Negative for sputum production, shortness of breath and wheezing.   Skin: Negative.   Neurological: Negative.         Objective:    BP 104/62   Pulse 68   Temp 98.8 F (37.1 C)   Resp 17   Ht 5\' 1"  (1.549 m)   Wt 126 lb (57.2 kg)   SpO2 97%   BMI 23.81 kg/m    Physical Exam Constitutional:      Appearance: Normal appearance.  HENT:     Head: Normocephalic and atraumatic.     Right Ear: Tympanic membrane, ear canal and external ear normal.     Left Ear: Tympanic membrane, ear canal and external ear normal.     Nose: Nasal tenderness present. No nasal deformity, septal deviation or rhinorrhea.     Right Nostril: No foreign body, epistaxis, septal hematoma or occlusion.     Left Nostril: No foreign body, epistaxis, septal hematoma or occlusion.     Right Turbinates: Swollen.     Right Sinus: No maxillary sinus tenderness or frontal sinus tenderness.     Left Sinus: No maxillary sinus tenderness or frontal sinus tenderness.     Mouth/Throat:     Mouth: Mucous membranes are moist.      Pharynx: Oropharynx is clear. No oropharyngeal exudate or posterior oropharyngeal erythema.  Eyes:     Conjunctiva/sclera: Conjunctivae normal.  Cardiovascular:     Rate and Rhythm: Normal rate and regular rhythm.     Pulses: Normal pulses.     Heart sounds: Normal heart sounds.  Pulmonary:     Effort: Pulmonary effort is normal.     Breath sounds: Normal breath sounds.  Abdominal:     General: Bowel sounds are normal.     Palpations: Abdomen is soft.  Musculoskeletal:     Cervical back: Normal range of motion.  Skin:    General: Skin is warm and dry.     Capillary Refill: Capillary refill takes less than 2 seconds.  Neurological:     Mental Status: She is alert and oriented to person, place, and time.  Psychiatric:        Mood and Affect: Mood normal.        Behavior: Behavior normal.     No results found for any visits on 08/02/23.      Assessment & Plan:   Problem List Items Addressed This Visit     Nasal discomfort - Primary    Meds ordered this  encounter  Medications   mupirocin cream (BACTROBAN) 2 %    Sig: Apply 1 Application topically 2 (two) times daily.    Dispense:  15 g    Refill:  0    No follow-ups on file.  Edwena Blow, NP

## 2023-08-02 NOTE — Patient Instructions (Signed)
use mupirocin twice a day for 5 days.

## 2023-08-02 NOTE — Assessment & Plan Note (Signed)
nasal vestibulitis in the right nare- she will use mupirocin twice a day for 5 days.

## 2023-08-15 ENCOUNTER — Ambulatory Visit (INDEPENDENT_AMBULATORY_CARE_PROVIDER_SITE_OTHER): Payer: Medicare Other | Admitting: Gastroenterology

## 2023-08-15 ENCOUNTER — Encounter: Payer: Self-pay | Admitting: Gastroenterology

## 2023-08-15 VITALS — BP 110/70 | HR 60 | Ht 59.0 in | Wt 125.0 lb

## 2023-08-15 DIAGNOSIS — R1084 Generalized abdominal pain: Secondary | ICD-10-CM | POA: Diagnosis not present

## 2023-08-15 DIAGNOSIS — K219 Gastro-esophageal reflux disease without esophagitis: Secondary | ICD-10-CM

## 2023-08-15 DIAGNOSIS — K449 Diaphragmatic hernia without obstruction or gangrene: Secondary | ICD-10-CM

## 2023-08-15 DIAGNOSIS — K581 Irritable bowel syndrome with constipation: Secondary | ICD-10-CM

## 2023-08-15 MED ORDER — DEXLANSOPRAZOLE 60 MG PO CPDR: 60.0000 mg | DELAYED_RELEASE_CAPSULE | Freq: Every day | ORAL | 4 refills | Status: AC

## 2023-08-15 NOTE — Patient Instructions (Addendum)
_______________________________________________________  If your blood pressure at your visit was 140/90 or greater, please contact your primary care physician to follow up on this.  _______________________________________________________  If you are age 75 or older, your body mass index should be between 23-30. Your Body mass index is 25.25 kg/m. If this is out of the aforementioned range listed, please consider follow up with your Primary Care Provider.  If you are age 36 or younger, your body mass index should be between 19-25. Your Body mass index is 25.25 kg/m. If this is out of the aformentioned range listed, please consider follow up with your Primary Care Provider.   ________________________________________________________  The [facility] providers would like to encourage you to use [facility] to communicate with providers for non-urgent requests or questions.  Due to long hold times on the telephone, sending your provider a message by [facility] may be a faster and more efficient way to get a response.  Please allow 48 business hours for a response.  Please remember that this is for non-urgent requests.  _______________________________________________________  Continue colace 3 tablets a day  Increase water intake to at least 64oz of water  We have sent the following medications to your pharmacy for you to pick up at your convenience: Dexilant 60mg  daily  Please follow up in 12 months. Give [place] a call at [phone] to schedule an appointment.  Thank you,  Dr. Lynann Bologna

## 2023-08-15 NOTE — Progress Notes (Signed)
Chief Complaint: FU  Referring Provider:  Leonia Reader, Barbara Cower, MD      ASSESSMENT AND PLAN;   #1. Gen abdo pain (after fall)- neg CT AP 01/2023-resolved.  #2. H/O tubular adenomas.  Next colon due 07/2026 only if medically stable.  #3. IBS-C (H/O diarrhea in the past). Neg colon 07/2019 except for mild sigmoid diverticulosis, small tubular adenomas.   #4. GERD with small HH and occ dysphagia d/t presbyesophagus s/p dil Jan 2023  #5. FH metastatic pancreatic cancer (sister).  Plan: -Continue colace 3/day.  -Increase water intake. -Decrease Dexilant 60mg  po qod #90, 4 refills -FU in 1 yr.   HPI:    Rebecca [name] is a 77 y.o. female  Goes to same church as Victorino Dike For follow-up visit  Feels much better  Abdo pain after fall Nov 2023 (as she was getting out of the truck)- frcture ribs/vertebrae  Negative CT AP 01/11/2023: No acute findings within the abdomen or pelvis. Tiny left renal calculi. No evidence of ureteral calculi or hydronephrosis. Aortic Atherosclerosis (ICD10-I70.0).  Gyn [place] neg  She denies having any nausea or vomiting.  Has chronic intermittent dysphagia, better after previous dilatation.  No heartburn as long as she takes Dexilant.  Constipation is under good control with Colace 3/day.  She does drink plenty of water and uses MiraLAX on as-needed basis.  No melena or hematochezia.   Additional GI history: -Sister had metastatic pancreatic cancer -History of abnormal liver function tests due to fatty liver diagnosed 2002, had liver biopsy 12/2004.  Negative autoimmune hepatitis profile, acute hepatitis profile, ANA  Previous GI workup: Negative CT AP 01/11/2023 with contrast. No acute findings within the abdomen or pelvis. Tiny left renal calculi. No evidence of ureteral calculi or hydronephrosis. Aortic Atherosclerosis (ICD10-I70.0).  EGD 12/01/2021 -Mild presbyesophagus s/p dil 50 FR -2 cm HH -Atrophic gastritis -Few gastric polyps-previously  determined to be fundic gland polyps. -Neg gastric Bx  EGD 07/24/2019 -Small HH, mild gastritis -Multiple gastric polyps s/p polypectomy x 3/ Bx-fundic gland polyps -Negative gastric and small bowel biopsies.   Colon: 07/24/2019 -Colonic polyps s/p polypectomy. -Mild sigmoid diverticulosis -Otherwise normal colonoscopy to TI.  Highly redundant colon. -Bx- TA -Rpt 2027 (if needed)  Neg CT scan of the abdomen/pelvis February 2019 Past Medical History:  Diagnosis Date   Anxiety disorder    Complication of anesthesia 2006   Drop in  Bp intra-op with Lap Chole. [employer].   Depression    GERD (gastroesophageal reflux disease)    History of colon polyps    IBS (irritable bowel syndrome)    Internal hemorrhoids without complication    Migraine     Past Surgical History:  Procedure Laterality Date   ABDOMINAL HYSTERECTOMY  1984   APPENDECTOMY  1967   AUGMENTATION MAMMAPLASTY Bilateral    BLADDER SUSPENSION  06/23/2011   Procedure: TRANSVAGINAL TAPE (TVT) PROCEDURE;  Surgeon: Loney Laurence;  Location: [facility];  Service: Gynecology;  Laterality: N/A;   BREAST SURGERY     implants due to removal of breast tissue   CATARACT EXTRACTION     Inserted prosthetic lens   CHOLECYSTECTOMY     due to biliary dyskinesia   COLONOSCOPY  10/11/2016   Minimal sigmoid diverticulosis. Otherwise normal colonsocopy.    CYSTOCELE REPAIR  06/23/2011   Procedure: ANTERIOR REPAIR (CYSTOCELE);  Surgeon: Loney Laurence;  Location: [facility];  Service: Gynecology;  Laterality: N/A;   CYSTOSCOPY  06/23/2011   Procedure: CYSTOSCOPY;  Surgeon:  Loney Laurence;  Location: [facility];  Service: Gynecology;  Laterality: N/A;   DNS s/p nasal surgery     ESOPHAGOGASTRODUODENOSCOPY  03/03/2016   Small hiatal hernia. Incidental gastric polyps. Mild gastritis.    EYE MUSCLE SURGERY  as child   HERNIA REPAIR  12/30/2010   inguinal. performed by Dr Georgiana Shore    KNEE ARTHROPLASTY  2006   MASTECTOMY Bilateral     pt states that 95% of tissue removed   OVARIAN CYST REMOVAL     bilateral   RECTOCELE REPAIR  11/10/2011   Procedure: POSTERIOR REPAIR (RECTOCELE);  Surgeon: Loney Laurence, MD;  Location: [facility];  Service: Gynecology;  Laterality: N/A;   RECTOCELE REPAIR N/A 07/03/2014   Procedure: revision of POSTERIOR REPAIR (RECTOCELE) with bladder instillation ;  Surgeon: Loney Laurence, MD;  Location: [facility];  Service: Gynecology;  Laterality: N/A;   RECTOCELE REPAIR N/A 07/03/2014   Procedure: POSTERIOR REPAIR (RECTOCELE) with suture repair for hemostasis ;  Surgeon: Loney Laurence, MD;  Location: [facility];  Service: Gynecology;  Laterality: N/A;   ROTATOR CUFF REPAIR Left    SHOULDER SURGERY Right 06/2019    Family History  Problem Relation Age of Onset   Breast cancer Mother        diagnosed in her 57s   Pancreatic cancer Sister        metastatic   Cancer Brother        81   Cancer Maternal Grandmother    Cancer Maternal Grandfather    Cancer Nephew    Cancer Niece    Colon cancer Neg Hx    Esophageal cancer Neg Hx    Rectal cancer Neg Hx    Stomach cancer Neg Hx     Social History   Tobacco Use   Smoking status: Former    Types: Cigarettes   Smokeless tobacco: Never   Tobacco comments:    quit 1984  Vaping Use   Vaping status: Never Used  Substance Use Topics   Alcohol use: No   Drug use: No    Current Outpatient Medications  Medication Sig Dispense Refill   Cyclobenzaprine HCl (FLEXERIL PO) Take 1 tablet by mouth as needed.     dexlansoprazole (DEXILANT) 60 MG capsule Take 1 capsule (60 mg total) by mouth daily. 90 capsule 4   Galcanezumab-gnlm (EMGALITY [place]) Inject into the skin every 30 (thirty) days. Pt unsure of dosage     metoprolol tartrate (LOPRESSOR) 25 MG tablet Take 3 tablets (75 mg total) by mouth 2 (two) times daily. 540 tablet 3   mupirocin cream (BACTROBAN) 2 % Apply 1 Application topically 2 (two) times daily. 15 g 0   pravastatin (PRAVACHOL) 40  MG tablet Take 40 mg by mouth at bedtime.     traZODone (DESYREL) 50 MG tablet Take 1.5 tablets (75 mg total) by mouth at bedtime. 100 tablet 1   Zoledronic Acid (RECLAST IV) Yearly     Current Facility-Administered Medications  Medication Dose Route Frequency Provider Last Rate Last Admin   0.9 %  sodium chloride infusion  500 mL Intravenous Once Lynann Bologna, MD        Allergies  Allergen Reactions   Ciprofloxacin Other (See Comments)   Zonisamide Other (See Comments)    Unsure about this medication    Codeine Nausea Only and Other (See Comments)   Nitrofurantoin Nausea Only   Other Nausea Only    Review of Systems:  neg  Physical Exam:    BP 110/70 (BP Location: Left Arm, Patient Position: Sitting, Cuff Size: Normal)   Pulse 60   Ht 4\' 11"  (1.499 m) Comment: height measured without shoes  Wt 125 lb (56.7 kg)   BMI 25.25 kg/m  Filed Weights   08/15/23 1044  Weight: 125 lb (56.7 kg)  Gen: awake, alert, NAD HEENT: anicteric, no pallor CV: RRR, no mrg Pulm: CTA b/l Abd: soft, epigastric and right lower quadrant abdominal tenderness.  No definite masses.  +BS throughout Ext: no c/c/e Neuro: nonfocal      Edman Circle, MD 08/15/2023, 11:12 AM  Cc: Crist Fat, MD

## 2023-08-29 ENCOUNTER — Encounter: Payer: Self-pay | Admitting: Internal Medicine

## 2023-08-29 ENCOUNTER — Ambulatory Visit: Payer: Medicare Other | Admitting: Internal Medicine

## 2023-08-29 VITALS — BP 120/78 | HR 61 | Temp 97.9°F | Resp 18 | Ht 61.0 in | Wt 126.0 lb

## 2023-08-29 DIAGNOSIS — R002 Palpitations: Secondary | ICD-10-CM

## 2023-08-29 DIAGNOSIS — M542 Cervicalgia: Secondary | ICD-10-CM

## 2023-08-29 DIAGNOSIS — Z23 Encounter for immunization: Secondary | ICD-10-CM

## 2023-08-29 DIAGNOSIS — K589 Irritable bowel syndrome without diarrhea: Secondary | ICD-10-CM | POA: Diagnosis not present

## 2023-08-29 DIAGNOSIS — M8000XD Age-related osteoporosis with current pathological fracture, unspecified site, subsequent encounter for fracture with routine healing: Secondary | ICD-10-CM

## 2023-08-29 DIAGNOSIS — I700 Atherosclerosis of aorta: Secondary | ICD-10-CM

## 2023-08-29 DIAGNOSIS — I10 Essential (primary) hypertension: Secondary | ICD-10-CM

## 2023-08-29 DIAGNOSIS — E559 Vitamin D deficiency, unspecified: Secondary | ICD-10-CM | POA: Diagnosis not present

## 2023-08-29 DIAGNOSIS — E7800 Pure hypercholesterolemia, unspecified: Secondary | ICD-10-CM | POA: Diagnosis not present

## 2023-08-29 DIAGNOSIS — G4700 Insomnia, unspecified: Secondary | ICD-10-CM

## 2023-08-29 DIAGNOSIS — D696 Thrombocytopenia, unspecified: Secondary | ICD-10-CM

## 2023-08-29 DIAGNOSIS — K219 Gastro-esophageal reflux disease without esophagitis: Secondary | ICD-10-CM | POA: Insufficient documentation

## 2023-08-29 DIAGNOSIS — Z6823 Body mass index (BMI) 23.0-23.9, adult: Secondary | ICD-10-CM

## 2023-08-29 DIAGNOSIS — G43009 Migraine without aura, not intractable, without status migrainosus: Secondary | ICD-10-CM | POA: Insufficient documentation

## 2023-08-29 DIAGNOSIS — D709 Neutropenia, unspecified: Secondary | ICD-10-CM

## 2023-08-29 DIAGNOSIS — S22000D Wedge compression fracture of unspecified thoracic vertebra, subsequent encounter for fracture with routine healing: Secondary | ICD-10-CM

## 2023-08-29 MED ORDER — PRAVASTATIN SODIUM 40 MG PO TABS: 40.0000 mg | ORAL_TABLET | Freq: Every day | ORAL | 3 refills | Status: DC

## 2023-08-29 NOTE — Assessment & Plan Note (Signed)
She is followed by DR. Neale Burly and he has changed her to [employer].  She states she has not had a migraine since 2013 but has headaches.

## 2023-08-29 NOTE — Assessment & Plan Note (Signed)
She has seen ortho and her vertebral compression fractures are healing.  She can take tylenol for any pain.

## 2023-08-29 NOTE — Assessment & Plan Note (Signed)
She has had multiple thoracic compression fractures and is currently receiving iv reclast with her next dose due in 04/2024.  We will repeat a bone density next year once that is done.  I will check a Vit D level on her today.

## 2023-08-29 NOTE — Assessment & Plan Note (Signed)
She will continue a diet with fiber and continue colace.

## 2023-08-29 NOTE — Assessment & Plan Note (Addendum)
She will followup with Dr. Belva Bertin or neurospine for any neck pain.  I had sent her to be looked at for an occiptal nerve block.

## 2023-08-29 NOTE — Assessment & Plan Note (Signed)
I want her to eat healthy and keep active with exercise.

## 2023-08-29 NOTE — Assessment & Plan Note (Signed)
She is currently on a statin.  We will continue to control her BP and cholesterol.

## 2023-08-29 NOTE — Assessment & Plan Note (Signed)
Her BP is well controlled.  Continue on her current metoprolol dose.  She has a history of palpitations but this is controlled on metoprolol as well.

## 2023-08-29 NOTE — Assessment & Plan Note (Signed)
She was seen by Hematology.  We will continue to monitor her platelet count.

## 2023-08-29 NOTE — Assessment & Plan Note (Signed)
AS above

## 2023-08-29 NOTE — Assessment & Plan Note (Signed)
We will check her FLP today since she is on pravastatin.

## 2023-08-29 NOTE — Progress Notes (Unsigned)
Preventive Screening-Counseling & Management     Rebecca [name] is a 77 y.o. female who presents for Medicare Annual/Subsequent preventive examination.  Rebecca [name] is a 77 year old Caucasian/White female who presents for her annual wellness exam. She is due for the following health maintenance studies: mammogram, visual exam, and screening labs. This patient's past medical history Aortic Atherosclerosis, Gall stones, Gallbladder Dysfunction, GERD, Kidney Stones, Migraine, and Leukopenia.   Her last eye exam was on 05/02/2023 where she denies any problems with her vision.  She has had cataract surgery in the past.  She states her sister had died from pancreatic cancer. She had a previous colonoscopy and EGD done on 07/2019 which showed 2 polyps in her colon that were benign and her EGD showed a small hiatal hernia with gastritis as well as benign gastric polyps. They want to repeat a colonoscopy in 07/2026. She has a history of GERD which is controlled on Dexilant.  Her last mammogram was done on 07/06/2023 and this was normal.  She was seen by [facility] on 05/30/2023 where she was having some abdominal pain and they did an abdominal US which she states was normal.  They did a PAP on her in 2021 and they told her it was normal and she will not need anymore PAPs.  The patient does not exercise regularly.  She has a history of tobacco abuse where she quit smoking at age 60.  She started smoking at age 27 and only smoked 5-6 cigarettes per day.   She does get yearly flu vaccines. She had her Prevnar 13 in 2016 and Pneumovax 23 in 2017. She has declined the shingles vaccine. She had 3 COVID-19 vaccines including one booster. The patient denies any depression, anxiety or memory loss. She is not on an ASA.   The patient also contracted COVID-19 last month in 07/2023.  I saw her and wrote her for Paxlovid but she was unable to obtain this.  She did resolved with her infection.  She denies any  sequalae from COVID-19.  She also contracted COVID-19 in 07/2022.    Mrs. Rebecca [name] has a known history of osteoporosis as well as a history of scoliosis and thoracic vertebral compression fractures.  This past year,  she fell where she slipped while it was raining in 09/2022 and was discovered to have a new mild compression fracture of T9.  She went to the ER and they did a CT of her chest which showed a new mild compression fracture at T9 but no acute rib fractures.  She tells me they told her she had rib fractures and I asked her why she didn't come see me after this and she does not know why.  She was sent home in a TLSO brace and followed up with ortho where he recommended conservative treatment due to her history of prior compression fracture and severe osteoporosis.  She was given oxycodone at that time.  She did go to see ortho in 12/2022 but the patient was told that her compression fracture was healing.  She states she has not had any repeat xrays.  [facility] [place]. She had an accidental fall in 10/2020 where she slipped on ice and sustained T4 and T5 vertebral fractures.  She did see the [facility] where it sounds like they did vertebroplasty on one of her vertebra.  She was placed on flexeril and ultram at that time.  She states she has had back pain since she was a child and has a history of scoliosis.  She can have back pain when she stands for a while.  She will have back pain maybe 4 days of the week.  Her back pain can be intermittent and severe at times but this depends on what she does.  Staying on her feet for long periods of time makes it worse.  There is no loss of bowel/bladder function and she denies any new weakness/numbness.  We did a chest xray at that time looking for a rib fracture.  This was done in 02/2021 and showed prior upper thoracic vertebroplasty with multiple thoracic spine compression fractures some which are new  compared to her MRI done on 01/09/2021.  She did go see [facility] these new compression fractures.  They did not repeat a MRI but she states they gave her ESI's which did help with her pain.  She states they are going to do a radiofrequency ablation to nerve but this was never done.  I sent her for a bone density which was done in 03/03/2022 and this showed a t-score of -6.1.  We therefore set her up for reclast injections where she received an infusion on 04/08/2022 and her 2nd injection wasd done on 04/11/2023.  She had no side effects from her iv reclast.     Mrs. Rebecca [name] returns today for her neck pain.  Over the interim, it has not worsened but it has not improved and has been stable.  Again, this pain initially began after sustain a fall in 10/2020 where she slipped on ice and sustained T4 and T5 vertebral fractures.  We did obtain a MRI of her cervical spine last year in 04/2022.  She saw me about 3-4 weeks prior due to chronic headaches.  She does have a history of migraines where she states they initially started in 2005 however they went away and she was not effected by migraines for years.  These migraines were located in a bifrontal type distribution.  She denied any auras but she would have photophobia and phonophobia.  She is following with Dr [facility]ale [facility] where she is currently on emgality and prn Cyclobenzaprine 10mg  3 times a day as needed. She has also used Imipramine and Botox in the past.  Her Botox is no longer covered by her insurance anymore.  She last saw Dr. Neale Burly from neurology in 07/18/2023 where she was changed from aimovig to emaglity.  There is no focal deficit associated with these headaches.  I did start her on a trial of gabapentin 200mg  qhs but she states this did not help with her headaches.  We therefore ordered a MRI of her cervical spine which was done on 04/23/2022.  This showed trace grade 1 anterolisthesis of C5 on C6 with multilevel  degenerative disc disease.  She had unchanged chronic mild T3 and moderate to high grade T5 vertebral body compression fractures (see below).  She had very mild left C3-C4 and C4-C5 neuroforaminal narrowing without true stenosis.  There was mild posteroinferior T4 vertebral retropulsion mildly contacting the ventral cord that is unchanged from her prior MRI compared to her scans in 2014 and 2022.  I referred her to [facility] whom she saw on 05/24/2022 where he noted she had worsening left sided headaches.  He felt a component of this was occiptial neuralgia and he did not recommend any surgical intervention.  He felt that it may be worthwhile to try an occipital nerve block.  He also left it up to Dr. Neale Burly to decided whether cervical spine injections would be worthwhile and they could refer her to their pain management specialist.   I felt she has cervicogenic headaches with component of occiptogenic. I think she has a neuropathic and arthritic component to her pain at looking at her MRI. I discontinued her gabapentin in 05/2022 and tried her on lyrica 50mg  BID with Celebrex 200mg  daily x 2 weeks to see if this helps.  She states that this did helped initally but then it stopped helping with her neck pain and she stopped the medications.  She was also on tramadol at that time but she tells me that she did not have to take extra tramadol to help control her pain.  Today, there is no loss of bowel/bladder function and no new weakness/numbness of hand or feet.  I sent her to see Dr. Belva Bertin to look at a occiptial nerve block.  She saw him in 05/2023 (I do not have the notes) and she states they are just going to follow this.    The patient is a 77 year old Caucasian/White female who presents for a follow-up evaluation of hypertension.  Her blood pressure was borderline this past year.  The patient has been checking her blood pressure at home. She states her systolic BP runs in the 120's.  The patient's  current medications include: metoprolol 75mg  BID. The patient has been tolerating her medications well. The patient denies any headache, visual changes, dizziness, lightheadness, chest pain, shortness of breath, weakness/numbness, and edema.   The patient also has a history of dysphagia.  I referred her to Dr. Chales Abrahams in 2023 where she underwent an EGD on 12/01/2021 which showed a mild presbyesophagus where she was dilated. They also found a small hiatal hernia and she had a few gastric polyps and some atrophic gastritis. She was placed on Carafate and dexliant in the past. She states her sister had died from pancreatic cancer.  She has a history of IBS predominantly constipation where she takes miralax and colace.  She last saw Dr. Chales Abrahams on 08/15/2023 where he noted that her abdominal pain after her fall from last year had resolved.  He wanted her to continue colace 3/day, increase water intake, and decrease her dose of Dexilant to every other day.   She denies any problems with nausea, vomiting or dysphagia today.       We did refer her to hematology for leukopenia and thrombocytopenia.  We noted on routine lab work with her yearly exam in 05/2021 that her platelet count was mildly low at a level of 137.  We repeated this again in 3 months and her level was 141.  However on repeat in 08/2021 we also noted her WBC was mildly decreased to 3.3.  We were going to monitor this but she did see Dr. Chales Abrahams in GI  where she was having dysphagia and plans to do an esophageal dilatation.  He did blood work on 10/12/2021 and this shows that her platelet and WBC are still low.  She last saw Hematology on 03/10/2023 and she felt her thrombocytopenia was mild, and currently resolved. She has been running between 130-140,000. Evaluation was negative. If her counts start to fall again, we can consider bone marrow evaluation, but her blood count abnormalities are very mild at this time.  Her leukopenia with neutropenia was improved.  This  has been fluctuating up and down. This could represent cyclic neutropenia, but we wouldn't expect thrombocytopenia with that.  There was no evidence of nutritional deficiency or multiple myeloma, so this is likely to be myelodysplasia, which would be diagnosed with a bone marrow.  Today, she denies any bleeding or bruising and she has not had problems with infection.     Arman Bogus returns today for routine followup on her cholesterol. Overall, she states she is doing well and is without any complaints or problems at this time. She specifically denies abdominal pain, nausea, vomiting, diarrhea, myalgias, and fatigue. She remains on dietary management as well as the following cholesterol lowering medications pravastatin 40 mg daily. She is fasting in anticipation for labs today.   She has a history of palpitations and DOE.  This is controlled with her metoprolol where she is on 75mg  po BID.  She had an ECHO performed on 03/2017 which showed mild concentric LVH but a normal systolic function with an EF of 57%. She went for a Holter monitor and this showed multiple PAC's but was otherwise normal. She denies any chest pain, arm pain or neck pain and no SOB at rest, PND, edema or other problems.      She also has a history of kidney stones.  She has seen urology in the past where they have done lithotripsy in the past.    She has had a history of insomnia since about 2013.  She states she has problems getting to sleep but not staying asleep.  She denies any snoring.  She is on trazodone 75mg  qhs which seems to help.  She is also followed by [facility] for varicose veins with irritation.  She states she has had some injections in her varicose veins but she has not recently followed up with them.        Are there smokers in your home (other than you)? No  Risk Factors Current exercise habits:  as above   Dietary issues discussed: none   Depression Screen (Note: if answer to  either of the following is "Yes", a more complete depression screening is indicated)   Over the past two weeks, have you felt down, depressed or hopeless? No  Over the past two weeks, have you felt little interest or pleasure in doing things? No  Have you lost interest or pleasure in daily life? No  Do you often feel hopeless? No  Do you cry easily over simple problems? No  Activities of Daily Living In your present state of health, do you have any difficulty performing the following activities?:  Driving? No Managing money?  No Feeding yourself? No Getting from bed to chair? No Climbing a flight of stairs? No Preparing food and eating?: No Bathing or showering? No Getting dressed: No Getting to the toilet? No Using the toilet:No Moving around from place to place: No In the past year have you fallen or had a near fall?:Yes   Are you sexually active?  No  Do you have more than one partner?  No  Hearing Difficulties: No Do you often ask people to speak up or repeat themselves? No Do you experience ringing or noises in your ears? No Do you have difficulty understanding soft or whispered voices? No   Do you feel that you have a problem with memory? No  Do you often misplace items? No  Do you feel safe at home?  Yes  Cognitive Testing  Alert? Yes  Normal Appearance?Yes  Oriented to person? Yes  Place? Yes   Time? Yes  Recall of three objects?  Yes  Can perform simple calculations? Yes  Displays appropriate judgment?Yes  Can read the correct time from a watch face?Yes  Fall Risk Prevention  Any stairs in or around the home? No  If so, are there any without handrails? No  Home free of loose throw rugs in walkways, pet beds, electrical cords, etc? Yes  Adequate lighting in your home to reduce risk of falls? Yes  Use of a cane, walker or w/c? No    Time Up and Go  Was the test performed? Yes .  Length of time to ambulate 10 feet: 13 sec.   Gait steady and fast without  use of assistive device    Advanced Directives have been discussed with the patient? Yes   List the Names of Other Physician/Practitioners you currently use: Patient Care Team: Crist Fat, MD as PCP - General (Internal Medicine)    Past Medical History:  Diagnosis Date   Anxiety disorder    Aortic atherosclerosis (HCC)    Complication of anesthesia 2006   Drop in  Bp intra-op with Lap Chole. [employer].   Depression    Dysfunctional gallbladder    GERD (gastroesophageal reflux disease)    History of colon polyps    IBS (irritable bowel syndrome)    Internal hemorrhoids without complication    Migraine     Past Surgical History:  Procedure Laterality Date   ABDOMINAL HYSTERECTOMY  1984   APPENDECTOMY  1967   AUGMENTATION MAMMAPLASTY Bilateral    BLADDER SUSPENSION  06/23/2011   Procedure: TRANSVAGINAL TAPE (TVT) PROCEDURE;  Surgeon: Loney Laurence;  Location: [facility];  Service: Gynecology;  Laterality: N/A;   BREAST SURGERY     implants due to removal of breast tissue   CATARACT EXTRACTION     Inserted prosthetic lens   CHOLECYSTECTOMY     due to biliary dyskinesia   COLONOSCOPY  10/11/2016   Minimal sigmoid diverticulosis. Otherwise normal colonsocopy.    CYSTOCELE REPAIR  06/23/2011   Procedure: ANTERIOR REPAIR (CYSTOCELE);  Surgeon: Loney Laurence;  Location: [facility];  Service: Gynecology;  Laterality: N/A;   CYSTOSCOPY  06/23/2011   Procedure: CYSTOSCOPY;  Surgeon: Loney Laurence;  Location: [facility];  Service: Gynecology;  Laterality: N/A;   DNS s/p nasal surgery     ESOPHAGOGASTRODUODENOSCOPY  03/03/2016   Small hiatal hernia. Incidental gastric polyps. Mild gastritis.    EYE MUSCLE SURGERY  as child   HERNIA REPAIR  12/30/2010   inguinal. performed by Dr Georgiana Shore    KNEE ARTHROPLASTY  2006   MASTECTOMY Bilateral    pt states that 95% of tissue removed   OVARIAN CYST REMOVAL     bilateral   RECTOCELE REPAIR  11/10/2011   Procedure: POSTERIOR  REPAIR (RECTOCELE);  Surgeon: Loney Laurence, MD;  Location: [facility];  Service: Gynecology;  Laterality: N/A;   RECTOCELE REPAIR N/A 07/03/2014   Procedure: revision of POSTERIOR REPAIR (RECTOCELE) with bladder instillation ;  Surgeon: Loney Laurence, MD;  Location: [facility];  Service: Gynecology;  Laterality: N/A;   RECTOCELE REPAIR N/A 07/03/2014   Procedure: POSTERIOR REPAIR (RECTOCELE) with suture repair for hemostasis ;  Surgeon: Loney Laurence, MD;  Location: [facility];  Service: Gynecology;  Laterality: N/A;   ROTATOR CUFF REPAIR Left    SHOULDER SURGERY Right 06/2019      Current Medications  Current Outpatient Medications  Medication Sig Dispense Refill   Cyclobenzaprine HCl (FLEXERIL PO) Take 1 tablet by mouth as needed.     dexlansoprazole (DEXILANT) 60 MG capsule Take 1 capsule (60 mg total) by mouth daily. 90 capsule 4   Galcanezumab-gnlm (EMGALITY [place]) Inject into the skin every 30 (thirty) days. Pt unsure of dosage     metoprolol tartrate (LOPRESSOR) 25 MG tablet Take 3 tablets (75 mg total) by mouth 2 (two) times daily. 540 tablet 3   mupirocin cream (BACTROBAN) 2 % Apply 1 Application topically 2 (two) times daily. 15 g 0   pravastatin (PRAVACHOL) 40 MG tablet Take 40 mg by mouth at bedtime.     traZODone (DESYREL) 50 MG tablet Take 1.5 tablets (75 mg total) by mouth at bedtime. 100 tablet 1   Current Facility-Administered Medications  Medication Dose Route Frequency Provider Last Rate Last Admin   0.9 %  sodium chloride infusion  500 mL Intravenous Once Lynann Bologna, MD        Allergies Ciprofloxacin, Zonisamide, Codeine, Nitrofurantoin, and Other   Social History Social History   Tobacco Use   Smoking status: Former    Types: Cigarettes   Smokeless tobacco: Never   Tobacco comments:    quit 1984  Substance Use Topics   Alcohol use: No     Review of Systems Review of Systems  Constitutional:  Negative for chills, fever, malaise/fatigue and weight loss.   Eyes:  Negative for blurred vision and double vision.  Respiratory:  Negative for cough, hemoptysis, shortness of breath and wheezing.   Cardiovascular:  Negative for chest pain, palpitations and leg swelling.  Gastrointestinal:  Negative for abdominal pain, blood in stool, constipation, diarrhea, heartburn, melena, nausea and vomiting.  Genitourinary:  Negative for frequency and hematuria.  Musculoskeletal:  Negative for myalgias.  Skin:  Negative for itching and rash.  Neurological:  Negative for dizziness, weakness and headaches.  Endo/Heme/Allergies:  Negative for polydipsia.  Psychiatric/Behavioral:  Negative for depression. The patient is not nervous/anxious.      Physical Exam:      Body mass index is 23.81 kg/m. BP 120/78   Pulse 61   Temp 97.9 F (36.6 C)   Resp 18   Ht 5\' 1"  (1.549 m)   Wt 126 lb (57.2 kg)   SpO2 97%   BMI 23.81 kg/m   Physical Exam Constitutional:      Appearance: Normal appearance. She is not ill-appearing.  HENT:     Head: Normocephalic and atraumatic.     Right Ear: Ear canal and external ear normal.     Left Ear: Tympanic membrane, ear canal and external ear normal.     Ears:     Comments: There is fluid with air fluid level on her right ear but this is a clear serous effusion.    Nose: Nose normal. No congestion or rhinorrhea.     Mouth/Throat:     Mouth: Mucous membranes are moist.     Pharynx: Oropharynx is clear. No posterior oropharyngeal erythema.  Eyes:     General: No scleral icterus.    Conjunctiva/sclera: Conjunctivae normal.     Pupils: Pupils are equal, round, and reactive to light.  Neck:     Thyroid: No thyromegaly.     Vascular: No carotid bruit.  Cardiovascular:     Rate and Rhythm: Normal rate and regular rhythm.     Pulses: Normal pulses.     Heart sounds: Normal heart sounds. No murmur  heard.    No friction rub. No gallop.  Pulmonary:     Effort: Pulmonary effort is normal. No respiratory distress.     Breath  sounds: Normal breath sounds. No wheezing, rhonchi or rales.  Abdominal:     General: Abdomen is flat. Bowel sounds are normal. There is no distension.     Palpations: Abdomen is soft.     Tenderness: There is no abdominal tenderness.  Musculoskeletal:     Cervical back: Normal range of motion. No tenderness.     Right lower leg: No edema.     Left lower leg: No edema.     Comments: No clubbing or cyanosis  Lymphadenopathy:     Cervical: No cervical adenopathy.  Skin:    General: Skin is warm and dry.     Findings: No rash.  Neurological:     General: No focal deficit present.     Mental Status: She is alert and oriented to person, place, and time.     Comments: CN II-XII grossly intact  Psychiatric:        Mood and Affect: Mood normal.        Behavior: Behavior normal.      Assessment:      Hypercholesterolemia  Aortic atherosclerosis (HCC)  Essential hypertension  Migraine without aura and without status migrainosus, not intractable  Gastroesophageal reflux disease, unspecified whether esophagitis present  Age-related osteoporosis with current pathological fracture with routine healing, subsequent encounter  Compression fracture of thoracic vertebra with routine healing, unspecified thoracic vertebral level, subsequent encounter  Neutropenia, unspecified type (HCC)  Thrombocytopenia (HCC)  Irritable bowel syndrome, unspecified type  Insomnia, unspecified type  Neck pain  BMI 23.0-23.9, adult  Vitamin D deficiency  Palpitations    Plan:     During the course of the visit the patient was educated and counseled about appropriate screening and preventive services including:   Pneumococcal vaccine  Influenza vaccine Screening mammography Bone densitometry screening Colorectal cancer screening Advanced directives: discussed  Diet review for nutrition referral? Yes ____  Not Indicated __X__   Patient Instructions (the written plan) was given to the  patient.  Aortic atherosclerosis (HCC) She is currently on a statin.  We will continue to control her BP and cholesterol.  Essential hypertension Her BP is well controlled.  Continue on her current metoprolol dose.  She has a history of palpitations but this is controlled on metoprolol as well.  Migraine without aura and without status migrainosus, not intractable She is followed by DR. Neale Burly and he has changed her to [employer].  She states she has not had a migraine since 2013 but has headaches.    Gastroesophageal reflux disease Dr. Chales Abrahams asked her to start taking her dexilant every other day.  We will continue to monitor.  Irritable bowel syndrome She will continue a diet with fiber and continue colace.  Age-related osteoporosis with current pathological fracture She has had multiple thoracic compression fractures and is currently receiving iv reclast with her next dose due in 04/2024.  We will repeat a bone density next year once that is done.  I will check a Vit D level on her today.  Compression fracture of thoracic vertebra with routine healing She has seen ortho and her vertebral compression fractures are healing.  She can take tylenol for any pain.  Thrombocytopenia (HCC) She was seen by Hematology.  We will continue to monitor her platelet count.  BMI 23.0-23.9, adult I want her to eat healthy and  keep active with exercise.  Hypercholesterolemia We will check her FLP today since she is on pravastatin.  Insomnia She will continue on trazodone for sleep.  Leukopenia We will recheck her WBC today.  She was seen by hematology.  Neck pain She will followup with Dr. Belva Bertin or neurospine for any neck pain.  I had sent her to be looked at for an occiptal nerve block.  Palpitations AS above.   Prevention Health maintenance was discussed.  She needs iv reclast next year.  We will obtain some yearly labs.  Medicare Attestation I have personally reviewed: The  patient's medical and social history Their use of alcohol, tobacco or illicit drugs Their current medications and supplements The patient's functional ability including ADLs,fall risks, home safety risks, cognitive, and hearing and visual impairment Diet and physical activities Evidence for depression or mood disorders  The patient's weight, height, and BMI have been recorded in the chart.  I have made referrals, counseling, and provided education to the patient based on review of the above and I have provided the patient with a written personalized care plan for preventive services.     Crist Fat, MD   08/29/2023

## 2023-08-29 NOTE — Assessment & Plan Note (Signed)
Dr. Chales Abrahams asked her to start taking her dexilant every other day.  We will continue to monitor.

## 2023-08-29 NOTE — Assessment & Plan Note (Signed)
We will recheck her WBC today.  She was seen by hematology.

## 2023-08-29 NOTE — Assessment & Plan Note (Signed)
She will continue on trazodone for sleep.

## 2023-08-30 LAB — CBC WITH DIFFERENTIAL/PLATELET
Basophils Absolute: 0 10*3/uL (ref 0.0–0.2)
Basos: 0 %
EOS (ABSOLUTE): 0.1 10*3/uL (ref 0.0–0.4)
Eos: 3 %
Hematocrit: 41 % (ref 34.0–46.6)
Hemoglobin: 13.4 g/dL (ref 11.1–15.9)
Immature Grans (Abs): 0 10*3/uL (ref 0.0–0.1)
Immature Granulocytes: 0 %
Lymphocytes Absolute: 1 10*3/uL (ref 0.7–3.1)
Lymphs: 22 %
MCH: 31 pg (ref 26.6–33.0)
MCHC: 32.7 g/dL (ref 31.5–35.7)
MCV: 95 fL (ref 79–97)
Monocytes Absolute: 0.4 10*3/uL (ref 0.1–0.9)
Monocytes: 9 %
Neutrophils Absolute: 2.9 10*3/uL (ref 1.4–7.0)
Neutrophils: 66 %
Platelets: 138 10*3/uL — ABNORMAL LOW (ref 150–450)
RBC: 4.32 x10E6/uL (ref 3.77–5.28)
RDW: 11.1 % — ABNORMAL LOW (ref 11.7–15.4)
WBC: 4.5 10*3/uL (ref 3.4–10.8)

## 2023-08-30 LAB — CMP14 + ANION GAP
ALT: 25 [IU]/L (ref 0–32)
AST: 29 [IU]/L (ref 0–40)
Albumin: 4.4 g/dL (ref 3.8–4.8)
Alkaline Phosphatase: 67 [IU]/L (ref 44–121)
Anion Gap: 11 mmol/L (ref 10.0–18.0)
BUN/Creatinine Ratio: 19 (ref 12–28)
BUN: 17 mg/dL (ref 8–27)
Bilirubin Total: 0.5 mg/dL (ref 0.0–1.2)
CO2: 24 mmol/L (ref 20–29)
Calcium: 9.6 mg/dL (ref 8.7–10.3)
Chloride: 105 mmol/L (ref 96–106)
Creatinine, Ser: 0.88 mg/dL (ref 0.57–1.00)
Globulin, Total: 2.1 g/dL (ref 1.5–4.5)
Glucose: 89 mg/dL (ref 70–99)
Potassium: 5.1 mmol/L (ref 3.5–5.2)
Sodium: 140 mmol/L (ref 134–144)
Total Protein: 6.5 g/dL (ref 6.0–8.5)
eGFR: 68 mL/min/{1.73_m2} (ref >59)

## 2023-08-30 LAB — VITAMIN D 25 HYDROXY (VIT D DEFICIENCY, FRACTURES): Vit D, 25-Hydroxy: 56.2 ng/mL (ref 30.0–100.0)

## 2023-08-30 LAB — LIPID PANEL
Chol/HDL Ratio: 3.2 ratio (ref 0.0–4.4)
Cholesterol, Total: 177 mg/dL (ref 100–199)
HDL: 55 mg/dL (ref >39)
LDL Chol Calc (NIH): 95 mg/dL (ref 0–99)
Triglycerides: 159 mg/dL — ABNORMAL HIGH (ref 0–149)
VLDL Cholesterol Cal: 27 mg/dL (ref 5–40)

## 2023-08-30 LAB — TSH: TSH: 1.79 u[IU]/mL (ref 0.450–4.500)

## 2023-09-13 DIAGNOSIS — G8929 Other chronic pain: Secondary | ICD-10-CM | POA: Diagnosis not present

## 2023-09-13 DIAGNOSIS — M25561 Pain in right knee: Secondary | ICD-10-CM | POA: Diagnosis not present

## 2023-09-13 DIAGNOSIS — M1711 Unilateral primary osteoarthritis, right knee: Secondary | ICD-10-CM | POA: Diagnosis not present

## 2023-09-15 NOTE — Progress Notes (Incomplete)
[facility]  [address] [place],  [place]  [address] [phone]  Clinic Day:  03/10/23  Referring physician: Crist Fat, MD  ASSESSMENT & PLAN:   Thrombocytopenia, mild, and currently resolved. She has been running between 130-140,000. Evaluation was negative. If her counts start to fall again, we can consider bone marrow evaluation, but her blood count abnormalities are very mild at this time.  Leukopenia with neutropenia, improved. This has been fluctuating up and down. This could represent cyclic neutropenia, but we wouldn't expect thrombocytopenia with that.  There was no evidence of nutritional deficiency or multiple myeloma, so this is likely to be myelodysplasia, which would be diagnosed with a bone marrow.  Chronic back pain secondary to fractures of the vertebrae after a fall in December 2021. For completeness, we will obtain a bone density scan as it has been many years since her last imaging. She is not currently on oral calcium or vitamin D.   Strong family history of malignancy. She has undergone genetic testing with BRCA and Myriad Myrisk panel in July 2021 which was negative.  5.   Poor appetite and weight loss. She is currently on trazodone 75 mg at bedtime and drinking 3 ensures daily. She has gained 3-1/2 pounds since her last visit.  Plan Her CBC remained normal at her last visit on 09/09/2022. Her labs today are pending and I will add a TSH to that and call her back with those results. I will see her back in 6 months with CBC and CMP.  She understands and agrees with this plan of care. I have answered her questions and she knows to call with any concerns.  I provided 20 minutes of face-to-face time during this this encounter and > 50% was spent counseling as documented under my assessment and plan.    Dellia Beckwith, MD [facility] [address]  [place] [address] Dept: [phone] Dept Fax: [phone]    CHIEF COMPLAINT:  CC: Mild thrombocytopenia and mild leukopenia  Current Treatment:  Surveillance  HISTORY OF PRESENT ILLNESS:  Rebecca [name] is a 77 y.o. female referred by Dr. Michel Santee Eyk for the evaluation and treatment of mild thrombocytopenia and leukopenia. She was initially found to have thrombocytopenia back in July 2022 with a platelet count of 137,000. By October her platelet count was 141,000, white count was 3.3 with an ANC of 1600, and hemoglobin was normal at 12.6. Chemistries were unremarkable. She underwent GI evaluation with Dr. Chales Abrahams in December, and gastric polyps were seen and biopsies were benign. She also has atrophic gastritis and has been placed on Carafate. Labs at that time showed a white count of 3.6 with an ANC of 2200, platelets were 137,000, and hemoglobin was normal at 13.0.  She states that her white count has been low in the past. She does have a significant family history of malignancy, and states that she did undergo genetic testing a few years ago which was negative. She has had 95% of her breast tissue removed and has had bilateral reconstructions. She underwent partial hysterectomy in 1984 due to menorrhagia, and was on hormone replacement for a short time.  I have reviewed her chart and materials related to her blood extensively and collaborated history with the patient. Oncology History   No history exists.   INTERVAL HISTORY:  Rebecca [name] is here for routine follow up for mild thrombocytopenia and mild  leukopenia. Patient states that she feels *** and ***.     She denies signs of infection such as sore throat, sinus drainage, cough, or urinary symptoms.  She denies fevers or recurrent chills. She denies pain. She denies nausea, vomiting, chest pain, dyspnea or cough. Her appetite is *** and her weight {Weight change:10426}.  Patient states that she feels well and has no complaints of  pain but wants some back/shoulder support. She does have a brace but doesn't use it often, as it is not fitted properly for someone of her short stature. Her CBC remained normal at her last visit on 09/09/2022. Her labs today are pending and I will add a TSH to that and call her back with those results. I will see her back in 6 months with CBC and CMP.  She denies signs of infection such as sore throat, sinus drainage, cough, or urinary symptoms.  She denies fevers or recurrent chills. She denies pain. She denies nausea, vomiting, chest pain, dyspnea or cough. Her appetite is alright and her weight has increased 3 pounds over last 1.5 weeks . She is actively trying to gain weight and drinks about 3 boosts a day. This patient is accompanied in the office by her  daughter  .  HISTORY:  Allergies:  Allergies  Allergen Reactions   Ciprofloxacin Other (See Comments)   Zonisamide Other (See Comments)    Unsure about this medication    Codeine Nausea Only and Other (See Comments)   Nitrofurantoin Nausea Only   Other Nausea Only    Current Medications: Current Outpatient Medications  Medication Sig Dispense Refill   Cyclobenzaprine HCl (FLEXERIL PO) Take 1 tablet by mouth as needed.     dexlansoprazole (DEXILANT) 60 MG capsule Take 1 capsule (60 mg total) by mouth daily. 90 capsule 4   Galcanezumab-gnlm (EMGALITY [place]) Inject into the skin every 30 (thirty) days. Pt unsure of dosage     metoprolol tartrate (LOPRESSOR) 25 MG tablet Take 3 tablets (75 mg total) by mouth 2 (two) times daily. 540 tablet 3   mupirocin cream (BACTROBAN) 2 % Apply 1 Application topically 2 (two) times daily. 15 g 0   pravastatin (PRAVACHOL) 40 MG tablet Take 1 tablet (40 mg total) by mouth at bedtime. 90 tablet 3   traZODone (DESYREL) 50 MG tablet Take 1.5 tablets (75 mg total) by mouth at bedtime. 100 tablet 1   Current Facility-Administered Medications  Medication Dose Route Frequency Provider Last Rate Last Admin    0.9 %  sodium chloride infusion  500 mL Intravenous Once Lynann Bologna, MD        REVIEW OF SYSTEMS:  Review of Systems  Constitutional:  Positive for fatigue. Negative for appetite change (poor), chills, diaphoresis, fever and unexpected weight change.  HENT:  Negative.  Negative for hearing loss, lump/mass, mouth sores, nosebleeds, sore throat, tinnitus, trouble swallowing and voice change.   Eyes: Negative.  Negative for eye problems and icterus.  Respiratory: Negative.  Negative for chest tightness, cough, hemoptysis, shortness of breath and wheezing.   Cardiovascular: Negative.  Negative for chest pain, leg swelling and palpitations.  Gastrointestinal: Negative.  Negative for abdominal distention, abdominal pain, blood in stool, constipation, diarrhea, nausea, rectal pain and vomiting.  Endocrine: Negative.   Genitourinary: Negative.  Negative for bladder incontinence, difficulty urinating, dyspareunia, dysuria, frequency, hematuria, menstrual problem, nocturia, pelvic pain, vaginal bleeding and vaginal discharge.   Musculoskeletal: Negative.  Negative for arthralgias, back pain, flank pain, gait problem, myalgias, neck  pain and neck stiffness.  Skin: Negative.  Negative for itching, rash and wound.  Neurological: Negative.  Negative for dizziness, extremity weakness, gait problem, headaches, light-headedness, numbness, seizures and speech difficulty.  Hematological:  Negative for adenopathy. Bruises/bleeds easily (Bruises only).  Psychiatric/Behavioral: Negative.  Negative for confusion, decreased concentration, depression, sleep disturbance and suicidal ideas. The patient is not nervous/anxious.     VITALS:  There were no vitals taken for this visit.  Wt Readings from Last 3 Encounters:  08/29/23 126 lb (57.2 kg)  08/15/23 125 lb (56.7 kg)  08/02/23 126 lb (57.2 kg)    There is no height or weight on file to calculate BMI.  Performance status (ECOG): 1 - Symptomatic but completely  ambulatory  PHYSICAL EXAM:  Physical Exam Vitals and nursing note reviewed. Exam conducted with a chaperone present.  Constitutional:      General: She is not in acute distress.    Appearance: Normal appearance. She is normal weight. She is not ill-appearing, toxic-appearing or diaphoretic.  HENT:     Head: Normocephalic and atraumatic.     Right Ear: Tympanic membrane, ear canal and external ear normal. There is no impacted cerumen.     Left Ear: Tympanic membrane, ear canal and external ear normal. There is no impacted cerumen.     Nose: Nose normal. No congestion or rhinorrhea.     Mouth/Throat:     Mouth: Mucous membranes are moist.     Pharynx: Oropharynx is clear. No oropharyngeal exudate or posterior oropharyngeal erythema.  Eyes:     General: No scleral icterus.       Right eye: No discharge.        Left eye: No discharge.     Extraocular Movements: Extraocular movements intact.     Conjunctiva/sclera: Conjunctivae normal.     Pupils: Pupils are equal, round, and reactive to light.  Neck:     Vascular: No carotid bruit.  Cardiovascular:     Rate and Rhythm: Normal rate and regular rhythm.     Pulses: Normal pulses.     Heart sounds: Normal heart sounds. No murmur heard.    No friction rub. No gallop.  Pulmonary:     Effort: Pulmonary effort is normal. No respiratory distress.     Breath sounds: Normal breath sounds. No stridor. No wheezing, rhonchi or rales.  Chest:     Chest wall: No tenderness.  Abdominal:     General: Bowel sounds are normal. There is no distension.     Palpations: Abdomen is soft. There is no mass.     Tenderness: There is no abdominal tenderness. There is no right CVA tenderness, left CVA tenderness, guarding or rebound.     Hernia: No hernia is present.  Musculoskeletal:        General: No swelling, tenderness, deformity or signs of injury. Normal range of motion.     Cervical back: Normal range of motion. No rigidity or tenderness.     Right  lower leg: No edema.     Left lower leg: No edema.     Left ankle: Ecchymosis present.     Comments: Fairly large bruise on the back of her left ankle.  She does have Kyphosis  Lymphadenopathy:     Cervical: No cervical adenopathy.  Skin:    General: Skin is warm and dry.     Coloration: Skin is not jaundiced or pale.     Findings: No bruising, erythema, lesion or rash.  Neurological:  General: No focal deficit present.     Mental Status: She is alert and oriented to person, place, and time. Mental status is at baseline.     Cranial Nerves: No cranial nerve deficit.     Sensory: No sensory deficit.     Motor: No weakness.     Coordination: Coordination normal.     Gait: Gait normal.     Deep Tendon Reflexes: Reflexes normal.  Psychiatric:        Mood and Affect: Mood normal.        Behavior: Behavior normal.        Thought Content: Thought content normal.        Judgment: Judgment normal.     LABS:      Latest Ref Rng & Units 08/29/2023   11:39 AM 03/10/2023    9:35 AM 12/23/2022   11:39 AM  CBC  WBC 3.4 - 10.8 x10E3/uL 4.5  3.5  4.1   Hemoglobin 11.1 - 15.9 g/dL 29.5  18.8  41.6   Hematocrit 34.0 - 46.6 % 41.0  40.8  39.7   Platelets 150 - 450 x10E3/uL 138  138  150.0       Latest Ref Rng & Units 08/29/2023   11:39 AM 03/17/2023   10:40 AM 03/10/2023    9:35 AM  CMP  Glucose 70 - 99 mg/dL 89  84  63   BUN 8 - 27 mg/dL 17  19  20    Creatinine 0.57 - 1.00 mg/dL 6.06  3.01  6.01   Sodium 134 - 144 mmol/L 140  142  140   Potassium 3.5 - 5.2 mmol/L 5.1  4.4  4.0   Chloride 96 - 106 mmol/L 105  105  106   CO2 20 - 29 mmol/L 24  25  27    Calcium 8.7 - 10.3 mg/dL 9.6  9.2  9.5   Total Protein 6.0 - 8.5 g/dL 6.5  6.2  6.8   Total Bilirubin 0.0 - 1.2 mg/dL 0.5  0.3  0.6   Alkaline Phos 44 - 121 IU/L 67  62  53   AST 0 - 40 IU/L 29  26  27    ALT 0 - 32 IU/L 25  23  26      Lab Results  Component Value Date   TOTALPROTELP 6.4 12/16/2021   ALBUMINELP 3.9 12/16/2021    A1GS 0.2 12/16/2021   A2GS 0.7 12/16/2021   BETS 0.8 12/16/2021   GAMS 0.7 12/16/2021   MSPIKE Not Observed 12/16/2021   SPEI Comment 12/16/2021   Lab Results  Component Value Date   FERRITIN 56.2 11/03/2018   IRONPCTSAT 30.1 11/03/2018   Component Ref Range & Units 08/29/2023  TSH 0.450 - 4.500 uIU/mL 3.228 R, CM     Component Ref Range & Units 2 wk ago  Cholesterol, Total 100 - 199 mg/dL 093  Triglycerides 0 - 149 mg/dL 235 High   HDL >57 mg/dL 55  VLDL Cholesterol Cal 5 - 40 mg/dL 27  LDL Chol Calc (NIH) 0 - 99 mg/dL 95  Chol/HDL Ratio 0.0 - 4.4 ratio 3.2        Component Ref Range & Units 2 wk ago  Vit D, 25-Hydroxy 30.0 - 100.0 ng/mL 56.2     No results found for: "LDH"   STUDIES:  No results found.  EXAM: 07/06/2023 DIGITAL SCREENING BILATERAL MAMMOGRAM WITH IMPLANTS, CAD AND TOMOSYNTHESIS IMPRESSION: No mammographic evidence of malignancy. A result letter of this screening mammogram  will be mailed directly to the patient.  EXAM: 01/11/2023 CT ABDOMEN AND PELVIS WITH CONTRAST IMPRESSION: No acute findings within the abdomen or pelvis. Tiny left renal calculi. No evidence of ureteral calculi or hydronephrosis. 3. Aortic Atherosclerosis (ICD10-I70.0).     I,Jasmine M Lassiter,acting as a scribe for Dellia Beckwith, MD.,have documented all relevant documentation on the behalf of Dellia Beckwith, MD,as directed by  Dellia Beckwith, MD while in the presence of Dellia Beckwith, MD.

## 2023-09-19 DIAGNOSIS — N200 Calculus of kidney: Secondary | ICD-10-CM | POA: Diagnosis not present

## 2023-09-19 DIAGNOSIS — R112 Nausea with vomiting, unspecified: Secondary | ICD-10-CM | POA: Diagnosis not present

## 2023-09-19 DIAGNOSIS — K859 Acute pancreatitis without necrosis or infection, unspecified: Secondary | ICD-10-CM | POA: Diagnosis not present

## 2023-09-28 ENCOUNTER — Encounter: Payer: Self-pay | Admitting: Internal Medicine

## 2023-09-28 ENCOUNTER — Ambulatory Visit: Payer: Medicare Other | Admitting: Internal Medicine

## 2023-09-28 VITALS — BP 124/70 | HR 65 | Temp 98.0°F | Resp 18 | Ht 61.0 in | Wt 124.0 lb

## 2023-09-28 DIAGNOSIS — K8500 Idiopathic acute pancreatitis without necrosis or infection: Secondary | ICD-10-CM | POA: Insufficient documentation

## 2023-09-28 NOTE — Progress Notes (Unsigned)
Office Visit  Subjective   Patient ID: Rebecca [name]   DOB: Oct 05, 1946   Age: 77 y.o.   MRN: [id number]   Chief Complaint Chief Complaint  Patient presents with   Follow-up    ER follow up      History of Present Illness Rebecca [name] is a 77 yo female who comes in today for a ER followup.  She was seen at the [facility] ER on 09/19/2023 with abdominal pain associated with vomiting.  She was afebrile and her WBC was normal.  They did do a CT urogram on 09/19/2023 and this showed no acute urinary problems but there was some non-obstructing punctate renal calculi in both kidneys.  Her liver and pancreas was normal and her gallbladder was surgically absent.  On labs, she was noted to have a normal liver enzymes and ALP but her lipase level was elevated at 857.  Her BUN was minimally was elevated.  They felt she had acute pancreatitis and discharged her from the ER with oxycodone and zofran.  They asked her to drink plenty of fluids with clear liquid diet for the next few days.  Her pain resolved that day.  She does not drink alcohol.  She denies any recent viral infections.     Past Medical History Past Medical History:  Diagnosis Date   Anxiety disorder    Aortic atherosclerosis (HCC)    Complication of anesthesia 2006   Drop in  Bp intra-op with Lap Chole. [employer].   Depression    Dysfunctional gallbladder    GERD (gastroesophageal reflux disease)    History of colon polyps    IBS (irritable bowel syndrome)    Internal hemorrhoids without complication    Migraine      Allergies Allergies  Allergen Reactions   Ciprofloxacin Other (See Comments)   Zonisamide Other (See Comments)    Unsure about this medication    Codeine Nausea Only and Other (See Comments)   Nitrofurantoin Nausea Only   Other Nausea Only     Medications  Current Outpatient Medications:    dexlansoprazole (DEXILANT) 60 MG capsule, Take 1 capsule (60 mg total) by mouth daily., Disp: 90 capsule, Rfl: 4    Galcanezumab-gnlm (EMGALITY [place]), Inject into the skin every 30 (thirty) days. Pt unsure of dosage, Disp: , Rfl:    metoprolol tartrate (LOPRESSOR) 25 MG tablet, Take 3 tablets (75 mg total) by mouth 2 (two) times daily., Disp: 540 tablet, Rfl: 3   mupirocin cream (BACTROBAN) 2 %, Apply 1 Application topically 2 (two) times daily., Disp: 15 g, Rfl: 0   pravastatin (PRAVACHOL) 40 MG tablet, Take 1 tablet (40 mg total) by mouth at bedtime., Disp: 90 tablet, Rfl: 3   traZODone (DESYREL) 50 MG tablet, Take 1.5 tablets (75 mg total) by mouth at bedtime., Disp: 100 tablet, Rfl: 1   Review of Systems Review of Systems  Constitutional:  Negative for chills, fever and malaise/fatigue.  Respiratory:  Negative for shortness of breath.   Cardiovascular:  Negative for chest pain, palpitations and leg swelling.  Gastrointestinal:  Negative for abdominal pain, blood in stool, constipation, diarrhea, melena, nausea and vomiting.  Musculoskeletal:  Negative for myalgias.  Skin:  Negative for itching and rash.  Neurological:  Negative for dizziness, focal weakness and headaches.       Objective:    Vitals BP 124/70 (BP Location: Left Arm, Patient Position: Sitting, Cuff Size: Normal)   Pulse 65   Temp 98 F (36.7 C)  Resp 18   Ht 5\' 1"  (1.549 m)   Wt 124 lb (56.2 kg)   SpO2 95%   BMI 23.43 kg/m    Physical Examination Physical Exam Constitutional:      Appearance: Normal appearance. She is not ill-appearing.  Cardiovascular:     Rate and Rhythm: Normal rate and regular rhythm.     Pulses: Normal pulses.     Heart sounds: No murmur heard.    No friction rub. No gallop.  Pulmonary:     Effort: Pulmonary effort is normal. No respiratory distress.     Breath sounds: No wheezing, rhonchi or rales.  Abdominal:     General: Bowel sounds are normal. There is no distension.     Palpations: Abdomen is soft.     Tenderness: There is no abdominal tenderness.  Musculoskeletal:     Right lower leg:  No edema.     Left lower leg: No edema.  Skin:    General: Skin is warm and dry.     Findings: No rash.  Neurological:     Mental Status: She is alert.        Assessment & Plan:   Idiopathic acute pancreatitis I reviewed her CT scan and labs and notes from [facility].  I do not see a cause of her acute pancreatitis but this is now resolved.  I reviewed her medications to see if one of these could induce it.  I did not see where dexilant had a side effect of pancreatitis.  She does not drink.  If she has a recurrence of her abdominal pain, she is to go back into the ER.    No follow-ups on file.   Crist Fat, MD

## 2023-09-28 NOTE — Assessment & Plan Note (Signed)
I reviewed her CT scan and labs and notes from [facility].  I do not see a cause of her acute pancreatitis but this is now resolved.  I reviewed her medications to see if one of these could induce it.  I did not see where dexilant had a side effect of pancreatitis.  She does not drink.  If she has a recurrence of her abdominal pain, she is to go back into the ER.

## 2023-09-29 ENCOUNTER — Ambulatory Visit: Payer: Medicare Other | Admitting: Oncology

## 2023-09-29 ENCOUNTER — Other Ambulatory Visit: Payer: Medicare Other

## 2023-11-03 NOTE — Progress Notes (Unsigned)
[facility]  [address] [place],  [place]  [address] [phone]  Clinic Day:11/04/23  Referring physician: Crist Fat, MD  ASSESSMENT & PLAN:   Thrombocytopenia, mild, and currently resolved. She has been running between 130-140,000. Evaluation was negative. If her counts start to fall again, we can consider bone marrow evaluation, but her blood count abnormalities are very mild at this time.  Leukopenia with neutropenia, improved. This has been fluctuating up and down. This could represent cyclic neutropenia, but we wouldn't expect thrombocytopenia with that.  There was no evidence of nutritional deficiency or multiple myeloma, so this is likely to be myelodysplasia, which would be diagnosed with a bone marrow.  Chronic back pain secondary to fractures of the vertebrae after a fall in December 2021. For completeness, we will obtain a bone density scan as it has been many years since her last imaging. She is not currently on oral calcium or vitamin D.   Strong family history of malignancy. She has undergone genetic testing with BRCA and Myriad Myrisk panel in July 2021 which was negative.  5.   Poor appetite and weight loss. She is currently on trazodone 75 mg at bedtime and drinking 3 ensures daily. She has gained 3-1/2 pounds since her last visit.  Plan Her CBC remained normal at her last visit on 09/09/2022. Her labs today are pending and I will add a TSH to that and call her back with those results. I will see her back in 6 months with CBC and CMP.  She understands and agrees with this plan of care. I have answered her questions and she knows to call with any concerns.  I provided 20 minutes of face-to-face time during this this encounter and > 50% was spent counseling as documented under my assessment and plan.    Dellia Beckwith, MD [facility] [address]  [place] [address] Dept: [phone] Dept Fax: [phone]    CHIEF COMPLAINT:  CC: Mild thrombocytopenia and mild leukopenia  Current Treatment:  Surveillance   HISTORY OF PRESENT ILLNESS:  Rebecca [name] is a 77 y.o. female referred by Dr. Michel Santee Eyk for the evaluation and treatment of mild thrombocytopenia and leukopenia. She was initially found to have thrombocytopenia back in July 2022 with a platelet count of 137,000. By October her platelet count was 141,000, white count was 3.3 with an ANC of 1600, and hemoglobin was normal at 12.6. Chemistries were unremarkable. She underwent GI evaluation with Dr. Chales Abrahams in December, and gastric polyps were seen and biopsies were benign. She also has atrophic gastritis and has been placed on Carafate. Labs at that time showed a white count of 3.6 with an ANC of 2200, platelets were 137,000, and hemoglobin was normal at 13.0.  She states that her white count has been low in the past. She does have a significant family history of malignancy, and states that she did undergo genetic testing a few years ago which was negative. She has had 95% of her breast tissue removed and has had bilateral reconstructions. She underwent partial hysterectomy in 1984 due to menorrhagia, and was on hormone replacement for a short time.  I have reviewed her chart and materials related to her blood extensively and collaborated history with the patient. Oncology History   No history exists.   INTERVAL HISTORY:  Rebecca [name] is here for routine follow up for mild thrombocytopenia and mild leukopenia.  Patient states that she feels well and has no complaints of pain but wants some back/shoulder support. She does have a brace but doesn't use it often, as it is not fitted properly for someone of her short stature. Her CBC remained normal at her last visit on 09/09/2022. Her labs today are pending and I will add a TSH to that and call her back with those results. I will see her  back in 6 months with CBC and CMP.  She denies signs of infection such as sore throat, sinus drainage, cough, or urinary symptoms.  She denies fevers or recurrent chills. She denies pain. She denies nausea, vomiting, chest pain, dyspnea or cough. Her appetite is alright and her weight has increased 3 pounds over last 1.5 weeks . She is actively trying to gain weight and drinks about 3 boosts a day. This patient is accompanied in the office by her  daughter  .  HISTORY:  Allergies:  Allergies  Allergen Reactions   Ciprofloxacin Other (See Comments)   Zonisamide Other (See Comments)    Unsure about this medication    Codeine Nausea Only and Other (See Comments)   Nitrofurantoin Nausea Only   Other Nausea Only    Current Medications: Current Outpatient Medications  Medication Sig Dispense Refill   dexlansoprazole (DEXILANT) 60 MG capsule Take 1 capsule (60 mg total) by mouth daily. 90 capsule 4   Galcanezumab-gnlm (EMGALITY [place]) Inject into the skin every 30 (thirty) days. Pt unsure of dosage     metoprolol tartrate (LOPRESSOR) 25 MG tablet Take 3 tablets (75 mg total) by mouth 2 (two) times daily. 540 tablet 3   mupirocin cream (BACTROBAN) 2 % Apply 1 Application topically 2 (two) times daily. 15 g 0   pravastatin (PRAVACHOL) 40 MG tablet Take 1 tablet (40 mg total) by mouth at bedtime. 90 tablet 3   traZODone (DESYREL) 50 MG tablet Take 1.5 tablets (75 mg total) by mouth at bedtime. 100 tablet 1   No current facility-administered medications for this visit.    REVIEW OF SYSTEMS:  Review of Systems  Constitutional:  Positive for fatigue. Negative for appetite change (poor), chills, diaphoresis, fever and unexpected weight change.  HENT:  Negative.  Negative for hearing loss, lump/mass, mouth sores, nosebleeds, sore throat, tinnitus, trouble swallowing and voice change.   Eyes: Negative.  Negative for eye problems and icterus.  Respiratory: Negative.  Negative for chest tightness,  cough, hemoptysis, shortness of breath and wheezing.   Cardiovascular: Negative.  Negative for chest pain, leg swelling and palpitations.  Gastrointestinal: Negative.  Negative for abdominal distention, abdominal pain, blood in stool, constipation, diarrhea, nausea, rectal pain and vomiting.  Endocrine: Negative.   Genitourinary: Negative.  Negative for bladder incontinence, difficulty urinating, dyspareunia, dysuria, frequency, hematuria, menstrual problem, nocturia, pelvic pain, vaginal bleeding and vaginal discharge.   Musculoskeletal: Negative.  Negative for arthralgias, back pain, flank pain, gait problem, myalgias, neck pain and neck stiffness.  Skin: Negative.  Negative for itching, rash and wound.  Neurological: Negative.  Negative for dizziness, extremity weakness, gait problem, headaches, light-headedness, numbness, seizures and speech difficulty.  Hematological:  Negative for adenopathy. Bruises/bleeds easily (Bruises only).  Psychiatric/Behavioral: Negative.  Negative for confusion, decreased concentration, depression, sleep disturbance and suicidal ideas. The patient is not nervous/anxious.     VITALS:  There were no vitals taken for this visit.  Wt Readings from Last 3 Encounters:  09/28/23 124 lb (56.2 kg)  08/29/23 126 lb (57.2 kg)  08/15/23 125  lb (56.7 kg)    There is no height or weight on file to calculate BMI.  Performance status (ECOG): 1 - Symptomatic but completely ambulatory  PHYSICAL EXAM:  Physical Exam Vitals and nursing note reviewed. Exam conducted with a chaperone present.  Constitutional:      General: She is not in acute distress.    Appearance: Normal appearance. She is normal weight. She is not ill-appearing, toxic-appearing or diaphoretic.  HENT:     Head: Normocephalic and atraumatic.     Right Ear: Tympanic membrane, ear canal and external ear normal. There is no impacted cerumen.     Left Ear: Tympanic membrane, ear canal and external ear normal.  There is no impacted cerumen.     Nose: Nose normal. No congestion or rhinorrhea.     Mouth/Throat:     Mouth: Mucous membranes are moist.     Pharynx: Oropharynx is clear. No oropharyngeal exudate or posterior oropharyngeal erythema.  Eyes:     General: No scleral icterus.       Right eye: No discharge.        Left eye: No discharge.     Extraocular Movements: Extraocular movements intact.     Conjunctiva/sclera: Conjunctivae normal.     Pupils: Pupils are equal, round, and reactive to light.  Neck:     Vascular: No carotid bruit.  Cardiovascular:     Rate and Rhythm: Normal rate and regular rhythm.     Pulses: Normal pulses.     Heart sounds: Normal heart sounds. No murmur heard.    No friction rub. No gallop.  Pulmonary:     Effort: Pulmonary effort is normal. No respiratory distress.     Breath sounds: Normal breath sounds. No stridor. No wheezing, rhonchi or rales.  Chest:     Chest wall: No tenderness.  Abdominal:     General: Bowel sounds are normal. There is no distension.     Palpations: Abdomen is soft. There is no mass.     Tenderness: There is no abdominal tenderness. There is no right CVA tenderness, left CVA tenderness, guarding or rebound.     Hernia: No hernia is present.  Musculoskeletal:        General: No swelling, tenderness, deformity or signs of injury. Normal range of motion.     Cervical back: Normal range of motion. No rigidity or tenderness.     Right lower leg: No edema.     Left lower leg: No edema.     Left ankle: Ecchymosis present.     Comments: Fairly large bruise on the back of her left ankle.  She does have Kyphosis  Lymphadenopathy:     Cervical: No cervical adenopathy.  Skin:    General: Skin is warm and dry.     Coloration: Skin is not jaundiced or pale.     Findings: No bruising, erythema, lesion or rash.  Neurological:     General: No focal deficit present.     Mental Status: She is alert and oriented to person, place, and time.  Mental status is at baseline.     Cranial Nerves: No cranial nerve deficit.     Sensory: No sensory deficit.     Motor: No weakness.     Coordination: Coordination normal.     Gait: Gait normal.     Deep Tendon Reflexes: Reflexes normal.  Psychiatric:        Mood and Affect: Mood normal.  Behavior: Behavior normal.        Thought Content: Thought content normal.        Judgment: Judgment normal.     LABS:      Latest Ref Rng & Units 08/29/2023   11:39 AM 03/10/2023    9:35 AM 12/23/2022   11:39 AM  CBC  WBC 3.4 - 10.8 x10E3/uL 4.5  3.5  4.1   Hemoglobin 11.1 - 15.9 g/dL 09.8  11.9  14.7   Hematocrit 34.0 - 46.6 % 41.0  40.8  39.7   Platelets 150 - 450 x10E3/uL 138  138  150.0       Latest Ref Rng & Units 08/29/2023   11:39 AM 03/17/2023   10:40 AM 03/10/2023    9:35 AM  CMP  Glucose 70 - 99 mg/dL 89  84  63   BUN 8 - 27 mg/dL 17  19  20    Creatinine 0.57 - 1.00 mg/dL 8.29  5.62  1.30   Sodium 134 - 144 mmol/L 140  142  140   Potassium 3.5 - 5.2 mmol/L 5.1  4.4  4.0   Chloride 96 - 106 mmol/L 105  105  106   CO2 20 - 29 mmol/L 24  25  27    Calcium 8.7 - 10.3 mg/dL 9.6  9.2  9.5   Total Protein 6.0 - 8.5 g/dL 6.5  6.2  6.8   Total Bilirubin 0.0 - 1.2 mg/dL 0.5  0.3  0.6   Alkaline Phos 44 - 121 IU/L 67  62  53   AST 0 - 40 IU/L 29  26  27    ALT 0 - 32 IU/L 25  23  26      Lab Results  Component Value Date   TOTALPROTELP 6.4 12/16/2021   ALBUMINELP 3.9 12/16/2021   A1GS 0.2 12/16/2021   A2GS 0.7 12/16/2021   BETS 0.8 12/16/2021   GAMS 0.7 12/16/2021   MSPIKE Not Observed 12/16/2021   SPEI Comment 12/16/2021   Lab Results  Component Value Date   FERRITIN 56.2 11/03/2018   IRONPCTSAT 30.1 11/03/2018   No results found for: "LDH"   STUDIES:  No results found.  EXAM: 01/11/2023 CT ABDOMEN AND PELVIS WITH CONTRAST IMPRESSION: No acute findings within the abdomen or pelvis. Tiny left renal calculi. No evidence of ureteral calculi or hydronephrosis. 3.  Aortic Atherosclerosis (ICD10-I70.0).     I,Jasmine M Lassiter,acting as a scribe for Dellia Beckwith, MD.,have documented all relevant documentation on the behalf of Dellia Beckwith, MD,as directed by  Dellia Beckwith, MD while in the presence of Dellia Beckwith, MD.

## 2023-11-04 ENCOUNTER — Inpatient Hospital Stay (HOSPITAL_BASED_OUTPATIENT_CLINIC_OR_DEPARTMENT_OTHER): Payer: Medicare Other | Admitting: Oncology

## 2023-11-04 ENCOUNTER — Other Ambulatory Visit: Payer: Self-pay | Admitting: Internal Medicine

## 2023-11-04 ENCOUNTER — Inpatient Hospital Stay: Payer: Medicare Other | Attending: Oncology

## 2023-11-04 VITALS — BP 123/59 | HR 60 | Temp 97.9°F | Resp 16 | Ht 61.0 in | Wt 122.9 lb

## 2023-11-04 DIAGNOSIS — D696 Thrombocytopenia, unspecified: Secondary | ICD-10-CM

## 2023-11-04 DIAGNOSIS — D709 Neutropenia, unspecified: Secondary | ICD-10-CM | POA: Diagnosis not present

## 2023-11-04 DIAGNOSIS — D72819 Decreased white blood cell count, unspecified: Secondary | ICD-10-CM

## 2023-11-04 DIAGNOSIS — D704 Cyclic neutropenia: Secondary | ICD-10-CM | POA: Diagnosis not present

## 2023-11-04 LAB — CBC WITH DIFFERENTIAL (CANCER CENTER ONLY)
Abs Immature Granulocytes: 0.02 10*3/uL (ref 0.00–0.07)
Basophils Absolute: 0 10*3/uL (ref 0.0–0.1)
Basophils Relative: 0 %
Eosinophils Absolute: 0.4 10*3/uL (ref 0.0–0.5)
Eosinophils Relative: 5 %
HCT: 39 % (ref 36.0–46.0)
Hemoglobin: 12.7 g/dL (ref 12.0–15.0)
Immature Granulocytes: 0 %
Immature Platelet Fraction: 2.1 % (ref 1.2–8.6)
Lymphocytes Relative: 14 %
Lymphs Abs: 0.9 10*3/uL (ref 0.7–4.0)
MCH: 31.7 pg (ref 26.0–34.0)
MCHC: 32.6 g/dL (ref 30.0–36.0)
MCV: 97.3 fL (ref 80.0–100.0)
Monocytes Absolute: 0.5 10*3/uL (ref 0.1–1.0)
Monocytes Relative: 7 %
Neutro Abs: 4.8 10*3/uL (ref 1.7–7.7)
Neutrophils Relative %: 74 %
Platelet Count: 125 10*3/uL — ABNORMAL LOW (ref 150–400)
RBC: 4.01 MIL/uL (ref 3.87–5.11)
RDW: 12.3 % (ref 11.5–15.5)
WBC Count: 6.6 10*3/uL (ref 4.0–10.5)
nRBC: 0 % (ref 0.0–0.2)
nRBC: 0 /100{WBCs}

## 2023-11-04 LAB — CMP (CANCER CENTER ONLY)
ALT: 28 U/L (ref 0–44)
AST: 30 U/L (ref 15–41)
Albumin: 4.4 g/dL (ref 3.5–5.0)
Alkaline Phosphatase: 65 U/L (ref 38–126)
Anion gap: 9 (ref 5–15)
BUN: 18 mg/dL (ref 8–23)
CO2: 26 mmol/L (ref 22–32)
Calcium: 9.4 mg/dL (ref 8.9–10.3)
Chloride: 106 mmol/L (ref 98–111)
Creatinine: 1 mg/dL (ref 0.44–1.00)
GFR, Estimated: 59 mL/min — ABNORMAL LOW (ref >60)
Glucose, Bld: 92 mg/dL (ref 70–99)
Potassium: 4.4 mmol/L (ref 3.5–5.1)
Sodium: 141 mmol/L (ref 135–145)
Total Bilirubin: 0.6 mg/dL (ref <1.2)
Total Protein: 6.4 g/dL — ABNORMAL LOW (ref 6.5–8.1)

## 2023-11-04 MED ORDER — TRAZODONE HCL 50 MG PO TABS: 75.0000 mg | ORAL_TABLET | Freq: Every day | ORAL | 1 refills | Status: DC

## 2023-11-10 ENCOUNTER — Encounter: Payer: Self-pay | Admitting: Oncology

## 2023-11-15 ENCOUNTER — Other Ambulatory Visit: Payer: Self-pay

## 2023-11-15 MED ORDER — TRAZODONE HCL 50 MG PO TABS: 75.0000 mg | ORAL_TABLET | Freq: Every day | ORAL | 0 refills | Status: DC

## 2023-11-30 ENCOUNTER — Ambulatory Visit: Payer: Medicare Other | Admitting: Internal Medicine

## 2023-12-02 ENCOUNTER — Encounter: Payer: Self-pay | Admitting: Internal Medicine

## 2023-12-02 ENCOUNTER — Other Ambulatory Visit: Payer: Self-pay

## 2023-12-02 ENCOUNTER — Ambulatory Visit: Payer: Medicare Other | Admitting: Internal Medicine

## 2023-12-02 VITALS — BP 122/74 | HR 62 | Temp 98.4°F | Resp 18 | Ht 61.0 in | Wt 123.6 lb

## 2023-12-02 DIAGNOSIS — I10 Essential (primary) hypertension: Secondary | ICD-10-CM

## 2023-12-02 DIAGNOSIS — N390 Urinary tract infection, site not specified: Secondary | ICD-10-CM | POA: Insufficient documentation

## 2023-12-02 MED ORDER — AMOXICILLIN-POT CLAVULANATE 875-125 MG PO TABS: 1.0000 | ORAL_TABLET | Freq: Two times a day (BID) | ORAL | 0 refills | Status: AC

## 2023-12-02 MED ORDER — TRAZODONE HCL 50 MG PO TABS: 75.0000 mg | ORAL_TABLET | Freq: Every day | ORAL | 0 refills | Status: DC

## 2023-12-02 NOTE — Progress Notes (Signed)
Office Visit  Subjective   Patient ID: Rebecca [name]   DOB: July 28, 1946   Age: 78 y.o.   MRN: [id number]   Chief Complaint Chief Complaint  Patient presents with   Follow-up     History of Present Illness The patient is a 78 year old Caucasian/White female who presents for a follow-up evaluation of hypertension.  Since her last visit, she has not had any problems.  Her blood pressure was borderline this past year.  The patient has been checking her blood pressure at home. She states her systolic BP runs in the 120-130's.  The patient's current medications include: metoprolol 75mg  BID. The patient has been tolerating her medications well. The patient denies any headache, visual changes, dizziness, lightheadness, chest pain, shortness of breath, weakness/numbness, and edema.   The patient also comes in today with complaints of dysuria and frequency that started 3 days ago.  She is not having fevers, chills, nausea, vomiting, abd/back pain, or other problems.  She has been using AZO but it has not helped.     Past Medical History Past Medical History:  Diagnosis Date   Anxiety disorder    Aortic atherosclerosis (HCC)    Complication of anesthesia 2006   Drop in  Bp intra-op with Lap Chole. [employer].   Depression    Dysfunctional gallbladder    GERD (gastroesophageal reflux disease)    History of colon polyps    IBS (irritable bowel syndrome)    Internal hemorrhoids without complication    Migraine      Allergies Allergies  Allergen Reactions   Zonisamide Other (See Comments)    Unsure about this medication    Ciprofloxacin Other (See Comments) and Palpitations   Codeine Nausea Only and Other (See Comments)   Nitrofurantoin Nausea Only   Other Nausea Only     Medications  Current Outpatient Medications:    dexlansoprazole (DEXILANT) 60 MG capsule, Take 1 capsule (60 mg total) by mouth daily., Disp: 90 capsule, Rfl: 4   Galcanezumab-gnlm (EMGALITY [place]), Inject  into the skin every 30 (thirty) days. Pt unsure of dosage, Disp: , Rfl:    metoprolol tartrate (LOPRESSOR) 25 MG tablet, Take 3 tablets (75 mg total) by mouth 2 (two) times daily., Disp: 540 tablet, Rfl: 3   mupirocin cream (BACTROBAN) 2 %, Apply 1 Application topically 2 (two) times daily., Disp: 15 g, Rfl: 0   pravastatin (PRAVACHOL) 40 MG tablet, Take 1 tablet (40 mg total) by mouth at bedtime., Disp: 90 tablet, Rfl: 3   traZODone (DESYREL) 50 MG tablet, Take 1.5 tablets (75 mg total) by mouth at bedtime., Disp: 45 tablet, Rfl: 0   Review of Systems Review of Systems  Constitutional:  Negative for chills and fever.  Eyes:  Negative for blurred vision and double vision.  Respiratory:  Negative for shortness of breath.   Cardiovascular:  Negative for chest pain and palpitations.  Gastrointestinal:  Negative for abdominal pain, constipation, diarrhea, nausea and vomiting.  Genitourinary:  Positive for dysuria and frequency. Negative for flank pain and hematuria.  Skin:  Negative for itching and rash.  Neurological:  Negative for dizziness, weakness and headaches.       Objective:    Vitals BP 122/74   Pulse 62   Temp 98.4 F (36.9 C)   Resp 18   Ht 5\' 1"  (1.549 m)   Wt 123 lb 9.6 oz (56.1 kg)   SpO2 99%   BMI 23.35 kg/m    Physical  Examination Physical Exam Constitutional:      Appearance: Normal appearance. She is not ill-appearing.  Cardiovascular:     Rate and Rhythm: Normal rate and regular rhythm.     Pulses: Normal pulses.     Heart sounds: No murmur heard.    No friction rub. No gallop.  Pulmonary:     Effort: Pulmonary effort is normal. No respiratory distress.     Breath sounds: No wheezing, rhonchi or rales.  Abdominal:     General: Bowel sounds are normal. There is no distension.     Palpations: Abdomen is soft.     Tenderness: There is no abdominal tenderness.  Musculoskeletal:     Right lower leg: No edema.     Left lower leg: No edema.  Skin:     General: Skin is warm and dry.     Findings: No rash.  Neurological:     Mental Status: She is alert.        Assessment & Plan:   Essential hypertension Her BP is controlled.  We will continue on her metoprolol.   Urinary tract infection without hematuria She has a UTI via UA.  We will send for urine culture.  I will empirically start her on augmentin.    Return in about 3 months (around 03/01/2024).   Crist Fat, MD

## 2023-12-02 NOTE — Assessment & Plan Note (Signed)
She has a UTI via UA.  We will send for urine culture.  I will empirically start her on augmentin.

## 2023-12-02 NOTE — Assessment & Plan Note (Signed)
Her BP is controlled.  We will continue on her metoprolol.

## 2023-12-06 LAB — URINE CULTURE

## 2023-12-13 DIAGNOSIS — R300 Dysuria: Secondary | ICD-10-CM | POA: Diagnosis not present

## 2023-12-13 DIAGNOSIS — N905 Atrophy of vulva: Secondary | ICD-10-CM | POA: Diagnosis not present

## 2023-12-13 DIAGNOSIS — N760 Acute vaginitis: Secondary | ICD-10-CM | POA: Diagnosis not present

## 2023-12-23 DIAGNOSIS — L84 Corns and callosities: Secondary | ICD-10-CM | POA: Diagnosis not present

## 2023-12-23 DIAGNOSIS — G8929 Other chronic pain: Secondary | ICD-10-CM | POA: Diagnosis not present

## 2023-12-23 DIAGNOSIS — M2012 Hallux valgus (acquired), left foot: Secondary | ICD-10-CM | POA: Diagnosis not present

## 2023-12-23 DIAGNOSIS — M79675 Pain in left toe(s): Secondary | ICD-10-CM | POA: Diagnosis not present

## 2023-12-28 DIAGNOSIS — N362 Urethral caruncle: Secondary | ICD-10-CM | POA: Diagnosis not present

## 2023-12-28 DIAGNOSIS — N810 Urethrocele: Secondary | ICD-10-CM | POA: Diagnosis not present

## 2023-12-28 DIAGNOSIS — R300 Dysuria: Secondary | ICD-10-CM | POA: Diagnosis not present

## 2024-01-09 ENCOUNTER — Other Ambulatory Visit: Payer: Self-pay

## 2024-01-09 DIAGNOSIS — F5101 Primary insomnia: Secondary | ICD-10-CM

## 2024-01-09 DIAGNOSIS — G43719 Chronic migraine without aura, intractable, without status migrainosus: Secondary | ICD-10-CM | POA: Diagnosis not present

## 2024-01-09 MED ORDER — TRAZODONE HCL 50 MG PO TABS: 75.0000 mg | ORAL_TABLET | Freq: Every day | ORAL | 0 refills | Status: DC

## 2024-01-10 DIAGNOSIS — N362 Urethral caruncle: Secondary | ICD-10-CM | POA: Diagnosis not present

## 2024-01-24 ENCOUNTER — Other Ambulatory Visit: Payer: Self-pay | Admitting: Internal Medicine

## 2024-01-24 DIAGNOSIS — G8929 Other chronic pain: Secondary | ICD-10-CM | POA: Diagnosis not present

## 2024-01-24 DIAGNOSIS — M21612 Bunion of left foot: Secondary | ICD-10-CM | POA: Diagnosis not present

## 2024-01-24 DIAGNOSIS — M79675 Pain in left toe(s): Secondary | ICD-10-CM | POA: Diagnosis not present

## 2024-01-24 MED ORDER — PRAVASTATIN SODIUM 40 MG PO TABS: 40.0000 mg | ORAL_TABLET | Freq: Every day | ORAL | 3 refills | Status: AC

## 2024-01-24 MED ORDER — METOPROLOL TARTRATE 25 MG PO TABS: 75.0000 mg | ORAL_TABLET | Freq: Two times a day (BID) | ORAL | 3 refills | Status: DC

## 2024-01-30 ENCOUNTER — Encounter: Payer: Self-pay | Admitting: Internal Medicine

## 2024-01-30 ENCOUNTER — Ambulatory Visit: Admitting: Internal Medicine

## 2024-01-30 VITALS — BP 122/74 | HR 68 | Temp 98.0°F | Resp 18 | Ht 61.0 in | Wt 124.0 lb

## 2024-01-30 DIAGNOSIS — R052 Subacute cough: Secondary | ICD-10-CM | POA: Diagnosis not present

## 2024-01-30 DIAGNOSIS — J40 Bronchitis, not specified as acute or chronic: Secondary | ICD-10-CM | POA: Diagnosis not present

## 2024-01-30 LAB — POC COVID19 BINAXNOW: SARS Coronavirus 2 Ag: NEGATIVE

## 2024-01-30 MED ORDER — DOXYCYCLINE MONOHYDRATE 100 MG PO CAPS: 100.0000 mg | ORAL_CAPSULE | Freq: Two times a day (BID) | ORAL | 0 refills | Status: DC

## 2024-01-30 MED ORDER — FLUTICASONE PROPIONATE 50 MCG/ACT NA SUSP: 1.0000 | Freq: Two times a day (BID) | NASAL | 0 refills | Status: DC

## 2024-01-30 MED ORDER — BENZONATATE 100 MG PO CAPS: 100.0000 mg | ORAL_CAPSULE | Freq: Three times a day (TID) | ORAL | 0 refills | Status: DC | PRN

## 2024-01-30 NOTE — Progress Notes (Signed)
 Office Visit  Subjective   Patient ID: Rebecca [name]   DOB: 1946-10-10   Age: 78 y.o.   MRN: [id number]   Chief Complaint Chief Complaint  Patient presents with   office visit    Cough , congestion since 1 week ago     History of Present Illness Rebecca [name] is a 78 yo female who comes in today for an acute visit for respiratory illness.  She states her symptoms started a week ago where she had a sore throat.  She denied any sinus congestion but after a few days she stared have a runny with green nasal discharge.  She is having a mild productive cough of scant green sputum.  There is no fevers, chills, headaches, myalgias, chest congestion, SOB, wheezing, nausea, vomiting or diarrhea.  She has been taking dayquil.  She has no history of ashtma or COPD.     Past Medical History Past Medical History:  Diagnosis Date   Anxiety disorder    Aortic atherosclerosis (HCC)    Complication of anesthesia 2006   Drop in  Bp intra-op with Lap Chole. [employer].   Depression    Dysfunctional gallbladder    GERD (gastroesophageal reflux disease)    History of colon polyps    IBS (irritable bowel syndrome)    Internal hemorrhoids without complication    Migraine      Allergies Allergies  Allergen Reactions   Zonisamide Other (See Comments)    Unsure about this medication    Ciprofloxacin Other (See Comments) and Palpitations   Codeine Nausea Only and Other (See Comments)   Nitrofurantoin Nausea Only   Other Nausea Only     Medications  Current Outpatient Medications:    dexlansoprazole (DEXILANT) 60 MG capsule, Take 1 capsule (60 mg total) by mouth daily., Disp: 90 capsule, Rfl: 4   Galcanezumab-gnlm (EMGALITY [place]), Inject into the skin every 30 (thirty) days. Pt unsure of dosage, Disp: , Rfl:    metoprolol tartrate (LOPRESSOR) 25 MG tablet, Take 3 tablets (75 mg total) by mouth 2 (two) times daily., Disp: 540 tablet, Rfl: 3   mupirocin cream (BACTROBAN) 2 %, Apply 1  Application topically 2 (two) times daily., Disp: 15 g, Rfl: 0   pravastatin (PRAVACHOL) 40 MG tablet, Take 1 tablet (40 mg total) by mouth at bedtime., Disp: 90 tablet, Rfl: 3   traZODone (DESYREL) 50 MG tablet, Take 1.5 tablets (75 mg total) by mouth at bedtime., Disp: 45 tablet, Rfl: 0   Review of Systems Review of Systems  Constitutional:  Negative for chills and fever.  HENT:  Negative for congestion.   Cardiovascular:  Negative for chest pain and leg swelling.  Skin:  Negative for rash.  Neurological:  Negative for dizziness and weakness.       Objective:    Vitals BP 122/74 (BP Location: Left Arm, Patient Position: Sitting)   Pulse 68   Temp 98 F (36.7 C)   Resp 18   Ht 5\' 1"  (1.549 m)   Wt 124 lb (56.2 kg)   SpO2 93%   BMI 23.43 kg/m    Physical Examination Physical Exam     Assessment & Plan:   Bronchitis We will start her on doxycycline and give her flonase for sinus congestion she just developed.  I want her to take mucinex DM and we will give her tessalon pearles for cough at night.  Continue supportive care.    No follow-ups on file.   Michel Santee  Eda Paschal, MD

## 2024-01-30 NOTE — Assessment & Plan Note (Signed)
 We will start her on doxycycline and give her flonase for sinus congestion she just developed.  I want her to take mucinex DM and we will give her tessalon pearles for cough at night.  Continue supportive care.

## 2024-01-31 ENCOUNTER — Other Ambulatory Visit: Payer: Self-pay

## 2024-01-31 MED ORDER — FLUTICASONE PROPIONATE 50 MCG/ACT NA SUSP: 1.0000 | Freq: Two times a day (BID) | NASAL | 0 refills | Status: DC

## 2024-02-01 DIAGNOSIS — I2699 Other pulmonary embolism without acute cor pulmonale: Secondary | ICD-10-CM | POA: Diagnosis not present

## 2024-02-01 DIAGNOSIS — J9600 Acute respiratory failure, unspecified whether with hypoxia or hypercapnia: Secondary | ICD-10-CM | POA: Diagnosis not present

## 2024-02-01 DIAGNOSIS — I2602 Saddle embolus of pulmonary artery with acute cor pulmonale: Secondary | ICD-10-CM | POA: Diagnosis not present

## 2024-02-01 DIAGNOSIS — M546 Pain in thoracic spine: Secondary | ICD-10-CM | POA: Diagnosis not present

## 2024-02-01 DIAGNOSIS — G4762 Sleep related leg cramps: Secondary | ICD-10-CM | POA: Diagnosis not present

## 2024-02-01 DIAGNOSIS — Z6828 Body mass index (BMI) 28.0-28.9, adult: Secondary | ICD-10-CM | POA: Diagnosis not present

## 2024-02-05 ENCOUNTER — Other Ambulatory Visit: Payer: Self-pay | Admitting: Internal Medicine

## 2024-02-05 DIAGNOSIS — F5101 Primary insomnia: Secondary | ICD-10-CM

## 2024-02-23 ENCOUNTER — Ambulatory Visit: Payer: Medicare Other | Admitting: Internal Medicine

## 2024-02-23 ENCOUNTER — Encounter: Payer: Self-pay | Admitting: Internal Medicine

## 2024-02-23 VITALS — BP 118/66 | HR 64 | Temp 98.1°F | Resp 18 | Ht 61.0 in | Wt 123.2 lb

## 2024-02-23 DIAGNOSIS — M1711 Unilateral primary osteoarthritis, right knee: Secondary | ICD-10-CM | POA: Diagnosis not present

## 2024-02-23 DIAGNOSIS — M8000XD Age-related osteoporosis with current pathological fracture, unspecified site, subsequent encounter for fracture with routine healing: Secondary | ICD-10-CM

## 2024-02-23 DIAGNOSIS — I10 Essential (primary) hypertension: Secondary | ICD-10-CM

## 2024-02-23 DIAGNOSIS — M25561 Pain in right knee: Secondary | ICD-10-CM | POA: Diagnosis not present

## 2024-02-23 DIAGNOSIS — G8929 Other chronic pain: Secondary | ICD-10-CM | POA: Diagnosis not present

## 2024-02-23 NOTE — Assessment & Plan Note (Signed)
 Her BP is doing well and we will continue on her metoprolol.

## 2024-02-23 NOTE — Progress Notes (Signed)
 Office Visit  Subjective   Patient ID: Rebecca [name]   DOB: [birth year]   Age: 78 y.o.   MRN: [id number]   Chief Complaint Chief Complaint  Patient presents with   Follow-up     History of Present Illness The patient is a 78 year old Caucasian/White female who presents for a follow-up evaluation of hypertension.  Since her last visit, she has not had any problems.  Her blood pressure was borderline this past year.  The patient has been checking her blood pressure at home. She states her systolic BP runs in the 120-130's.  The patient's current medications include: metoprolol 75mg  BID. The patient has been tolerating her medications well. The patient denies any headache, visual changes, dizziness, lightheadness, chest pain, shortness of breath, weakness/numbness, and edema.  Rebecca [name] has a known history of osteoporosis as well as a history of scoliosis and thoracic vertebral compression fractures.  In 2023, she fell where she slipped while it was raining in 09/2022 and was discovered to have a new mild compression fracture of T9.  She went to the ER and they did a CT of her chest which showed a new mild compression fracture at T9 but no acute rib fractures.  She tells me they told her she had rib fractures and I asked her why she didn't come see me after this and she does not know why.  She was sent home in a TLSO brace and followed up with ortho where he recommended conservative treatment due to her history of prior compression fracture and severe osteoporosis.  She was given oxycodone at that time.  She did go to see ortho in 12/2022 but the patient was told that her compression fracture was healing.  She states she has not had any repeat xrays.  [facility] [place]. She had an accidental fall in 10/2020 where she slipped on ice and sustained T4 and T5 vertebral fractures.  She did see the [facility] center where it sounds like they did  vertebroplasty on one of her vertebra.  She was placed on flexeril and ultram at that time.  She states she has had back pain since she was a child and has a history of scoliosis.  She can have back pain when she stands for a while.  She will have back pain maybe 4 days of the week.  Her back pain can be intermittent and severe at times but this depends on what she does.  Staying on her feet for long periods of time makes it worse.  There is no loss of bowel/bladder function and she denies any new weakness/numbness.  We did a chest xray at that time looking for a rib fracture.  This was done in 02/2021 and showed prior upper thoracic vertebroplasty with multiple thoracic spine compression fractures some which are new compared to her MRI done on 01/09/2021.  She did go see [facility] these new compression fractures.  They did not repeat a MRI but she states they gave her ESI's which did help with her pain.  She states they are going to do a radiofrequency ablation to nerve but this was never done.  I sent her for a bone density which was done in 03/03/2022 and this showed a t-score of -6.1.  We therefore set her up for reclast injections where she received an infusion on 04/08/2022 and her 2nd injection was done on 04/11/2023.  She had no side effects from her iv reclast.   She is due for her last reclast injection in 04/2024.     Past Medical History Past Medical History:  Diagnosis Date   Anxiety disorder    Aortic atherosclerosis (HCC)    Complication of anesthesia 2006   Drop in  Bp intra-op with Lap Chole. [employer].   Depression    Dysfunctional gallbladder    GERD (gastroesophageal reflux disease)    History of colon polyps    IBS (irritable bowel syndrome)    Internal hemorrhoids without complication    Migraine      Allergies Allergies  Allergen Reactions   Zonisamide Other (See Comments)    Unsure about this medication    Ciprofloxacin Other (See Comments) and  Palpitations   Codeine Nausea Only and Other (See Comments)   Nitrofurantoin Nausea Only   Other Nausea Only     Medications  Current Outpatient Medications:    benzonatate (TESSALON PERLES) 100 MG capsule, Take 1 capsule (100 mg total) by mouth 3 (three) times daily as needed for cough., Disp: 20 capsule, Rfl: 0   dexlansoprazole (DEXILANT) 60 MG capsule, Take 1 capsule (60 mg total) by mouth daily., Disp: 90 capsule, Rfl: 4   doxycycline (MONODOX) 100 MG capsule, Take 1 capsule (100 mg total) by mouth 2 (two) times daily., Disp: 20 capsule, Rfl: 0   fluticasone (FLONASE) 50 MCG/ACT nasal spray, Place 1 spray into both nostrils in the morning and at bedtime., Disp: 15.8 mL, Rfl: 0   Galcanezumab-gnlm (EMGALITY [place]), Inject into the skin every 30 (thirty) days. Pt unsure of dosage, Disp: , Rfl:    metoprolol tartrate (LOPRESSOR) 25 MG tablet, Take 3 tablets (75 mg total) by mouth 2 (two) times daily., Disp: 540 tablet, Rfl: 3   mupirocin cream (BACTROBAN) 2 %, Apply 1 Application topically 2 (two) times daily., Disp: 15 g, Rfl: 0   pravastatin (PRAVACHOL) 40 MG tablet, Take 1 tablet (40 mg total) by mouth at bedtime., Disp: 90 tablet, Rfl: 3   traZODone (DESYREL) 50 MG tablet, TAKE 1 AND 1/2 TABLETS(75 MG) BY MOUTH AT BEDTIME, Disp: 45 tablet, Rfl: 0   Review of Systems Review of Systems  Constitutional:  Negative for chills and fever.  Eyes:  Negative for blurred vision and double vision.  Respiratory:  Negative for shortness of breath.   Cardiovascular:  Negative for chest pain, palpitations and leg swelling.  Gastrointestinal:  Negative for abdominal pain, constipation, diarrhea, nausea and vomiting.  Neurological:  Negative for dizziness, weakness and headaches.       Objective:    Vitals BP 118/66   Pulse 64   Temp 98.1 F (36.7 C)   Resp 18   Ht 5\' 1"  (1.549 m)   Wt 123 lb 3.2 oz (55.9 kg)   SpO2 95%   BMI 23.28 kg/m    Physical Examination Physical  Exam Constitutional:      Appearance: Normal appearance. She is not ill-appearing.  Cardiovascular:     Rate and Rhythm: Normal rate and regular rhythm.     Pulses: Normal pulses.     Heart sounds: No murmur heard.    No friction rub. No gallop.  Pulmonary:     Effort: Pulmonary effort is normal. No respiratory distress.     Breath sounds: No wheezing, rhonchi or rales.  Abdominal:     General: Bowel sounds are normal. There is no distension.     Palpations: Abdomen is  soft.     Tenderness: There is no abdominal tenderness.  Musculoskeletal:     Right lower leg: No edema.     Left lower leg: No edema.  Skin:    General: Skin is warm and dry.     Findings: No rash.  Neurological:     Mental Status: She is alert.        Assessment & Plan:   Essential hypertension Her BP is doing well and we will continue on her metoprolol.  Age-related osteoporosis with current pathological fracture She will need to see the nurse in 2 months for a CMP for her next round of iv reclast.  We had to stop her calcium supplmentation due to elevated calcium level.  We will arrange for her iv reclast in 04/2024.    Return in about 4 months (around 06/24/2024).   Wayne Haines, MD

## 2024-02-23 NOTE — Assessment & Plan Note (Signed)
 She will need to see the nurse in 2 months for a CMP for her next round of iv reclast.  We had to stop her calcium supplmentation due to elevated calcium level.  We will arrange for her iv reclast in 04/2024.

## 2024-02-24 ENCOUNTER — Ambulatory Visit: Payer: Medicare Other | Admitting: Internal Medicine

## 2024-02-27 ENCOUNTER — Other Ambulatory Visit: Payer: Self-pay | Admitting: Internal Medicine

## 2024-02-28 DIAGNOSIS — M1711 Unilateral primary osteoarthritis, right knee: Secondary | ICD-10-CM | POA: Diagnosis not present

## 2024-04-24 ENCOUNTER — Ambulatory Visit: Admitting: Internal Medicine

## 2024-04-24 DIAGNOSIS — E7800 Pure hypercholesterolemia, unspecified: Secondary | ICD-10-CM

## 2024-04-24 DIAGNOSIS — I10 Essential (primary) hypertension: Secondary | ICD-10-CM

## 2024-04-24 NOTE — Progress Notes (Signed)
 Nurse Visit Lab Draw CMP

## 2024-04-25 LAB — CMP14 + ANION GAP
ALT: 23 IU/L (ref 0–32)
AST: 28 IU/L (ref 0–40)
Albumin: 4.5 g/dL (ref 3.8–4.8)
Alkaline Phosphatase: 65 IU/L (ref 44–121)
Anion Gap: 14 mmol/L (ref 10.0–18.0)
BUN/Creatinine Ratio: 20 (ref 12–28)
BUN: 19 mg/dL (ref 8–27)
Bilirubin Total: 0.5 mg/dL (ref 0.0–1.2)
CO2: 21 mmol/L (ref 20–29)
Calcium: 9.8 mg/dL (ref 8.7–10.3)
Chloride: 106 mmol/L (ref 96–106)
Creatinine, Ser: 0.96 mg/dL (ref 0.57–1.00)
Globulin, Total: 1.9 g/dL (ref 1.5–4.5)
Glucose: 78 mg/dL (ref 70–99)
Potassium: 4.7 mmol/L (ref 3.5–5.2)
Sodium: 141 mmol/L (ref 134–144)
Total Protein: 6.4 g/dL (ref 6.0–8.5)
eGFR: 62 mL/min/{1.73_m2} (ref >59)

## 2024-04-26 ENCOUNTER — Ambulatory Visit: Payer: Self-pay

## 2024-04-26 NOTE — Progress Notes (Signed)
 Patient called.  Patient aware.  I have called and informed the patient  Her CMP was normal.  Were we doing this for prolia? .  Pt aware.

## 2024-05-07 DIAGNOSIS — M810 Age-related osteoporosis without current pathological fracture: Secondary | ICD-10-CM | POA: Diagnosis not present

## 2024-05-09 ENCOUNTER — Other Ambulatory Visit: Payer: Self-pay

## 2024-05-09 MED ORDER — METOPROLOL TARTRATE 25 MG PO TABS: 75.0000 mg | ORAL_TABLET | Freq: Two times a day (BID) | ORAL | 3 refills | Status: AC

## 2024-05-17 ENCOUNTER — Other Ambulatory Visit: Payer: Self-pay | Admitting: Internal Medicine

## 2024-05-17 DIAGNOSIS — F5101 Primary insomnia: Secondary | ICD-10-CM

## 2024-05-22 DIAGNOSIS — Z01419 Encounter for gynecological examination (general) (routine) without abnormal findings: Secondary | ICD-10-CM | POA: Diagnosis not present

## 2024-05-23 ENCOUNTER — Other Ambulatory Visit: Payer: Self-pay

## 2024-05-23 ENCOUNTER — Ambulatory Visit (INDEPENDENT_AMBULATORY_CARE_PROVIDER_SITE_OTHER): Admitting: Gastroenterology

## 2024-05-23 ENCOUNTER — Encounter: Payer: Self-pay | Admitting: Gastroenterology

## 2024-05-23 VITALS — BP 118/68 | HR 62 | Resp 98 | Ht 59.0 in | Wt 120.0 lb

## 2024-05-23 DIAGNOSIS — K581 Irritable bowel syndrome with constipation: Secondary | ICD-10-CM | POA: Diagnosis not present

## 2024-05-23 DIAGNOSIS — K219 Gastro-esophageal reflux disease without esophagitis: Secondary | ICD-10-CM

## 2024-05-23 DIAGNOSIS — K449 Diaphragmatic hernia without obstruction or gangrene: Secondary | ICD-10-CM | POA: Diagnosis not present

## 2024-05-23 DIAGNOSIS — Z800 Family history of malignant neoplasm of digestive organs: Secondary | ICD-10-CM

## 2024-05-23 DIAGNOSIS — R1084 Generalized abdominal pain: Secondary | ICD-10-CM

## 2024-05-23 NOTE — Patient Instructions (Addendum)
 Recommend Low fod map diet Keep food diary Drink plenty of water   Recommend walking after meals  Samples IBgard 2 capsules with meals   _______________________________________________________  If your blood pressure at your visit was 140/90 or greater, please contact your primary care physician to follow up on this.  _______________________________________________________  If you are age 76 or older, your body mass index should be between 23-30. Your Body mass index is 24.24 kg/m. If this is out of the aforementioned range listed, please consider follow up with your Primary Care Provider.  If you are age 9 or younger, your body mass index should be between 19-25. Your Body mass index is 24.24 kg/m. If this is out of the aformentioned range listed, please consider follow up with your Primary Care Provider.   ________________________________________________________  The  GI providers would like to encourage you to use MYCHART to communicate with providers for non-urgent requests or questions.  Due to long hold times on the telephone, sending your provider a message by [facility] Kaycen Whitworth be a faster and more efficient way to get a response.  Please allow 48 business hours for a response.  Please remember that this is for non-urgent requests.  _______________________________________________________  Thank you for trusting me with your gastrointestinal care. Georgiana Spillane, RNP

## 2024-05-23 NOTE — Progress Notes (Signed)
 Chief Complaint:abd pain Primary GI Doctor:Dr. Charlanne  HPI:  Patient is a  78  year old female patient with past medical history of IBS-C, GERD with small HH, who presents for a complaint of abdominal pain. Last seen by Dr. Charlanne on 08/15/23 for follow-up on abd pain, IBS, and GERD.   Interval History    Patient presents with main complaint of generalized abdominal pain she reports lasts for about 30 minutes then resolves on its own. Patient reports pain Marnee Sherrard increase with eating. She tends to eat small meals throughout day. Cannot pinpoint any particular foods.She reports her symptoms have been going on for about 2-3 mths. Patient on colace 3/day for constipation.  No nausea or vomiting. Her weight is 120 lbs today, she has lost a few lbs. She reports she eats small meals thorough out day with [employer]. Denies external stress at home.      Patient has history of GERD with small HH occasional dysphagia d/t presbyesophagus and taking Dexilant  60 mg po as needed. Not currently having any issues.   Additional GI history: -Sister had metastatic pancreatic cancer -History of abnormal liver function tests due to fatty liver diagnosed 2002, had liver biopsy 12/2004.  Negative autoimmune hepatitis profile, acute hepatitis profile, ANA   Previous GI workup: Negative CT AP 01/11/2023 with contrast. No acute findings within the abdomen or pelvis. Tiny left renal calculi. No evidence of ureteral calculi or hydronephrosis. Aortic Atherosclerosis (ICD10-I70.0).   EGD 12/01/2021 -Mild presbyesophagus s/p dil 50 FR -2 cm HH -Atrophic gastritis -Few gastric polyps-previously determined to be fundic gland polyps. -Neg gastric Bx   EGD 07/24/2019 -Small HH, mild gastritis -Multiple gastric polyps s/p polypectomy x 3/ Bx-fundic gland polyps -Negative gastric and small bowel biopsies.   Colon: 07/24/2019 -Colonic polyps s/p polypectomy. -Mild sigmoid diverticulosis -Otherwise normal colonoscopy to TI.   Highly redundant colon. -Bx- TA -Rpt 2027 (if needed)   Imaging:  Neg CT scan of the abdomen/pelvis February 2019 Neg CT scan of abdomen/pelvis November 2021 Neg CT scan abd/pelvis w contrast March 2024   Wt Readings from Last 3 Encounters:  05/23/24 120 lb (54.4 kg)  02/23/24 123 lb 3.2 oz (55.9 kg)  01/30/24 124 lb (56.2 kg)    Past Medical History:  Diagnosis Date   Anxiety disorder    Aortic atherosclerosis (HCC)    Complication of anesthesia 2006   Drop in  Bp intra-op with Lap Chole. [employer].   Depression    Dysfunctional gallbladder    GERD (gastroesophageal reflux disease)    History of colon polyps    IBS (irritable bowel syndrome)    Internal hemorrhoids without complication    Migraine     Past Surgical History:  Procedure Laterality Date   ABDOMINAL HYSTERECTOMY  1984   APPENDECTOMY  1967   AUGMENTATION MAMMAPLASTY Bilateral    BLADDER SUSPENSION  06/23/2011   Procedure: TRANSVAGINAL TAPE (TVT) PROCEDURE;  Surgeon: Rosaline DELENA Luna;  Location: [facility];  Service: Gynecology;  Laterality: N/A;   BREAST SURGERY     implants due to removal of breast tissue   CATARACT EXTRACTION     Inserted prosthetic lens   CHOLECYSTECTOMY     due to biliary dyskinesia   COLONOSCOPY  10/11/2016   Minimal sigmoid diverticulosis. Otherwise normal colonsocopy.    CYSTOCELE REPAIR  06/23/2011   Procedure: ANTERIOR REPAIR (CYSTOCELE);  Surgeon: Rosaline DELENA Luna;  Location: [facility];  Service: Gynecology;  Laterality: N/A;   CYSTOSCOPY  06/23/2011  Procedure: CYSTOSCOPY;  Surgeon: Rosaline DELENA Luna;  Location: [facility];  Service: Gynecology;  Laterality: N/A;   DNS s/p nasal surgery     ESOPHAGOGASTRODUODENOSCOPY  03/03/2016   Small hiatal hernia. Incidental gastric polyps. Mild gastritis.    EYE MUSCLE SURGERY  as child   HERNIA REPAIR  12/30/2010   inguinal. performed by Dr Bert    KNEE ARTHROPLASTY  2006   MASTECTOMY Bilateral    pt states that 95% of tissue  removed   OVARIAN CYST REMOVAL     bilateral   RECTOCELE REPAIR  11/10/2011   Procedure: POSTERIOR REPAIR (RECTOCELE);  Surgeon: Rosaline DELENA Luna, MD;  Location: [facility];  Service: Gynecology;  Laterality: N/A;   RECTOCELE REPAIR N/A 07/03/2014   Procedure: revision of POSTERIOR REPAIR (RECTOCELE) with bladder instillation ;  Surgeon: Rosaline DELENA Luna, MD;  Location: [facility];  Service: Gynecology;  Laterality: N/A;   RECTOCELE REPAIR N/A 07/03/2014   Procedure: POSTERIOR REPAIR (RECTOCELE) with suture repair for hemostasis ;  Surgeon: Rosaline DELENA Luna, MD;  Location: [facility];  Service: Gynecology;  Laterality: N/A;   ROTATOR CUFF REPAIR Left    SHOULDER SURGERY Right 06/2019    Current Outpatient Medications  Medication Sig Dispense Refill   dexlansoprazole  (DEXILANT ) 60 MG capsule Take 1 capsule (60 mg total) by mouth daily. 90 capsule 4   Galcanezumab-gnlm (EMGALITY Ernest) Inject into the skin every 30 (thirty) days. Pt unsure of dosage     metoprolol  tartrate (LOPRESSOR ) 25 MG tablet Take 3 tablets (75 mg total) by mouth 2 (two) times daily. 540 tablet 3   pravastatin  (PRAVACHOL ) 40 MG tablet Take 1 tablet (40 mg total) by mouth at bedtime. 90 tablet 3   traZODone  (DESYREL ) 50 MG tablet TAKE ONE AND ONE-HALF TABLETS BY MOUTH EVERY DAY AT BEDTIME 100 tablet 1   No current facility-administered medications for this visit.    Allergies as of 05/23/2024 - Review Complete 05/23/2024  Allergen Reaction Noted   Zonisamide  Other (See Comments) 12/16/2021   Ciprofloxacin  Other (See Comments) and Palpitations 10/26/2018   Codeine Nausea Only and Other (See Comments) 06/10/2011   Nitrofurantoin Nausea Only 12/16/2021   Other Nausea Only 09/08/2022    Family History  Problem Relation Age of Onset   Breast cancer Mother        diagnosed in her 56s   Pancreatic cancer Sister        metastatic   Cancer Brother        84   Cancer Maternal Grandmother    Cancer Maternal Grandfather    Cancer  Nephew    Cancer Niece    Colon cancer Neg Hx    Esophageal cancer Neg Hx    Rectal cancer Neg Hx    Stomach cancer Neg Hx     Review of Systems:    Constitutional: No weight loss, fever, chills, weakness or fatigue HEENT: Eyes: No change in vision               Ears, Nose, Throat:  No change in hearing or congestion Skin: No rash or itching Cardiovascular: No chest pain, chest pressure or palpitations   Respiratory: No SOB or cough Gastrointestinal: See HPI and otherwise negative Genitourinary: No dysuria or change in urinary frequency Neurological: No headache, dizziness or syncope Musculoskeletal: No new muscle or joint pain Hematologic: No bleeding or bruising Psychiatric: No history of depression or anxiety    Physical Exam:  Vital signs: BP 118/68  Pulse 62   Resp (!) 98   Ht 4' 11 (1.499 m)   Wt 120 lb (54.4 kg)   BMI 24.24 kg/m   Constitutional:  Pleasant  female appears to be in NAD, Well developed, Well nourished, alert and cooperative Throat: Oral cavity and pharynx without inflammation, swelling or lesion.  Respiratory: Respirations even and unlabored. Lungs clear to auscultation bilaterally.   No wheezes, crackles, or rhonchi.  Cardiovascular: Normal S1, S2. Regular rate and rhythm. No peripheral edema, cyanosis or pallor.  Gastrointestinal:  Soft, nondistended, nontender. No rebound or guarding. Normal bowel sounds. No appreciable masses or hepatomegaly. Rectal:  Not performed.  Msk:  Symmetrical without gross deformities. Without edema, no deformity or joint abnormality.  Neurologic:  Alert and  oriented x4;  grossly normal neurologically.  Skin:   Dry and intact without significant lesions or rashes. Psychiatric: Oriented to person, place and time. Demonstrates good judgement and reason without abnormal affect or behaviors.  RELEVANT LABS AND IMAGING: CBC    Latest Ref Rng & Units 11/04/2023   11:07 AM 08/29/2023   11:39 AM 03/10/2023    9:35 AM  CBC   WBC 4.0 - 10.5 K/uL 6.6  4.5  3.5   Hemoglobin 12.0 - 15.0 g/dL 87.2  86.5  87.1   Hematocrit 36.0 - 46.0 % 39.0  41.0  40.8   Platelets 150 - 400 K/uL 125  138  138      CMP     Latest Ref Rng & Units 04/24/2024   10:52 AM 11/04/2023   11:07 AM 08/29/2023   11:39 AM  CMP  Glucose 70 - 99 mg/dL 78  92  89   BUN 8 - 27 mg/dL 19  18  17    Creatinine 0.57 - 1.00 mg/dL 9.03  8.99  9.11   Sodium 134 - 144 mmol/L 141  141  140   Potassium 3.5 - 5.2 mmol/L 4.7  4.4  5.1   Chloride 96 - 106 mmol/L 106  106  105   CO2 20 - 29 mmol/L 21  26  24    Calcium  8.7 - 10.3 mg/dL 9.8  9.4  9.6   Total Protein 6.0 - 8.5 g/dL 6.4  6.4  6.5   Total Bilirubin 0.0 - 1.2 mg/dL 0.5  0.6  0.5   Alkaline Phos 44 - 121 IU/L 65  65  67   AST 0 - 40 IU/L 28  30  29    ALT 0 - 32 IU/L 23  28  25       Lab Results  Component Value Date   TSH 1.790 08/29/2023     Assessment/Plan: Encounter Diagnoses  Name Primary?   Hiatal hernia with GERD Yes   Generalized abdominal pain    Irritable bowel syndrome with constipation    Family history of pancreatic cancer   #1. Gen abdo pain (after fall)- neg CT AP 01/2023-resolved. CMP normal (04/2024).   #2. H/O tubular adenomas.  Next colon due 07/2026 only if medically stable.   #3. IBS-C (H/O diarrhea in the past). Neg colon 07/2019 except for mild sigmoid diverticulosis, small tubular adenomas.    #4. GERD with small HH and occ dysphagia d/t presbyesophagus s/p dil Jan 2023   #5. FH metastatic pancreatic cancer (sister).   Plan: -samples of IBgard 2 capsules with meals -Recommend low fodmap diet, keep food dairy -if no improvement we discussing pursuing more workup with labs and imaging  Thank you for the courtesy of  this consult. Please call me with any questions or concerns.   Nakira Litzau, FNP-C [facility] 05/23/2024, 11:35 AM  Cc: Fleeta Valeria Mayo, MD

## 2024-05-24 DIAGNOSIS — G43719 Chronic migraine without aura, intractable, without status migrainosus: Secondary | ICD-10-CM | POA: Diagnosis not present

## 2024-05-25 ENCOUNTER — Other Ambulatory Visit: Payer: Self-pay | Admitting: Obstetrics and Gynecology

## 2024-05-25 DIAGNOSIS — Z1231 Encounter for screening mammogram for malignant neoplasm of breast: Secondary | ICD-10-CM

## 2024-06-13 DIAGNOSIS — X58XXXA Exposure to other specified factors, initial encounter: Secondary | ICD-10-CM | POA: Diagnosis not present

## 2024-06-13 DIAGNOSIS — M4854XA Collapsed vertebra, not elsewhere classified, thoracic region, initial encounter for fracture: Secondary | ICD-10-CM | POA: Diagnosis not present

## 2024-06-13 DIAGNOSIS — I440 Atrioventricular block, first degree: Secondary | ICD-10-CM | POA: Diagnosis not present

## 2024-06-13 DIAGNOSIS — M549 Dorsalgia, unspecified: Secondary | ICD-10-CM | POA: Diagnosis not present

## 2024-06-13 DIAGNOSIS — S22060A Wedge compression fracture of T7-T8 vertebra, initial encounter for closed fracture: Secondary | ICD-10-CM | POA: Diagnosis not present

## 2024-06-17 NOTE — Progress Notes (Signed)
 [facility] Orthopaedic Spine Surgery Outpatient Visit     Rebecca [name] DOB: 05-29-1946 MRN: [id number] 06/19/2024  Chief Complaint  Patient presents with  . Middle Back - Pain    PCP: No primary care provider on file. Referring Provider: No ref. provider found  HISTORY: Rebecca [name] is a 78 y.o.  female patient that is seen as a initial evaluation.  Patient has a history of chronic mechanical low back pain.  She has a history of a T5 kyphoplasty performed by Dr. Gust in 12/12/2020.  She also suffered a T5 compression fracture after slipping on ice on 10/28/2020.  Patient had a previous subacute compression fracture at T3 and a chronic T6 compression fracture when she was last seen in 2022.  She has also history of compression fractures at T6 and T3.  She has thoracic kyphosis secondary to her multiple fractures.  She had MBB's performed by Dr. Zollo 07/28/2021.  She has a past medical history of migraines, GERD, left and right shoulder surgery in 2010 and 2020 respectively.  Physical EXAM: VITALS: BP 131/88   Pulse 70   Ht 4' 11 (1.499 m)   Wt 125 lb (56.7 kg)   SpO2 98%   BMI 25.25 kg/m  BMI Readings from Last 1 Encounters:  06/19/24 25.25 kg/m   Spine Musculoskeletal Exam  Gait   Gait is normal.  Inspection   Leg length disparity: no discrepancy   Thoracolumbar   Erythema: none   Swelling: none   Edema (right lower extremity): none       Prior incision: none  Palpation   Thoracolumbar   Tenderness: present     Paraspinous: right and left  Strength   Thoracolumbar      Right     Tibialis anterior: 5/5.      Tibialis posterior: 5/5.      Plantar flexion: 5/5.      Quadriceps: 5/5.      Hamstring: 5/5.      Hip abductors: 5/5.      Hip flexion: 5/5.      Hip adduction: 5/5.       Left     Tibialis anterior: 5/5.      Tibialis posterior: 5/5.      Plantar flexion: 5/5.      Quadriceps: 5/5.      Hamstring: 5/5.       Hip abductors: 5/5.      Hip flexion: 5/5.      Hip adduction: 5/5.    Sensory   Thoracolumbar     Right     Anterior thigh: normal     Medial thigh: normal     Posterior thigh: normal     Lateral thigh: normal     Anterior knee: normal     Anterior leg: normal     Medial leg: normal     Posterior leg: normal     Lateral leg: normal     Dorsum foot: normal     Lateral foot: normal     Plantar foot: normal     First web space: normal     Left     Anterior thigh: normal     Medial thigh: normal     Posterior thigh: normal     Lateral thigh: normal     Anterior knee: normal     Anterior leg: normal     Medial leg: normal     Posterior leg: normal  Lateral leg: normal     Dorsum foot: normal     Lateral foot: normal     Plantar foot: normal     First web space: normal  Reflexes   Thoracolumbar reflexes are normal.   Right     Quadriceps: 2/4     Achilles: 2/4    Left     Quadriceps: 2/4     Achilles: 2/4  Special Tests   Spine special tests additional comments: Tenderness mid thoracic spine.  No neurologic deficit   IMAGING: Thoracic MRI: Acute compression fracture at T7.     Problem List:   ICD-10-CM   1. Crush fracture of vertebra due to osteoporosis, initial encounter (*)  M80.08XA Ambulatory Referral to Endocrinology    2. Fracture of vertebra due to osteoporosis, initial encounter (*)  M80.08XA       Assessment/Plan: 78 year old female that is seen today as a follow-up evaluation.  The patient was previously seen by Dr. Gust.  He performed a kyphoplasty at T5 on T4/2/22.  Patient is presenting an acute acute compression fracture at T7.  This fracture happened without a fall.  She has a previous history of T3, T5, T6, and now T7 compression fracture.  She has a DEXA scan from 2 years ago of -6.1.  This is severe osteoporosis.  Patient has failed Reclast .  Patient was interested in possible kyphoplasty.  I discussed with the patient that the pedicle  of T7 is very small I could be retaining a bit challenging to perform a kyphoplasty at this level.  However, considering all of her previous compression fractures, that continued happening without trauma, patient should switch from Reclast  to a medication like Tymlos.  Tymlos will actually make her fracture heal and also prevent additional fractures.  She was given a sample.  She will be referred to the osteoporosis clinic.  Follow up in about 4 weeks (around 07/17/2024).  Orders Placed This Encounter  Procedures  . Ambulatory Referral to Endocrinology    Referral Priority:   Routine    Referral Type:   Consultation    Referral Reason:   Evaluate and Return    Referred to Provider:   Corean Halon, FNP    Requested Specialty:   Endocrinology    Number of Visits Requested:   1    Expiration Date:   12/15/2024    On the day of the encounter, a total of 30 minutes were spent on this patient encounter including history, physical exam, review of historical information, review of imaging, assessment, plan, documentation.   Electronically signed by Donnajean Eddy, MD on 06/19/2024 at 3:26 PM

## 2024-06-22 ENCOUNTER — Ambulatory Visit: Admitting: Internal Medicine

## 2024-06-22 ENCOUNTER — Encounter: Payer: Self-pay | Admitting: Internal Medicine

## 2024-06-22 VITALS — BP 128/80 | HR 61 | Temp 97.6°F | Resp 16 | Ht 59.0 in | Wt 119.8 lb

## 2024-06-22 DIAGNOSIS — R52 Pain, unspecified: Secondary | ICD-10-CM | POA: Insufficient documentation

## 2024-06-22 DIAGNOSIS — M8000XD Age-related osteoporosis with current pathological fracture, unspecified site, subsequent encounter for fracture with routine healing: Secondary | ICD-10-CM | POA: Diagnosis not present

## 2024-06-22 MED ORDER — OXYCODONE HCL 5 MG PO TABS: ORAL_TABLET | ORAL | 0 refills | Status: DC

## 2024-06-22 NOTE — Progress Notes (Signed)
 Office Visit  Subjective   Patient ID: Rebecca [name]   DOB: 11/01/1946   Age: 78 y.o.   MRN: [id number]   Chief Complaint Chief Complaint  Patient presents with   Follow-up    4 month follow up     History of Present Illness Mrs. Lasalle has a known history of osteoporosis as well as a history of scoliosis and thoracic vertebral compression fractures. The patient states that 2 weeks ago she began mid back pain where she went to the ER.  They did a CTA of the chest  on 06/13/2024 and this showed no evidence of PE, a stable right lung nodule and old compression fractures and kyphoplasties.  They did a MRI of her thoracic spine on 06/13/2024 that showed an acute compression fracture of T7 anterior superior endplate with minimal height loss. Multiple additional chronic compression fracture deformities with most advanced height loss at T5 and T9, with associated exaggerated thoracic kyphosis. No substantial spinal canal or neural foraminal narrowing.  She therefore called the [facility] [place] whom she saw on 06/19/2024 where she was interested in possible kyphoplasty.  He felt that the pedicle of T7 is very small and they felt it would be challenging to perform a kyphoplasty at this level.  However, considering all of her previous compression fractures, that continued happening without trauma, patient should switch from Reclast  to a medication like Tymlos.  They felt that Tymlos will actually make her fracture heal and also prevent additional fractures.  She was given a sample.  She will be referred to the osteoporosis clinic.  However, the patient states they did not address her pain.  She was given oxycodone  5mg  po every 6 hours prn which she states does not control her pain.  Today, she states the pain is a sharp intermitent pain rated a 7 out 10 most of the time.  Again, in 2023, she fell where she slipped while it was raining in 09/2022 and was discovered to have a new mild  compression fracture of T9.  She went to the ER and they did a CT of her chest which showed a new mild compression fracture at T9 but no acute rib fractures.  She tells me they told her she had rib fractures and I asked her why she didn't come see me after this and she does not know why.  She was sent home in a TLSO brace and followed up with ortho where he recommended conservative treatment due to her history of prior compression fracture and severe osteoporosis.  She was given oxycodone  at that time.  She did go to see ortho in 12/2022 but the patient was told that her compression fracture was healing.  She states she has not had any repeat xrays.  She was followed by [facility] in [place]. She had an accidental fall in 10/2020 where she slipped on ice and sustained T4 and T5 vertebral fractures.  She did see the pain and scoliosis center where it sounds like they did vertebroplasty on one of her vertebra.  She was placed on flexeril  and ultram at that time.  She states she has had back pain since she was a child and has a history of scoliosis.  She can have back pain when she stands for a while.  She will have back pain maybe 4 days of the week.  Her back pain can be intermittent and severe at times but this depends  on what she does.  Staying on her feet for long periods of time makes it worse.  There is no loss of bowel/bladder function and she denies any new weakness/numbness.  We did a chest xray at that time looking for a rib fracture.  This was done in 02/2021 and showed prior upper thoracic vertebroplasty with multiple thoracic spine compression fractures some which are new compared to her MRI done on 01/09/2021.  She did go see the [facility] these new compression fractures.  They did not repeat a MRI but she states they gave her ESI's which did help with her pain.  She states they are going to do a radiofrequency ablation to nerve but this was never done.  I sent her for a  bone density which was done in 03/03/2022 and this showed a t-score of -6.1.  We therefore set her up for reclast  injections where she received an infusion on 04/08/2022 and her 2nd injection was done on 04/11/2023.  She had no side effects from her iv reclast .   She had her last reclast  injection on 05/09/2024.  The [facility] center has over interim started her on Tymlos for her osteoporosis.       Past Medical History Past Medical History:  Diagnosis Date   Anxiety disorder    Aortic atherosclerosis (HCC)    Complication of anesthesia 2006   Drop in  Bp intra-op with Lap Chole. [employer].   Depression    Dysfunctional gallbladder    GERD (gastroesophageal reflux disease)    History of colon polyps    IBS (irritable bowel syndrome)    Internal hemorrhoids without complication    Migraine      Allergies Allergies  Allergen Reactions   Zonisamide  Other (See Comments)    Unsure about this medication    Ciprofloxacin  Other (See Comments) and Palpitations   Codeine Nausea Only and Other (See Comments)   Nitrofurantoin Nausea Only   Other Nausea Only     Medications  Current Outpatient Medications:    Abaloparatide (TYMLOS) 3120 MCG/1.56ML SOPN, Inject into the skin., Disp: , Rfl:    dexlansoprazole  (DEXILANT ) 60 MG capsule, Take 1 capsule (60 mg total) by mouth daily., Disp: 90 capsule, Rfl: 4   Galcanezumab-gnlm (EMGALITY [place]), Inject into the skin every 30 (thirty) days. Pt unsure of dosage, Disp: , Rfl:    metoprolol  tartrate (LOPRESSOR ) 25 MG tablet, Take 3 tablets (75 mg total) by mouth 2 (two) times daily., Disp: 540 tablet, Rfl: 3   oxyCODONE  (OXY IR/ROXICODONE ) 5 MG immediate release tablet, Take 1-2 tabs po every 8 hours as needed for pain, Disp: 30 tablet, Rfl: 0   pravastatin  (PRAVACHOL ) 40 MG tablet, Take 1 tablet (40 mg total) by mouth at bedtime., Disp: 90 tablet, Rfl: 3   traZODone  (DESYREL ) 50 MG tablet, TAKE ONE AND ONE-HALF TABLETS BY MOUTH EVERY DAY AT  BEDTIME, Disp: 100 tablet, Rfl: 1   Review of Systems Review of Systems  Constitutional:  Negative for chills and fever.  Eyes:  Negative for blurred vision.  Respiratory:  Negative for shortness of breath.   Cardiovascular:  Negative for chest pain and leg swelling.  Gastrointestinal:  Negative for abdominal pain, constipation, diarrhea, nausea and vomiting.  Musculoskeletal:  Positive for back pain. Negative for falls and myalgias.  Neurological:  Negative for dizziness, weakness and headaches.       Objective:    Vitals BP 128/80   Pulse 61  Temp 97.6 F (36.4 C) (Temporal)   Resp 16   Ht 4' 11 (1.499 m)   Wt 119 lb 12.8 oz (54.3 kg)   SpO2 96%   BMI 24.20 kg/m    Physical Examination Physical Exam Constitutional:      Appearance: Normal appearance. She is not ill-appearing.  Cardiovascular:     Rate and Rhythm: Normal rate and regular rhythm.     Pulses: Normal pulses.     Heart sounds: No murmur heard.    No friction rub. No gallop.  Pulmonary:     Effort: Pulmonary effort is normal. No respiratory distress.     Breath sounds: No wheezing, rhonchi or rales.  Abdominal:     General: Bowel sounds are normal. There is no distension.     Palpations: Abdomen is soft.     Tenderness: There is no abdominal tenderness.  Musculoskeletal:     Right lower leg: No edema.     Left lower leg: No edema.  Skin:    General: Skin is warm and dry.     Findings: No rash.  Neurological:     Mental Status: She is alert.        Assessment & Plan:   Age-related osteoporosis with current pathological fracture She has failed iv reclast  and the spine [facility] center has started her on injected tymlos.  They have referred her to the osteoporosis clinic.  Acute pain I want her to take scheduled tyelnol 1000mg  po TID x 1 week and take oxycodone  5-10mg  po every 8 hours as needed for pain.    Return in about 1 week (around 06/29/2024).   Selinda Fleeta Finger, MD

## 2024-06-22 NOTE — Assessment & Plan Note (Signed)
 I want her to take scheduled tyelnol 1000mg  po TID x 1 week and take oxycodone  5-10mg  po every 8 hours as needed for pain.

## 2024-06-22 NOTE — Assessment & Plan Note (Signed)
 She has failed iv reclast  and the [facility] center has started her on injected tymlos.  They have referred her to the osteoporosis clinic.

## 2024-06-29 ENCOUNTER — Encounter: Payer: Self-pay | Admitting: Internal Medicine

## 2024-06-29 ENCOUNTER — Ambulatory Visit: Admitting: Internal Medicine

## 2024-06-29 VITALS — BP 130/70 | HR 60 | Temp 97.9°F | Resp 16 | Ht 59.0 in | Wt 119.6 lb

## 2024-06-29 DIAGNOSIS — M8000XD Age-related osteoporosis with current pathological fracture, unspecified site, subsequent encounter for fracture with routine healing: Secondary | ICD-10-CM

## 2024-06-29 DIAGNOSIS — R52 Pain, unspecified: Secondary | ICD-10-CM | POA: Diagnosis not present

## 2024-06-29 MED ORDER — OXYCODONE HCL 5 MG PO TABS: ORAL_TABLET | ORAL | 0 refills | Status: AC

## 2024-06-29 NOTE — Progress Notes (Signed)
 Office Visit  Subjective   Patient ID: Rebecca [name]   DOB: May 18, 1946   Age: 78 y.o.   MRN: [id number]   Chief Complaint Chief Complaint  Patient presents with   Follow-up    The pt reports her back is feeling better since being seen in the office last Friday.      History of Present Illness Rebecca [name] returns today for followup on her acute pain where I saw her last week and started her on oxycodone  prn.  She has a known history of osteoporosis as well as a history of scoliosis and thoracic vertebral compression fractures. Three weeks ago, she began mid back pain where she went to the ER.  They did a CTA of the chest  on 06/13/2024 and this showed no evidence of PE, a stable right lung nodule and old compression fractures and kyphoplasties.  They did a MRI of her thoracic spine on 06/13/2024 that showed an acute compression fracture of T7 anterior superior endplate with minimal height loss. Multiple additional chronic compression fracture deformities with most advanced height loss at T5 and T9, with associated exaggerated thoracic kyphosis. No substantial spinal canal or neural foraminal narrowing.  She therefore called the [facility] in [place] whom she saw on 06/19/2024 where she was interested in possible kyphoplasty.  He felt that the pedicle of T7 is very small and they felt it would be challenging to perform a kyphoplasty at this level.  However, considering all of her previous compression fractures, that continued happening without trauma, patient should switch from Reclast  to a medication like Tymlos.  They felt that Tymlos will actually make her fracture heal and also prevent additional fractures.  She was given a sample.  She will be referred to the osteoporosis clinic.  However, the patient states they did not address her pain.  She was given oxycodone  5mg  po every 6 hours prn which she states does not control her pain.  Today, she states the pain is a sharp intermitent  pain rated a 7 out 10 most of the time.  Again, in 2023, she fell where she slipped while it was raining in 09/2022 and was discovered to have a new mild compression fracture of T9.  She went to the ER and they did a CT of her chest which showed a new mild compression fracture at T9 but no acute rib fractures.  She tells me they told her she had rib fractures and I asked her why she didn't come see me after this and she does not know why.  She was sent home in a TLSO brace and followed up with ortho where he recommended conservative treatment due to her history of prior compression fracture and severe osteoporosis.  She was given oxycodone  at that time.  She did go to see ortho in 12/2022 but the patient was told that her compression fracture was healing.  She states she has not had any repeat xrays.  She was followed by the [facility] in [place]. She had an accidental fall in 10/2020 where she slipped on ice and sustained T4 and T5 vertebral fractures.  She did see the pain and scoliosis center where it sounds like they did vertebroplasty on one of her vertebra.  She was placed on flexeril  and ultram at that time.  She states she has had back pain since she was a child and has a history of scoliosis.  She can have back pain when  she stands for a while.  She will have back pain maybe 4 days of the week.  Her back pain can be intermittent and severe at times but this depends on what she does.  Staying on her feet for long periods of time makes it worse.  There is no loss of bowel/bladder function and she denies any new weakness/numbness.  We did a chest xray at that time looking for a rib fracture.  This was done in 02/2021 and showed prior upper thoracic vertebroplasty with multiple thoracic spine compression fractures some which are new compared to her MRI done on 01/09/2021.  She did go see the [facility] for these new compression fractures.  They did not repeat a MRI but she states they  gave her ESI's which did help with her pain.  She states they are going to do a radiofrequency ablation to nerve but this was never done.  I sent her for a bone density which was done in 03/03/2022 and this showed a t-score of -6.1.  We therefore set her up for reclast  injections where she received an infusion on 04/08/2022 and her 2nd injection was done on 04/11/2023.  She had no side effects from her iv reclast .   She had her last reclast  injection on 05/09/2024.  The [facility] center has over interim started her on Tymlos for her osteoporosis.   On her last visit, I asked her to take tylenol  1000mg  TID scheduled for one week and continued her on oxycodone  5mg  to take 1-2 tabs po q 8 hrs prn.  Today, she states that her pain is improved and she will take 1-2 tabs as needed and some days she only takes the oxycodone  once a day and sometimes twice a day.  Her pain has eased off from her last visit.     Past Medical History Past Medical History:  Diagnosis Date   Anxiety disorder    Aortic atherosclerosis (HCC)    Complication of anesthesia 2006   Drop in  Bp intra-op with Lap Chole. [employer].   Depression    Dysfunctional gallbladder    GERD (gastroesophageal reflux disease)    History of colon polyps    IBS (irritable bowel syndrome)    Internal hemorrhoids without complication    Migraine      Allergies Allergies  Allergen Reactions   Zonisamide  Other (See Comments)    Unsure about this medication    Ciprofloxacin  Other (See Comments) and Palpitations   Codeine Nausea Only and Other (See Comments)   Nitrofurantoin Nausea Only   Other Nausea Only     Medications  Current Outpatient Medications:    Abaloparatide (TYMLOS) 3120 MCG/1.56ML SOPN, Inject into the skin., Disp: , Rfl:    dexlansoprazole  (DEXILANT ) 60 MG capsule, Take 1 capsule (60 mg total) by mouth daily., Disp: 90 capsule, Rfl: 4   Galcanezumab-gnlm (EMGALITY [place]), Inject into the skin every 30 (thirty) days.  Pt unsure of dosage, Disp: , Rfl:    metoprolol  tartrate (LOPRESSOR ) 25 MG tablet, Take 3 tablets (75 mg total) by mouth 2 (two) times daily., Disp: 540 tablet, Rfl: 3   oxyCODONE  (OXY IR/ROXICODONE ) 5 MG immediate release tablet, Take 1-2 tabs po every 8 hours as needed for pain, Disp: 30 tablet, Rfl: 0   pravastatin  (PRAVACHOL ) 40 MG tablet, Take 1 tablet (40 mg total) by mouth at bedtime., Disp: 90 tablet, Rfl: 3   traZODone  (DESYREL ) 50 MG tablet, TAKE ONE AND ONE-HALF TABLETS  BY MOUTH EVERY DAY AT BEDTIME, Disp: 100 tablet, Rfl: 1   Review of Systems Review of Systems  Constitutional:  Negative for chills and fever.  Respiratory:  Negative for shortness of breath.   Cardiovascular:  Negative for chest pain.  Gastrointestinal:  Negative for abdominal pain, constipation, diarrhea, nausea and vomiting.  Genitourinary:  Negative for frequency.  Musculoskeletal:  Positive for back pain. Negative for myalgias.  Skin:  Negative for itching and rash.  Neurological:  Negative for dizziness, weakness and headaches.       Objective:    Vitals BP 130/70   Pulse 60   Temp 97.9 F (36.6 C) (Temporal)   Resp 16   Ht 4' 11 (1.499 m)   Wt 119 lb 9.6 oz (54.3 kg)   SpO2 95%   BMI 24.16 kg/m    Physical Examination Physical Exam Constitutional:      Appearance: Normal appearance. She is not ill-appearing.  Cardiovascular:     Rate and Rhythm: Normal rate and regular rhythm.     Pulses: Normal pulses.     Heart sounds: No murmur heard.    No friction rub. No gallop.  Pulmonary:     Effort: Pulmonary effort is normal. No respiratory distress.     Breath sounds: No wheezing, rhonchi or rales.  Abdominal:     General: Bowel sounds are normal. There is no distension.     Palpations: Abdomen is soft.     Tenderness: There is no abdominal tenderness.  Musculoskeletal:     Right lower leg: No edema.     Left lower leg: No edema.  Skin:    General: Skin is warm and dry.     Findings:  No rash.  Neurological:     Mental Status: She is alert.        Assessment & Plan:   Acute pain I have asked her to cut back the tylenol  to take 1000mg  BID scheduled.  We will continue the oxycodone  5-10mg   po q 8 hrs prn.  She can call us  next week on the phone and we can discuss her pain.    Return in about 4 weeks (around 07/27/2024).   Selinda Fleeta Finger, MD

## 2024-06-29 NOTE — Assessment & Plan Note (Signed)
 I have asked her to cut back the tylenol  to take 1000mg  BID scheduled.  We will continue the oxycodone  5-10mg   po q 8 hrs prn.  She can call us  next week on the phone and we can discuss her pain.

## 2024-07-05 DIAGNOSIS — M1711 Unilateral primary osteoarthritis, right knee: Secondary | ICD-10-CM | POA: Diagnosis not present

## 2024-07-10 ENCOUNTER — Ambulatory Visit

## 2024-07-22 NOTE — Progress Notes (Signed)
 [facility] Orthopaedic Spine Surgery Outpatient Visit     Rebecca [name] DOB: 1946-05-19 MRN: [id number] 07/24/2024  Chief Complaint  Patient presents with  . Spine - Pain    PCP: No primary care provider on file. Referring Provider: No ref. provider found  HISTORY:   Rebecca [name] is a 78 y.o.  female patient that is seen as a follow-up evaluation.  Patient is presenting as of acute compression fracture at T7.  Patient has a history of multiple compression fractures at T3, T5, T6.  Patient has a history of chronic mechanical low back pain.  She has a history of a T5 kyphoplasty performed by Dr. Gust in 12/12/2020.  She also suffered a T5 compression fracture after slipping on ice on 10/28/2020. She had MBB's performed by Dr. Darlean 07/28/2021.  She has a past medical history of migraines, GERD, left and right shoulder surgery in 2010 and 2020 respectively.  Physical EXAM: VITALS: BP 127/64   Pulse 63   Resp 18   Ht 4' 11 (1.499 m)   Wt 125 lb (56.7 kg)   SpO2 93%   BMI 25.25 kg/m  BMI Readings from Last 1 Encounters:  07/24/24 25.25 kg/m   Spine Musculoskeletal Exam  Gait   Gait is normal.  Inspection   Leg length disparity: no discrepancy   Thoracolumbar   Erythema: none   Swelling: none   Edema (right lower extremity): none       Prior incision: none  Palpation   Thoracolumbar   Tenderness: present     Paraspinous: right and left  Strength   Thoracolumbar      Right     Tibialis anterior: 5/5.      Tibialis posterior: 5/5.      Plantar flexion: 5/5.      Quadriceps: 5/5.      Hamstring: 5/5.      Hip abductors: 5/5.      Hip flexion: 5/5.      Hip adduction: 5/5.       Left     Tibialis anterior: 5/5.      Tibialis posterior: 5/5.      Plantar flexion: 5/5.      Quadriceps: 5/5.      Hamstring: 5/5.      Hip abductors: 5/5.      Hip flexion: 5/5.      Hip adduction: 5/5.    Sensory   Thoracolumbar      Right     Anterior thigh: normal     Medial thigh: normal     Posterior thigh: normal     Lateral thigh: normal     Anterior knee: normal     Anterior leg: normal     Medial leg: normal     Posterior leg: normal     Lateral leg: normal     Dorsum foot: normal     Lateral foot: normal     Plantar foot: normal     First web space: normal     Left     Anterior thigh: normal     Medial thigh: normal     Posterior thigh: normal     Lateral thigh: normal     Anterior knee: normal     Anterior leg: normal     Medial leg: normal     Posterior leg: normal     Lateral leg: normal     Dorsum foot: normal  Lateral foot: normal     Plantar foot: normal     First web space: normal  Reflexes   Thoracolumbar reflexes are normal.   Right     Quadriceps: 2/4     Achilles: 2/4    Left     Quadriceps: 2/4     Achilles: 2/4  Special Tests   Spine special tests additional comments: Tenderness mid thoracic spine.  No neurologic deficit   IMAGING: Thoracic MRI: Acute compression fracture at T7.     Problem List:   ICD-10-CM   1. Age-related osteoporosis with current pathological fracture of vertebra with routine healing  M80.08XD XR Spine  Thoracic 2 Views       Assessment/Plan:   78 year old female that is seen today as a follow-up evaluation.  The patient was previously seen by Dr. Gust.  He performed a kyphoplasty at T5 on T4/2/22.  Patient is presenting an acute acute compression fracture at T7.  This fracture happened without a fall.  She has a previous history of T3, T5, T6, and now T7 compression fracture.  She has a DEXA scan from 2 years ago of -6.1.  This is severe osteoporosis.  Patient has failed Reclast .  Patient was interested in possible kyphoplasty.  I discussed with the patient that the pedicle of T7 is very small I could be retaining a bit challenging to perform a kyphoplasty at this level.  However, considering all of her previous compression fractures, that  continued happening without trauma, patient should switch from Reclast  to a medication like Tymlos.  Tymlos will actually make her fracture heal and also prevent additional fractures.  She was given a sample.  She will be referred to the osteoporosis clinic. Last visit we referred her to Dr. Lavona with no appointment made. Today we will refer her to Dr. Midge instead.    Xrays today show the fracture at T7 now healing. We will still follow up in 2 months to continue watching this healing phase.   Follow up in about 2 months (around 09/23/2024) for With Dr. Marlyce with xrays.  Orders Placed This Encounter  Procedures  . XR Spine  Thoracic 2 Views    On the day of the encounter, a total of 30 minutes were spent on this patient encounter including history, physical exam, review of historical information, review of imaging, assessment, plan, documentation.   Electronically signed by Donnajean Marlyce, MD on 07/24/2024 at 10:33 AM

## 2024-07-27 ENCOUNTER — Ambulatory Visit: Admitting: Internal Medicine

## 2024-07-27 ENCOUNTER — Encounter: Payer: Self-pay | Admitting: Internal Medicine

## 2024-07-27 VITALS — BP 128/68 | HR 65 | Temp 98.0°F | Resp 16 | Wt 114.2 lb

## 2024-07-27 DIAGNOSIS — M545 Low back pain, unspecified: Secondary | ICD-10-CM

## 2024-07-27 DIAGNOSIS — G43719 Chronic migraine without aura, intractable, without status migrainosus: Secondary | ICD-10-CM | POA: Diagnosis not present

## 2024-07-27 LAB — POCT URINALYSIS DIPSTICK
Bilirubin, UA: NEGATIVE
Blood, UA: NEGATIVE
Glucose, UA: NEGATIVE
Ketones, UA: NEGATIVE
Nitrite, UA: NEGATIVE
Protein, UA: NEGATIVE
Spec Grav, UA: 1.025 (ref 1.010–1.025)
Urobilinogen, UA: 0.2 U/dL
pH, UA: 6 (ref 5.0–8.0)

## 2024-07-27 NOTE — Progress Notes (Signed)
 Office Visit  Subjective   Patient ID: Rebecca [name]   DOB: [birth year]   Age: 78 y.o.   MRN: [id number]   Chief Complaint Chief Complaint  Patient presents with   Follow-up    4 Week follow up     History of Present Illness Rebecca [name] is a 78 yo female who comes in today with complaints of possible kidney stones where she began having left sided flank pain about 3 days ago.  She states this a sharp pain that was initially continuous but it has now eased off.  She states she thinks it is an infection or kidney stone.  She denies any fevers, chills, abdominal pain, nausea or vomiting. She just saw orthopedics on 07/24/2024 for an acute compression fracture at T7. Patient has a history of multiple compression fractures at T3, T5, T6.  Patient has a history of chronic mechanical low back pain.  She has a history of a T5 kyphoplasty performed by Dr. Gust in 12/12/2020.  She also suffered a T5 compression fracture after slipping on ice on 10/28/2020. She had MBB's performed by Dr. Darlean 07/28/2021.  She has a past medical history of migraines, GERD, left and right shoulder surgery in 2010 and 2020 respectively.  She has a previous history of T3, T5, T6, and now T7 compression fracture.  She has a DEXA scan from 2 years ago of -6.1.  This is severe osteoporosis.  Patient has failed Reclast .  However, considering all of her previous compression fractures, that continued happening without trauma, they felt that the patient should switch from Reclast  to a medication like Tymlos.  Tymlos will actually make her fracture heal and also prevent additional fractures.       Past Medical History Past Medical History:  Diagnosis Date   Anxiety disorder    Aortic atherosclerosis (HCC)    Complication of anesthesia 2006   Drop in  Bp intra-op with Lap Chole. [employer].   Depression    Dysfunctional gallbladder    GERD (gastroesophageal reflux disease)    History of colon polyps    IBS (irritable bowel  syndrome)    Internal hemorrhoids without complication    Migraine      Allergies Allergies  Allergen Reactions   Ciprofloxacin  Other (See Comments) and Palpitations   Codeine Nausea Only and Other (See Comments)   Nitrofurantoin Nausea Only   Other Nausea Only     Medications  Current Outpatient Medications:    Abaloparatide (TYMLOS) 3120 MCG/1.56ML SOPN, Inject into the skin., Disp: , Rfl:    dexlansoprazole  (DEXILANT ) 60 MG capsule, Take 1 capsule (60 mg total) by mouth daily., Disp: 90 capsule, Rfl: 4   Galcanezumab-gnlm (EMGALITY [place]), Inject into the skin every 30 (thirty) days. Pt unsure of dosage, Disp: , Rfl:    metoprolol  tartrate (LOPRESSOR ) 25 MG tablet, Take 3 tablets (75 mg total) by mouth 2 (two) times daily., Disp: 540 tablet, Rfl: 3   oxyCODONE  (OXY IR/ROXICODONE ) 5 MG immediate release tablet, Take 1-2 tabs po every 8 hours as needed for pain, Disp: 20 tablet, Rfl: 0   pravastatin  (PRAVACHOL ) 40 MG tablet, Take 1 tablet (40 mg total) by mouth at bedtime., Disp: 90 tablet, Rfl: 3   traZODone  (DESYREL ) 50 MG tablet, TAKE ONE AND ONE-HALF TABLETS BY MOUTH EVERY DAY AT BEDTIME, Disp: 100 tablet, Rfl: 1   Review of Systems Review of Systems  Constitutional:  Negative for chills, fever, malaise/fatigue and weight loss.  Respiratory:  Negative for cough and shortness of breath.   Cardiovascular:  Negative for chest pain, palpitations and leg swelling.  Gastrointestinal:  Negative for abdominal pain, constipation, diarrhea, heartburn, nausea and vomiting.  Musculoskeletal:  Negative for myalgias.  Skin:  Negative for itching and rash.  Neurological:  Negative for dizziness, weakness and headaches.       Objective:    Vitals BP 128/68   Pulse 65   Temp 98 F (36.7 C) (Temporal)   Resp 16   Wt 114 lb 3.2 oz (51.8 kg)   SpO2 96%   BMI 23.07 kg/m    Physical Examination Physical Exam Constitutional:      Appearance: Normal appearance. She is not  ill-appearing.  Cardiovascular:     Rate and Rhythm: Normal rate and regular rhythm.     Pulses: Normal pulses.     Heart sounds: No murmur heard.    No friction rub. No gallop.  Pulmonary:     Effort: Pulmonary effort is normal. No respiratory distress.     Breath sounds: No wheezing, rhonchi or rales.  Abdominal:     General: Bowel sounds are normal. There is no distension.     Palpations: Abdomen is soft.     Tenderness: There is no abdominal tenderness.  Musculoskeletal:     Right lower leg: No edema.     Left lower leg: No edema.  Skin:    General: Skin is warm and dry.     Findings: No rash.  Neurological:     Mental Status: She is alert.        Assessment & Plan:   Low back pain She was concerned she has kidney stones or a UTI.  We did d a UA which was near normal. I think back pain is related to her compression fracture. S he can continue on oxycodone  prn and she has a muscle relaxant.    No follow-ups on file.   Selinda Fleeta Finger, MD

## 2024-07-27 NOTE — Assessment & Plan Note (Signed)
 She was concerned she has kidney stones or a UTI.  We did d a UA which was near normal. I think back pain is related to her compression fracture. S he can continue on oxycodone  prn and she has a muscle relaxant.

## 2024-07-30 ENCOUNTER — Ambulatory Visit
Admission: RE | Admit: 2024-07-30 | Discharge: 2024-07-30 | Disposition: A | Discharge status: Home / Self Care | Source: Ambulatory Visit | Attending: Obstetrics and Gynecology | Admitting: Obstetrics and Gynecology

## 2024-07-30 DIAGNOSIS — Z1231 Encounter for screening mammogram for malignant neoplasm of breast: Secondary | ICD-10-CM

## 2024-08-21 ENCOUNTER — Encounter: Payer: Self-pay | Admitting: Gastroenterology

## 2024-08-24 ENCOUNTER — Ambulatory Visit: Admitting: Internal Medicine

## 2024-08-29 DIAGNOSIS — M8008XA Age-related osteoporosis with current pathological fracture, vertebra(e), initial encounter for fracture: Secondary | ICD-10-CM | POA: Diagnosis not present

## 2024-08-29 DIAGNOSIS — M8000XA Age-related osteoporosis with current pathological fracture, unspecified site, initial encounter for fracture: Secondary | ICD-10-CM | POA: Diagnosis not present

## 2024-08-29 DIAGNOSIS — Z7189 Other specified counseling: Secondary | ICD-10-CM | POA: Diagnosis not present

## 2024-08-31 ENCOUNTER — Ambulatory Visit: Admitting: Internal Medicine

## 2024-08-31 ENCOUNTER — Encounter: Payer: Self-pay | Admitting: Internal Medicine

## 2024-08-31 VITALS — BP 126/78 | HR 65 | Temp 98.3°F | Resp 18 | Ht 59.0 in | Wt 115.0 lb

## 2024-08-31 DIAGNOSIS — Z23 Encounter for immunization: Secondary | ICD-10-CM | POA: Diagnosis not present

## 2024-08-31 DIAGNOSIS — F5101 Primary insomnia: Secondary | ICD-10-CM

## 2024-08-31 DIAGNOSIS — M8000XD Age-related osteoporosis with current pathological fracture, unspecified site, subsequent encounter for fracture with routine healing: Secondary | ICD-10-CM | POA: Diagnosis not present

## 2024-08-31 DIAGNOSIS — I10 Essential (primary) hypertension: Secondary | ICD-10-CM | POA: Diagnosis not present

## 2024-08-31 MED ORDER — TRAZODONE HCL 50 MG PO TABS: 150.0000 mg | ORAL_TABLET | Freq: Every evening | ORAL | 3 refills | Status: AC | PRN

## 2024-08-31 NOTE — Progress Notes (Signed)
 Office Visit  Subjective   [name] ID: Rebecca [name]   DOB: [birth year]   Age: 78 y.o.   MRN: [id number]   Chief Complaint Chief Complaint  [name] presents with   Follow-up     History of Present Illness Rebecca [name] is a 78 yo female who returns for followup of her osteoporosis.  She saw orthopedics on 07/24/2024 for an acute compression fracture at T7. [name] has a history of multiple compression fractures at T3, T5, T6.  [name] has a history of chronic mechanical low back pain.  She has a history of a T5 kyphoplasty performed by Dr. Gust in 12/12/2020.  She also suffered a T5 compression fracture after slipping on ice on 10/28/2020. She had MBB's performed by Dr. Darlean 07/28/2021.  She has a past medical history of migraines, GERD, left and right shoulder surgery in 2010 and 2020 respectively.  She has a previous history of T3, T5, T6, and now T7 compression fracture.  She has a DEXA scan from 2 years ago of -6.1.  This is severe osteoporosis.  [name] has failed Reclast .  However, considering all of her previous compression fractures, that continued happening without trauma, they felt that Rebecca [name] should switch from Reclast  to a medication like Tymlos.  Tymlos will actually make her fracture heal and also prevent additional fractures.  We did refer her to [facility] where she saw Dr. Midge on 08/29/2024.  He recommended continue calcium  and Vit D supplementation as well as weight bearing exercises.  They have ordered another bone density.  They are trying to get her started on Tymlos but they are waiting on insurance approval.  She has not had to use her oxycodone  in Rebecca last 2 weeks as her pain has improved.  Rebecca [name] is a 78 year old Caucasian/White female who presents for a follow-up evaluation of hypertension.  Since her last visit, she has not had any problems.  Her blood pressure was borderline this past year.  Rebecca [name] has been checking her blood  pressure at home. She states her systolic BP runs in Rebecca 120-130's.  Rebecca [name]'s current medications include: metoprolol  75mg  BID. Rebecca [name] has been tolerating her medications well. Rebecca [name] denies any headache, visual changes, dizziness, lightheadness, chest pain, shortness of breath, weakness/numbness, and edema.     Past Medical History Past Medical History:  Diagnosis Date   Anxiety disorder    Aortic atherosclerosis    Complication of anesthesia 2006   Drop in  Bp intra-op with Lap Chole. [facility] [employer].   Depression    Dysfunctional gallbladder    GERD (gastroesophageal reflux disease)    History of colon polyps    IBS (irritable bowel syndrome)    Internal hemorrhoids without complication    Migraine      Allergies Allergies  Allergen Reactions   Ciprofloxacin  Other (See Comments) and Palpitations   Codeine Nausea Only and Other (See Comments)   Nitrofurantoin Nausea Only   Other Nausea Only     Medications  Current Outpatient Medications:    Abaloparatide (TYMLOS) 3120 MCG/1.56ML SOPN, Inject into Rebecca skin., Disp: , Rfl:    dexlansoprazole  (DEXILANT ) 60 MG capsule, Take 1 capsule (60 mg total) by mouth daily., Disp: 90 capsule, Rfl: 4   Galcanezumab-gnlm (EMGALITY [place]), Inject into Rebecca skin every 30 (thirty) days. Pt unsure of dosage, Disp: , Rfl:    metoprolol  tartrate (LOPRESSOR ) 25 MG tablet, Take 3 tablets (75 mg total) by mouth  2 (two) times daily., Disp: 540 tablet, Rfl: 3   oxyCODONE  (OXY IR/ROXICODONE ) 5 MG immediate release tablet, Take 1-2 tabs po every 8 hours as needed for pain ([name] not taking: Reported on 08/31/2024), Disp: 20 tablet, Rfl: 0   pravastatin  (PRAVACHOL ) 40 MG tablet, Take 1 tablet (40 mg total) by mouth at bedtime., Disp: 90 tablet, Rfl: 3   traZODone  (DESYREL ) 50 MG tablet, TAKE ONE AND ONE-HALF TABLETS BY MOUTH EVERY DAY AT BEDTIME, Disp: 100 tablet, Rfl: 1   Review of Systems Review of Systems  Constitutional:  Negative for  chills and fever.  Eyes:  Negative for blurred vision and double vision.  Respiratory:  Negative for cough and shortness of breath.   Cardiovascular:  Negative for chest pain, palpitations and leg swelling.  Gastrointestinal:  Negative for abdominal pain, constipation, diarrhea, heartburn, nausea and vomiting.  Musculoskeletal:  Negative for falls and myalgias.  Neurological:  Negative for dizziness, weakness and headaches.       Objective:    Vitals BP 126/78 (BP Location: Left Arm, [name] Position: Sitting, Cuff Size: Normal)   Pulse 65   Temp 98.3 F (36.8 C)   Resp 18   Ht 4' 11 (1.499 m)   Wt 115 lb (52.2 kg)   SpO2 96%   BMI 23.23 kg/m    Physical Examination Physical Exam Constitutional:      Appearance: Normal appearance. She is not ill-appearing.  Cardiovascular:     Rate and Rhythm: Normal rate and regular rhythm.     Pulses: Normal pulses.     Heart sounds: No murmur heard.    No friction rub. No gallop.  Pulmonary:     Effort: Pulmonary effort is normal. No respiratory distress.     Breath sounds: No wheezing, rhonchi or rales.  Abdominal:     General: Bowel sounds are normal. There is no distension.     Palpations: Abdomen is soft.     Tenderness: There is no abdominal tenderness.  Musculoskeletal:     Right lower leg: No edema.     Left lower leg: No edema.  Skin:    General: Skin is warm and dry.     Findings: No rash.  Neurological:     Mental Status: She is alert.        Assessment & Plan:   Essential hypertension Her BP is doing well.  We will continue her current BP meds.  Age-related osteoporosis with current pathological fracture She is followed by [facility] at [facility] and they are working on getting her on tylmos.  Insomnia She has been taking trazodone  100mg  at bedtime prn but this has not helped.  She can increase her trazodone  to 150mg  at bedtime prn.    No follow-ups on file.   Selinda Fleeta Finger, MD

## 2024-08-31 NOTE — Assessment & Plan Note (Signed)
 Her BP is doing well.  We will continue her current BP meds.

## 2024-08-31 NOTE — Assessment & Plan Note (Signed)
 She is followed by the osteoporosis clinic at [facility] and they are working on getting her on tylmos.

## 2024-08-31 NOTE — Assessment & Plan Note (Signed)
 She has been taking trazodone  100mg  at bedtime prn but this has not helped.  She can increase her trazodone  to 150mg  at bedtime prn.

## 2024-09-03 NOTE — Addendum Note (Signed)
 Addended by: LENETTA LACKS on: 09/03/2024 11:48 AM   Modules accepted: Orders

## 2024-09-25 DIAGNOSIS — M8008XD Age-related osteoporosis with current pathological fracture, vertebra(e), subsequent encounter for fracture with routine healing: Secondary | ICD-10-CM | POA: Diagnosis not present

## 2024-09-25 DIAGNOSIS — M810 Age-related osteoporosis without current pathological fracture: Secondary | ICD-10-CM | POA: Diagnosis not present

## 2024-11-28 ENCOUNTER — Ambulatory Visit: Admitting: Internal Medicine

## 2024-12-07 ENCOUNTER — Ambulatory Visit

## 2024-12-07 VITALS — BP 110/78 | HR 78 | Temp 98.1°F | Resp 16 | Ht 59.0 in | Wt 107.0 lb

## 2024-12-07 DIAGNOSIS — Z9189 Other specified personal risk factors, not elsewhere classified: Secondary | ICD-10-CM

## 2024-12-07 DIAGNOSIS — R630 Anorexia: Secondary | ICD-10-CM | POA: Insufficient documentation

## 2024-12-07 DIAGNOSIS — R634 Abnormal weight loss: Secondary | ICD-10-CM

## 2024-12-07 MED ORDER — MIRTAZAPINE 15 MG PO TABS: 7.5000 mg | ORAL_TABLET | Freq: Every day | ORAL | 2 refills | Status: AC

## 2024-12-11 ENCOUNTER — Ambulatory Visit

## 2024-12-11 DIAGNOSIS — Z9189 Other specified personal risk factors, not elsewhere classified: Secondary | ICD-10-CM | POA: Insufficient documentation

## 2024-12-11 DIAGNOSIS — R630 Anorexia: Secondary | ICD-10-CM | POA: Insufficient documentation

## 2024-12-11 NOTE — Assessment & Plan Note (Signed)
 Recommend Ensure or other high-calorie nutritional supplements daily. Encourage small, frequent meals and nutrient-dense foods.

## 2024-12-11 NOTE — Assessment & Plan Note (Signed)
 Age >5 Documented >5% unintentional weight loss in 3 months Decreased oral intake / poor appetite Potential for frailty and associated increased risk of morbidity  Remeron  (mirtazapine ) 7.5 mg PO at bedtime for appetite stimulation.

## 2024-12-11 NOTE — Assessment & Plan Note (Signed)
 Remeron  (mirtazapine ) 7.5 mg PO at bedtime for appetite stimulation.

## 2024-12-12 ENCOUNTER — Ambulatory Visit

## 2024-12-12 VITALS — BP 126/88 | HR 71 | Temp 98.3°F | Resp 18 | Ht 59.0 in | Wt 105.4 lb

## 2024-12-12 DIAGNOSIS — R739 Hyperglycemia, unspecified: Secondary | ICD-10-CM | POA: Insufficient documentation

## 2024-12-12 DIAGNOSIS — Z713 Dietary counseling and surveillance: Secondary | ICD-10-CM | POA: Insufficient documentation

## 2024-12-12 NOTE — Progress Notes (Signed)
" °  Patient: Rebecca [name] DOB: 12/08/1945 Age: 79 Date of Visit: 12/12/2024 Allergies: Ciprofloxacin , Codeine, Nitrofurantoin  Subjective 79 year old female presents reporting concern for elevated blood sugar. States she felt shaky and checked her blood glucose for the first time, which was 183 mg/dL. She felt it was important to notify her provider. Denies: Polyuria, polydipsia, polyphagia Blurred vision Fatigue Fever, cough, or other signs of infection No symptoms of dehydration reported.  Objective Vital Signs: BP: 126/88 mmHg Pulse: 71 bpm Temp: 98.1F RR: 18 SpO?: 99% on room air Weight: 105 lb Height: 4'11  General: Alert, oriented, no acute distress Hydration: Appears euvolemic Infectious signs: None observed  Assessment Elevated blood glucose, isolated reading Single non-fasting value of 183 mg/dL No symptoms suggestive of hyperglycemia or diabetes complications No evidence of acute illness contributing to hyperglycemia Shakiness, resolved No ongoing symptoms at time of evaluation  Plan Education provided regarding: Dietary intake and carbohydrate moderation Importance of consistent timing of glucose checks Advised patient to monitor blood glucose and keep a written log Instructed to bring glucose log to next visit Reviewed signs and symptoms that should prompt earlier evaluation: Persistent hyperglycemia Polyuria, polydipsia, polyphagia Dizziness, confusion, weakness No medications initiated at this time Follow-up as scheduled or sooner if symptoms develop  Jon Bihari, NP "

## 2024-12-12 NOTE — Assessment & Plan Note (Signed)
 Education provided regarding: Dietary intake and carbohydrate moderation Importance of consistent timing of glucose checks

## 2024-12-12 NOTE — Assessment & Plan Note (Signed)
 Elevated blood glucose, isolated reading Single non-fasting value of 183 mg/dL No symptoms suggestive of hyperglycemia or diabetes complications No evidence of acute illness contributing to hyperglycemia

## 2024-12-19 ENCOUNTER — Ambulatory Visit: Admitting: Gastroenterology

## 2025-01-04 ENCOUNTER — Ambulatory Visit
